# Patient Record
Sex: Female | Born: 1955 | Race: White | Hispanic: No | Marital: Married | Smoking: Never smoker
Health system: Southern US, Community
[De-identification: ages and names within clinical notes are randomized; demographics above are authoritative.]

## PROBLEM LIST (undated history)

## (undated) DIAGNOSIS — F32A Depression, unspecified: Secondary | ICD-10-CM

## (undated) DIAGNOSIS — J449 Chronic obstructive pulmonary disease, unspecified: Secondary | ICD-10-CM

## (undated) DIAGNOSIS — K760 Fatty (change of) liver, not elsewhere classified: Secondary | ICD-10-CM

## (undated) DIAGNOSIS — I10 Essential (primary) hypertension: Secondary | ICD-10-CM

## (undated) DIAGNOSIS — E7800 Pure hypercholesterolemia, unspecified: Secondary | ICD-10-CM

## (undated) DIAGNOSIS — K219 Gastro-esophageal reflux disease without esophagitis: Secondary | ICD-10-CM

## (undated) DIAGNOSIS — M1990 Unspecified osteoarthritis, unspecified site: Secondary | ICD-10-CM

## (undated) DIAGNOSIS — E119 Type 2 diabetes mellitus without complications: Secondary | ICD-10-CM

## (undated) DIAGNOSIS — D649 Anemia, unspecified: Secondary | ICD-10-CM

## (undated) DIAGNOSIS — N1832 Chronic kidney disease, stage 3b: Secondary | ICD-10-CM

## (undated) DIAGNOSIS — R0600 Dyspnea, unspecified: Secondary | ICD-10-CM

## (undated) DIAGNOSIS — R519 Headache, unspecified: Secondary | ICD-10-CM

## (undated) DIAGNOSIS — T7840XA Allergy, unspecified, initial encounter: Secondary | ICD-10-CM

## (undated) DIAGNOSIS — F419 Anxiety disorder, unspecified: Secondary | ICD-10-CM

## (undated) DIAGNOSIS — G4730 Sleep apnea, unspecified: Secondary | ICD-10-CM

## (undated) DIAGNOSIS — R740 Nonspecific elevation of levels of transaminase and lactic acid dehydrogenase [LDH]: Secondary | ICD-10-CM

## (undated) DIAGNOSIS — G4733 Obstructive sleep apnea (adult) (pediatric): Secondary | ICD-10-CM

## (undated) DIAGNOSIS — H269 Unspecified cataract: Secondary | ICD-10-CM

## (undated) DIAGNOSIS — I5189 Other ill-defined heart diseases: Secondary | ICD-10-CM

## (undated) DIAGNOSIS — R748 Abnormal levels of other serum enzymes: Secondary | ICD-10-CM

## (undated) DIAGNOSIS — F329 Major depressive disorder, single episode, unspecified: Secondary | ICD-10-CM

## (undated) HISTORY — PX: TONSILLECTOMY: SUR1361

## (undated) HISTORY — DX: Fatty (change of) liver, not elsewhere classified: K76.0

## (undated) HISTORY — DX: Pure hypercholesterolemia, unspecified: E78.00

## (undated) HISTORY — PX: REPLACEMENT TOTAL KNEE: SUR1224

## (undated) HISTORY — DX: Unspecified osteoarthritis, unspecified site: M19.90

## (undated) HISTORY — DX: Gastro-esophageal reflux disease without esophagitis: K21.9

## (undated) HISTORY — DX: Anxiety disorder, unspecified: F41.9

## (undated) HISTORY — DX: Unspecified cataract: H26.9

## (undated) HISTORY — DX: Chronic obstructive pulmonary disease, unspecified: J44.9

## (undated) HISTORY — DX: Essential (primary) hypertension: I10

## (undated) HISTORY — PX: CARDIAC CATHETERIZATION: SHX172

## (undated) HISTORY — DX: Abnormal levels of other serum enzymes: R74.8

## (undated) HISTORY — PX: TOTAL ABDOMINAL HYSTERECTOMY W/ BILATERAL SALPINGOOPHORECTOMY: SHX83

## (undated) HISTORY — PX: KNEE SURGERY: SHX244

## (undated) HISTORY — PX: TUBAL LIGATION: SHX77

## (undated) HISTORY — DX: Allergy, unspecified, initial encounter: T78.40XA

## (undated) HISTORY — DX: Depression, unspecified: F32.A

## (undated) HISTORY — DX: Sleep apnea, unspecified: G47.30

## (undated) HISTORY — DX: Nonspecific elevation of levels of transaminase and lactic acid dehydrogenase (ldh): R74.0

## (undated) HISTORY — PX: FRACTURE SURGERY: SHX138

## (undated) HISTORY — DX: Major depressive disorder, single episode, unspecified: F32.9

## (undated) HISTORY — PX: ABDOMINAL HYSTERECTOMY: SHX81

---

## 1988-09-25 HISTORY — PX: FRACTURE SURGERY: SHX138

## 2005-07-11 ENCOUNTER — Ambulatory Visit: Payer: Self-pay | Admitting: Internal Medicine

## 2008-08-14 LAB — HM COLONOSCOPY

## 2010-08-24 ENCOUNTER — Ambulatory Visit: Payer: Self-pay | Admitting: Neurology

## 2010-10-04 ENCOUNTER — Ambulatory Visit: Payer: Self-pay | Admitting: Neurology

## 2010-12-05 ENCOUNTER — Ambulatory Visit: Payer: Self-pay | Admitting: Family Medicine

## 2010-12-06 ENCOUNTER — Ambulatory Visit: Payer: Self-pay | Admitting: Nephrology

## 2010-12-25 ENCOUNTER — Ambulatory Visit: Payer: Self-pay | Admitting: Family Medicine

## 2012-03-05 DIAGNOSIS — G2581 Restless legs syndrome: Secondary | ICD-10-CM | POA: Insufficient documentation

## 2012-08-27 DIAGNOSIS — E1169 Type 2 diabetes mellitus with other specified complication: Secondary | ICD-10-CM | POA: Insufficient documentation

## 2012-08-27 DIAGNOSIS — IMO0002 Reserved for concepts with insufficient information to code with codable children: Secondary | ICD-10-CM

## 2012-08-27 DIAGNOSIS — E785 Hyperlipidemia, unspecified: Secondary | ICD-10-CM | POA: Insufficient documentation

## 2012-08-27 HISTORY — DX: Reserved for concepts with insufficient information to code with codable children: IMO0002

## 2012-09-02 ENCOUNTER — Ambulatory Visit: Payer: Self-pay | Admitting: Nurse Practitioner

## 2012-09-04 DIAGNOSIS — K7581 Nonalcoholic steatohepatitis (NASH): Secondary | ICD-10-CM | POA: Insufficient documentation

## 2012-10-07 ENCOUNTER — Other Ambulatory Visit: Payer: Self-pay

## 2012-10-07 LAB — CBC WITH DIFFERENTIAL/PLATELET
Basophil #: 0.1 10*3/uL (ref 0.0–0.1)
Eosinophil #: 0.3 10*3/uL (ref 0.0–0.7)
HCT: 42.3 % (ref 35.0–47.0)
HGB: 13.7 g/dL (ref 12.0–16.0)
Lymphocyte #: 1.6 10*3/uL (ref 1.0–3.6)
Lymphocyte %: 12.5 %
MCH: 29 pg (ref 26.0–34.0)
MCHC: 32.3 g/dL (ref 32.0–36.0)
MCV: 90 fL (ref 80–100)
Monocyte #: 0.6 x10 3/mm (ref 0.2–0.9)
Monocyte %: 4.8 %
Neutrophil #: 10.3 10*3/uL — ABNORMAL HIGH (ref 1.4–6.5)
Neutrophil %: 79.4 %
Platelet: 255 10*3/uL (ref 150–440)
RBC: 4.71 10*6/uL (ref 3.80–5.20)
WBC: 13 10*3/uL — ABNORMAL HIGH (ref 3.6–11.0)

## 2012-10-09 ENCOUNTER — Ambulatory Visit: Payer: Self-pay | Admitting: Nurse Practitioner

## 2012-12-09 ENCOUNTER — Ambulatory Visit: Payer: Self-pay | Admitting: Specialist

## 2012-12-17 ENCOUNTER — Ambulatory Visit: Payer: Self-pay | Admitting: Internal Medicine

## 2013-01-14 ENCOUNTER — Ambulatory Visit: Payer: Self-pay | Admitting: Specialist

## 2013-03-25 DIAGNOSIS — I10 Essential (primary) hypertension: Secondary | ICD-10-CM | POA: Insufficient documentation

## 2013-03-25 DIAGNOSIS — J449 Chronic obstructive pulmonary disease, unspecified: Secondary | ICD-10-CM | POA: Insufficient documentation

## 2013-03-25 DIAGNOSIS — E118 Type 2 diabetes mellitus with unspecified complications: Secondary | ICD-10-CM | POA: Insufficient documentation

## 2013-05-11 ENCOUNTER — Emergency Department: Payer: Self-pay | Admitting: Emergency Medicine

## 2013-05-11 LAB — BASIC METABOLIC PANEL
Anion Gap: 5 — ABNORMAL LOW (ref 7–16)
Calcium, Total: 9.4 mg/dL (ref 8.5–10.1)
Chloride: 105 mmol/L (ref 98–107)
Co2: 29 mmol/L (ref 21–32)
EGFR (African American): >60
EGFR (Non-African Amer.): >60
Osmolality: 281 (ref 275–301)
Potassium: 4.6 mmol/L (ref 3.5–5.1)
Sodium: 139 mmol/L (ref 136–145)

## 2013-05-11 LAB — CBC
HCT: 39.9 % (ref 35.0–47.0)
MCV: 89 fL (ref 80–100)
RBC: 4.49 10*6/uL (ref 3.80–5.20)
RDW: 14.1 % (ref 11.5–14.5)

## 2013-06-16 DIAGNOSIS — F324 Major depressive disorder, single episode, in partial remission: Secondary | ICD-10-CM | POA: Insufficient documentation

## 2014-06-25 ENCOUNTER — Ambulatory Visit: Payer: Self-pay | Admitting: Cardiology

## 2014-07-03 ENCOUNTER — Ambulatory Visit: Payer: Self-pay | Admitting: Specialist

## 2014-07-03 ENCOUNTER — Ambulatory Visit: Payer: Self-pay | Admitting: Cardiology

## 2014-07-15 ENCOUNTER — Ambulatory Visit: Payer: Self-pay | Admitting: Cardiology

## 2014-07-31 DIAGNOSIS — Z9889 Other specified postprocedural states: Secondary | ICD-10-CM | POA: Insufficient documentation

## 2014-09-03 DIAGNOSIS — R748 Abnormal levels of other serum enzymes: Secondary | ICD-10-CM

## 2014-09-03 HISTORY — DX: Abnormal levels of other serum enzymes: R74.8

## 2014-09-30 ENCOUNTER — Ambulatory Visit: Payer: Self-pay | Admitting: Internal Medicine

## 2014-12-09 DIAGNOSIS — N183 Chronic kidney disease, stage 3 unspecified: Secondary | ICD-10-CM | POA: Insufficient documentation

## 2014-12-16 ENCOUNTER — Ambulatory Visit: Payer: Self-pay | Admitting: Neurology

## 2014-12-30 DIAGNOSIS — G629 Polyneuropathy, unspecified: Secondary | ICD-10-CM | POA: Insufficient documentation

## 2015-01-15 NOTE — Op Note (Signed)
PATIENT NAME:  Angela Mullen, Angela Mullen MR#:  [id number] DATE OF BIRTH:  [birth year]  DATE OF PROCEDURE:  01/14/2013  PREOPERATIVE DIAGNOSES: 1.  Medial meniscus tear, left knee.  2.  Degenerative arthritis, medial compartment, left knee.   POSTOPERATIVE DIAGNOSES: 1.  Medial meniscus tear, left knee.  2.  Degenerative arthritis, medial compartment, left knee.  3.  Cartilaginous loose body intercondylar notch.   PROCEDURE:  Arthroscopic partial medial meniscectomy, left knee.   SURGEON:  Christophe Louis, M.D.   ANESTHESIA:  General.   COMPLICATIONS:  None.   PROCEDURE:  After adequate induction of general anesthesia, the left lower leg is placed in the legholder in the usual manner for arthroscopy.  The left knee and leg are thoroughly prepped with alcohol and ChloraPrep and draped in standard sterile fashion.  Diagnostic arthroscopy is performed after infiltrating the knee with 10 mL of Marcaine with epinephrine.  There is seen to be a joint effusion present.  The patellofemoral articulation is within normal limits for her age.  There is minimal chondromalacia.  At the margin of the medial femoral condyle there is a hypertrophic bone spur consistent with degenerative arthritis.  In the medial compartment, there is a moderate chondromalacia of the medial femoral condyle associated with a radial tear in the posterior horn of the medial meniscus.  Using a combination of the full radial resector and the ArthroWand, the torn portion of the meniscus was resected posteriorly.  In the intercondylar notch, there was a moderate amount of synovial hypertrophy along with an 8 mm in diameter cartilaginous loose body.  Using the full resector and the ArthroWand this was all removed.  The anterior cruciate ligament is seen to be normal.  The lateral compartment is normal.  The joint is thoroughly irrigated multiple times.  Skin edges are closed with 5-0 nylon.  The joint is infiltrated with 10 mL of Marcaine, 4 mg of  morphine and 1 mL of Depo-Medrol.  A soft bulky dressing is applied.  The patient is returned to the recovery room in satisfactory condition having tolerated the procedure quite well.    ____________________________ Lucas Mallow, MD ces:ea D: 01/15/2013 06:08:58 ET T: 01/15/2013 06:16:57 ET JOB#: [id number]  cc: Lucas Mallow, MD, <Dictator> Lucas Mallow MD ELECTRONICALLY SIGNED 01/17/2013 8:34

## 2015-07-14 ENCOUNTER — Ambulatory Visit: Payer: Self-pay | Admitting: Psychiatry

## 2015-08-19 DIAGNOSIS — M159 Polyosteoarthritis, unspecified: Secondary | ICD-10-CM | POA: Insufficient documentation

## 2015-11-09 ENCOUNTER — Other Ambulatory Visit: Payer: Self-pay | Admitting: Internal Medicine

## 2015-11-09 DIAGNOSIS — R748 Abnormal levels of other serum enzymes: Secondary | ICD-10-CM

## 2015-11-09 DIAGNOSIS — G4700 Insomnia, unspecified: Secondary | ICD-10-CM | POA: Insufficient documentation

## 2015-11-15 ENCOUNTER — Ambulatory Visit
Admission: RE | Admit: 2015-11-15 | Discharge: 2015-11-15 | Disposition: A | Discharge status: Home / Self Care | Payer: 59 | Source: Ambulatory Visit | Attending: Internal Medicine | Admitting: Internal Medicine

## 2015-11-15 DIAGNOSIS — R748 Abnormal levels of other serum enzymes: Secondary | ICD-10-CM | POA: Diagnosis present

## 2015-11-15 DIAGNOSIS — K760 Fatty (change of) liver, not elsewhere classified: Secondary | ICD-10-CM | POA: Diagnosis not present

## 2015-12-17 ENCOUNTER — Ambulatory Visit: Payer: 59 | Admitting: Licensed Clinical Social Worker

## 2015-12-29 ENCOUNTER — Encounter: Payer: Self-pay | Admitting: Licensed Clinical Social Worker

## 2015-12-29 ENCOUNTER — Other Ambulatory Visit: Payer: Self-pay

## 2015-12-29 ENCOUNTER — Ambulatory Visit (INDEPENDENT_AMBULATORY_CARE_PROVIDER_SITE_OTHER): Payer: 59 | Admitting: Licensed Clinical Social Worker

## 2015-12-29 DIAGNOSIS — F411 Generalized anxiety disorder: Secondary | ICD-10-CM | POA: Diagnosis not present

## 2015-12-29 DIAGNOSIS — G4733 Obstructive sleep apnea (adult) (pediatric): Secondary | ICD-10-CM | POA: Insufficient documentation

## 2015-12-29 DIAGNOSIS — F332 Major depressive disorder, recurrent severe without psychotic features: Secondary | ICD-10-CM | POA: Diagnosis not present

## 2015-12-29 DIAGNOSIS — M1990 Unspecified osteoarthritis, unspecified site: Secondary | ICD-10-CM | POA: Insufficient documentation

## 2015-12-29 DIAGNOSIS — K219 Gastro-esophageal reflux disease without esophagitis: Secondary | ICD-10-CM | POA: Insufficient documentation

## 2015-12-29 NOTE — Progress Notes (Signed)
Comprehensive Clinical Assessment (CCA) Note  12/29/2015 [name] [id number]  Visit Diagnosis:      ICD-9-CM ICD-10-CM   1. Severe episode of recurrent major depressive disorder, without psychotic features ([place]) 296.33 F33.2   2. Generalized anxiety disorder 300.02 F41.1       CCA Part One  Part One has been completed on paper by the patient.  (See scanned document in Chart Review)  CCA Part Two A  Intake/Chief Complaint:  CCA Intake With Chief Complaint CCA Part Two Date: 12/29/15 CCA Part Two Time: 0838 Chief Complaint/Presenting Problem: Periodically, through her life she has dealt with depression. This past November she lost her sister a year before. She is the youngest and a large gap between her and older siblings. her older sister was like a mother to her. when she passed she went through the motions. She hadn't grieved but it started to affect her last July. About September and October she couldn't get out of bed and crying. She would try to leave and have a panic attack and couldn't get out the door. She was put on Zoloft, Dr. Lucio Edward added the Wellbutrin and it seemed to help. Amonst this she put on 40-50 punds. She is waking up at night at 2-3 AM. Vyvanse was given and iit helps with the binge eating . Dr. Nicolasa Ducking gave it to her.  Patients Currently Reported Symptoms/Problems: She wants to cry, sad and not sure why crying.  Collateral Involvement: husband and son Individual's Strengths: right now she doesn't feel like she has any. Therapist pointed out that she is resilient to handle all these losses. she said she is better than December. She is a little more active, but she doesn't want to go out. When she does she gets tearful and have to leave.  Individual's Preferences: Counseling, medication management Individual's Abilities: Used to like gardening, to read, like to go to the beach, she loves her grandchildren, she has eight grandchildren.  Type of Services Patient Feels  Are Needed: counseling for grief and depression.  Initial Clinical Notes/Concerns: The loss of mom and dad. She thinks that it hit her all the same time. She lost her brother in [place]. Her mom had a cerebral hemorrage. "All of these losses have been unexpected". Her psych history-She started to see Dr. Nicolasa Ducking in December, 2016. She started getting antidepressants from her PCP for about 20 years. She would go back on and off. Prozac, Wellbutrin, Amitripaline.   Mental Health Symptoms Depression:  Depression: Change in energy/activity, Difficulty Concentrating, Fatigue, Hopelessness, Increase/decrease in appetite, Irritability, Tearfulness, Weight gain/loss, Sleep (too much or little), Worthlessness (She didn't want to get out of bed, still waking up. she is sleeping. she is having dreams, wakes up and seem real and stay with her for a couple of days. denies SI, she questions why she is here, denies past SA, SIB. )  Mania:  Mania:  (phases of increased energy, it it like that once a month)  Anxiety:   Anxiety: Difficulty concentrating, Fatigue, Irritability, Restlessness, Sleep, Tension, Worrying (Worry daily, everything, inferes with functioning. Panic attacks-break out crying out of nowhere and can't stop)  Psychosis:  Psychosis:  (She thinks something flashes across her sight and she thinks she saw something. )  Trauma:  Trauma: N/A  Obsessions:  Obsessions: N/A  Compulsions:  Compulsions: N/A  Inattention:  Inattention: N/A  Hyperactivity/Impulsivity:  Hyperactivity/Impulsivity: N/A  Oppositional/Defiant Behaviors:  Oppositional/Defiant Behaviors: N/A  Borderline Personality:  Emotional Irregularity: N/A  Other  Mood/Personality Symptoms:  Other Mood/Personality Symtpoms: she cycles monthly feeling more depressed related to when she had her period even though she had a hysterectomy.    Mental Status Exam Appearance and self-care  Stature:  Stature: Average  Weight:  Weight: Obese  Clothing:   Clothing: Casual  Grooming:  Grooming: Normal  Cosmetic use:  Cosmetic Use: None  Posture/gait:  Posture/Gait: Normal  Motor activity:  Motor Activity: Not Remarkable  Sensorium  Attention:  Attention: Normal  Concentration:  Concentration: Normal  Orientation:  Orientation: Person, Object, Place, Situation, Time  Recall/memory:  Recall/Memory: Normal  Affect and Mood  Affect:  Affect: Depressed  Mood:  Mood: Depressed  Relating  Eye contact:  Eye Contact: Normal  Facial expression:  Facial Expression: Depressed, Sad  Attitude toward examiner:  Attitude Toward Examiner: Cooperative  Thought and Language  Speech flow: Speech Flow: Normal  Thought content:  Thought Content: Appropriate to mood and circumstances  Preoccupation:     Hallucinations:     Organization:     Transport planner of Knowledge:  Fund of Knowledge: Average  Intelligence:  Intelligence: Average  Abstraction:  Abstraction: Normal  Judgement:  Judgement: Fair  Art therapist:  Reality Testing: Realistic  Insight:  Insight: Fair  Decision Making:  Decision Making: Paralyzed  Social Functioning  Social Maturity:  Social Maturity: Responsible, Isolates  Social Judgement:  Social Judgement: Normal  Stress  Stressors:  Stressors: Grief/losses  Coping Ability:  Coping Ability: Deficient supports, Theatre stage manager, English as a second language teacher Deficits:     Supports:      Family and Psychosocial History: Family history Marital status: Married Number of Years Married: 66 What types of issues is patient dealing with in the relationship?: He is supportive right now but he wasn't before. The way he looks at things. He tells him things and it doesn't seem like it registers. He thinks that what he thinks other people should think. She looked at things from his perspective but now realizing she needs to have own perspective. She wants people to listen to her and respect her opinion.   Are you sexually active?: No What is your  sexual orientation?: heterosexual Has your sexual activity been affected by drugs, alcohol, medication, or emotional stress?: emotional stress Does patient have children?: Yes How many children?: 1 How is patient's relationship with their children?: Son is 74 and two stepsons who are 61 and 63. Jason-49 stressed, Robbie 46-good kid, with her son they have had their ups and downs mostly from daughter-in-law.    Childhood History:  Childhood History By whom was/is the patient raised?: Both parents Additional childhood history information: She had a good childhood. She lost her mom at 78.  Description of patient's relationship with caregiver when they were a child: She was the baby. She felt like they did more with parents than brothers and sisters because patient was so much younger than older brothers and sisters.  Patient's description of current relationship with people who raised him/her: her mom and dad passed. The only person left is her brother  How were you disciplined when you got in trouble as a child/adolescent?:  (She was called by her middle name. Her mom got her switch. Her dad never whooped her. She didn't get in a lot of trouble. ) Does patient have siblings?: Yes Number of Siblings: 3 Description of patient's current relationship with siblings: 1 surviving brother, he has been there but he has his own problems. this is one of her  worries. She lost one brother and sister. Her brothers were twins.  Did patient suffer any verbal/emotional/physical/sexual abuse as a child?: No Did patient suffer from severe childhood neglect?: No Has patient ever been sexually abused/assaulted/raped as an adolescent or adult?: No Was the patient ever a victim of a crime or a disaster?: No Witnessed domestic violence?: No Has patient been effected by domestic violence as an adult?: No  CCA Part Two B  Employment/Work Situation: Employment / Work Situation Employment situation: Unemployed (Past  ten years she hasn't worked. She kept the grandchildren, helping out with the kids. 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 ) Patient's job has been impacted by current illness: Yes (Up to last year active, but this year hasn't done the things and hasn't been there for them. ) What is the longest time patient has a held a job?: 7 Where was the patient employed at that time?:  Day care, data entry shipping clerk-[employer], [employer], [employer] Has patient ever been in the [employer]?: No Has patient ever served in combat?: No Did You Receive Any Psychiatric Treatment/Services While in Passenger transport manager?: No Are There Guns or Other Weapons in [place]?: Yes Types of Guns/Weapons: handgun Are These Psychologist, educational?: Yes  Education: Education School Currently Attending: no Last Grade Completed: 13.5 Name of [place]: [place] Did Teacher, adult education From [employer]?: Yes Did [place]?: Yes (a year and a half of college) What Type of College Degree Do you Have?: certificate-sauder Did [place]?: No Did You Have Any Special Interests In School?: accountant Did You Have An Individualized Education Program (IIEP): No Did You Have Any Difficulty At School?: No  Religion: Religion/Spirituality Are You A Religious Person?: Yes What is Your Religious Affiliation?: Christian How Might This Affect Treatment?: none  Leisure/Recreation: Leisure / Recreation Leisure and Hobbies: gardening, reading, likes the beach  Exercise/Diet: Exercise/Diet Do You Exercise?: No Have You Gained or Lost A Significant Amount of Weight in the Past Six Months?: Yes-Gained Number of Pounds Gained: 30 Do You Follow a Special Diet?: No Type of Diet: cut back on carbs, drink more water, stay away from soda, starting having a smoothie and then having a meal. Try to eat more fruit.  Do You Have Any Trouble Sleeping?: Yes Explanation  of Sleeping Difficulties: waking up in the night, nightmares, sometimes she has trouble getting to sleep, Sleep Apnea  CCA Part Two C  Alcohol/Drug Use: Alcohol / Drug Use Pain Medications: see med list-Methocarbamol, Prescriptions: see med list Over the Counter: see med list History of alcohol / drug use?: No history of alcohol / drug abuse                      CCA Part Three  ASAM's:  Six Dimensions of Multidimensional Assessment  Dimension 1:  Acute Intoxication and/or Withdrawal Potential:     Dimension 2:  Biomedical Conditions and Complications:     Dimension 3:  Emotional, Behavioral, or Cognitive Conditions and Complications:     Dimension 4:  Readiness to Change:     Dimension 5:  Relapse, Continued use, or Continued Problem Potential:     Dimension 6:  Recovery/Living Environment:      Substance use Disorder (SUD)    Social Function:  Social Functioning Social Maturity: Responsible, Isolates Social Judgement: Normal  Stress:  Stress Stressors: Grief/losses Coping Ability: Deficient  supports, Exhausted, Overwhelmed Patient Takes Medications The Way The Doctor Instructed?: Yes Priority Risk: Low Acuity  Risk Assessment- Self-Harm Potential: Risk Assessment For Self-Harm Potential Thoughts of Self-Harm: No current thoughts Method: No plan Availability of Means: Have close by  Risk Assessment -Dangerous to Others Potential: Risk Assessment For Dangerous to Others Potential Method: No Plan Availability of Means: Has close by Intent: Vague intent or NA Notification Required: No need or identified person  DSM5 Diagnoses: Patient Active Problem List   Diagnosis Date Noted  . Anxiety 12/29/2015  . Arthritis 12/29/2015  . Acid reflux 12/29/2015  . BP (high blood pressure) 12/29/2015  . Obstructive apnea 12/29/2015  . Generalized anxiety disorder 12/29/2015  . Insomnia, persistent 11/09/2015  . Clinical depression 08/19/2015  . Generalized OA  08/19/2015  . Leg paresthesia 12/30/2014  . Polyneuropathy ([place]) 12/30/2014  . Paraparesis ([place]) 12/30/2014  . Benign essential HTN 12/09/2014  . Chronic kidney disease (CKD), stage III (moderate) 12/09/2014  . Abnormal liver enzymes 09/03/2014  . Morbid (severe) obesity due to excess calories ([place]) 09/03/2014  . History of cardiac catheterization 07/31/2014  . Chest pain 06/10/2014  . Breathlessness on exertion 06/10/2014  . Anxiety and depression 06/16/2013  . Dysthymia 06/16/2013  . Chronic obstructive pulmonary disease ([place]) 03/25/2013  . Fatty infiltration of liver 03/25/2013  . Fatty liver disease, nonalcoholic 08/65/7846  . Essential (primary) hypertension 03/25/2013  . Adiposity 03/25/2013  . Encounter for general adult medical examination without abnormal findings 03/25/2013  . Chronic nonalcoholic liver disease 96/29/5284  . Controlled type 2 diabetes mellitus without complication ([place]) 13/24/4010  . Difficult or painful urination 10/07/2012  . Renal tract candidiasis 10/07/2012  . NASH (nonalcoholic steatohepatitis) 09/04/2012  . Flu vaccine need 09/04/2012  . Need for vaccination 09/04/2012  . Elevation of level of transaminase or lactic acid dehydrogenase (LDH) 08/27/2012  . HLD (hyperlipidemia) 08/27/2012  . Chest discomfort 08/13/2012  . Diabetes mellitus ([place]) 08/13/2012  . Persons encountering health services in other specified circumstances 08/13/2012  . Left leg pain 08/13/2012  . Infection of urinary tract 08/13/2012  . Restless leg 03/05/2012  . Type 2 diabetes mellitus ([place]) 04/19/2011    Patient Centered Plan: Patient is on the following Treatment Plan(s):  Anxiety and Depression, Grief issues.   Recommendations for Services/Supports/Treatments: Recommendations for Services/Supports/Treatments Recommendations For Services/Supports/Treatments: Individual Therapy, Medication Management  Treatment Plan Summary: Patient is a 60 year old married female  who presents because of grief and depression. She relates that she has had multiple losses and has not properly grieved. These losses have included her sister, who was like a mom to her, her parents, a 75 year old granddaughter and a brother. She was tearful in the interview and sad that she was cry out of blue, she is not motivated to go anywhere, has problems with sleep, overeating, gained a lot of weight, fatigue, worthlessness, hopeless and she sees similarities between her and her sister and is seeking help to prevent her symptoms from getting worse. She does not endorse SI or past SI or SA. She denies SIB. She endorses worry daily about everything, restlessness, tension, fatigue, poor concentration, irritability, and sleep problems. She would benefit from regular therapy session to help give her support, work on healthy coping strategies and work on grief issues. She also would benefit from medication management.      Referrals to Alternative Service(s): Referred to Alternative Service(s):   Place:   Date:   Time:    Referred to Alternative  Service(s):   Place:   Date:   Time:    Referred to Alternative Service(s):   Place:   Date:   Time:    Referred to Alternative Service(s):   Place:   Date:   Time:     Bowman,Mary A

## 2016-01-04 ENCOUNTER — Ambulatory Visit: Payer: 59 | Admitting: Licensed Clinical Social Worker

## 2016-01-05 ENCOUNTER — Ambulatory Visit: Payer: 59 | Admitting: Licensed Clinical Social Worker

## 2016-01-11 ENCOUNTER — Ambulatory Visit: Payer: 59 | Admitting: Licensed Clinical Social Worker

## 2016-01-13 ENCOUNTER — Other Ambulatory Visit: Payer: Self-pay | Admitting: Nurse Practitioner

## 2016-01-13 DIAGNOSIS — R7989 Other specified abnormal findings of blood chemistry: Secondary | ICD-10-CM

## 2016-01-13 DIAGNOSIS — K760 Fatty (change of) liver, not elsewhere classified: Secondary | ICD-10-CM

## 2016-01-13 DIAGNOSIS — R160 Hepatomegaly, not elsewhere classified: Secondary | ICD-10-CM

## 2016-01-13 DIAGNOSIS — R945 Abnormal results of liver function studies: Secondary | ICD-10-CM

## 2016-01-17 ENCOUNTER — Ambulatory Visit: Payer: 59

## 2016-01-17 ENCOUNTER — Ambulatory Visit
Admission: RE | Admit: 2016-01-17 | Discharge: 2016-01-17 | Disposition: A | Discharge status: Home / Self Care | Payer: 59 | Source: Ambulatory Visit | Attending: Nurse Practitioner | Admitting: Nurse Practitioner

## 2016-01-17 DIAGNOSIS — I700 Atherosclerosis of aorta: Secondary | ICD-10-CM | POA: Insufficient documentation

## 2016-01-17 DIAGNOSIS — R1011 Right upper quadrant pain: Secondary | ICD-10-CM | POA: Diagnosis present

## 2016-01-17 DIAGNOSIS — R160 Hepatomegaly, not elsewhere classified: Secondary | ICD-10-CM | POA: Diagnosis not present

## 2016-01-17 DIAGNOSIS — K760 Fatty (change of) liver, not elsewhere classified: Secondary | ICD-10-CM | POA: Insufficient documentation

## 2016-01-17 DIAGNOSIS — R945 Abnormal results of liver function studies: Secondary | ICD-10-CM

## 2016-01-17 DIAGNOSIS — R7989 Other specified abnormal findings of blood chemistry: Secondary | ICD-10-CM

## 2016-01-17 MED ORDER — IOHEXOL 300 MG/ML  SOLN
100.0000 mL | Freq: Once | INTRAMUSCULAR | Status: AC | PRN
  Administered 2016-01-17: 100 mL via INTRAVENOUS

## 2016-01-18 ENCOUNTER — Ambulatory Visit: Payer: 59 | Admitting: Psychiatry

## 2016-01-19 ENCOUNTER — Ambulatory Visit
Admission: RE | Admit: 2016-01-19 | Discharge: 2016-01-19 | Disposition: A | Discharge status: Home / Self Care | Payer: 59 | Source: Ambulatory Visit | Attending: Nurse Practitioner | Admitting: Nurse Practitioner

## 2016-01-19 DIAGNOSIS — R7989 Other specified abnormal findings of blood chemistry: Secondary | ICD-10-CM | POA: Diagnosis present

## 2016-01-19 DIAGNOSIS — K760 Fatty (change of) liver, not elsewhere classified: Secondary | ICD-10-CM

## 2016-01-19 DIAGNOSIS — R945 Abnormal results of liver function studies: Secondary | ICD-10-CM

## 2016-01-19 DIAGNOSIS — R160 Hepatomegaly, not elsewhere classified: Secondary | ICD-10-CM

## 2016-01-20 ENCOUNTER — Ambulatory Visit: Payer: 59

## 2016-03-02 ENCOUNTER — Other Ambulatory Visit: Payer: Self-pay | Admitting: Nurse Practitioner

## 2016-03-02 DIAGNOSIS — K7460 Unspecified cirrhosis of liver: Secondary | ICD-10-CM

## 2016-03-02 DIAGNOSIS — K529 Noninfective gastroenteritis and colitis, unspecified: Secondary | ICD-10-CM

## 2016-03-02 DIAGNOSIS — R1319 Other dysphagia: Secondary | ICD-10-CM

## 2016-03-23 ENCOUNTER — Ambulatory Visit: Payer: 59

## 2016-03-31 ENCOUNTER — Ambulatory Visit: Payer: 59 | Admitting: Internal Medicine

## 2016-04-07 ENCOUNTER — Ambulatory Visit: Payer: 59 | Admitting: Dietician

## 2016-04-11 ENCOUNTER — Encounter: Admission: RE | Payer: Self-pay | Source: Ambulatory Visit

## 2016-04-11 ENCOUNTER — Ambulatory Visit: Admission: RE | Admit: 2016-04-11 | Payer: 59 | Source: Ambulatory Visit | Admitting: Gastroenterology

## 2016-04-11 SURGERY — ESOPHAGOGASTRODUODENOSCOPY (EGD) WITH PROPOFOL
Anesthesia: General

## 2016-04-18 ENCOUNTER — Ambulatory Visit: Payer: 59 | Admitting: Dietician

## 2016-07-11 ENCOUNTER — Ambulatory Visit: Admit: 2016-07-11 | Payer: 59 | Admitting: Gastroenterology

## 2016-07-11 SURGERY — ESOPHAGOGASTRODUODENOSCOPY (EGD) WITH PROPOFOL
Anesthesia: General

## 2016-12-07 ENCOUNTER — Other Ambulatory Visit: Payer: Self-pay | Admitting: Nurse Practitioner

## 2016-12-07 DIAGNOSIS — Z1231 Encounter for screening mammogram for malignant neoplasm of breast: Secondary | ICD-10-CM

## 2017-01-03 ENCOUNTER — Inpatient Hospital Stay: Admission: RE | Admit: 2017-01-03 | Payer: 59 | Source: Ambulatory Visit

## 2017-01-04 ENCOUNTER — Ambulatory Visit: Payer: 59

## 2017-01-08 ENCOUNTER — Ambulatory Visit: Payer: 59

## 2017-02-15 IMAGING — CT CT ABD-PELV W/ CM
2 of 5 series · 16 of 46 positions shown, 18 images · IV contrast (omnipaque)
Comparison: Ultrasound 11/15/2015

CLINICAL DATA: Right upper quadrant pain for 1 year.

EXAM:
CT ABDOMEN AND PELVIS WITH CONTRAST
TECHNIQUE: Multidetector CT imaging of the abdomen and pelvis was performed
using the standard protocol following bolus administration of
intravenous contrast.
CONTRAST:  100mL OMNIPAQUE IOHEXOL 300 MG/ML  SOLN

[Series 2: axial soft tissue · axial · 0.88mm/px · z∈[-831,-396]mm · 13 of 99 slices shown, 15 images]
[im 6/99  soft-tissue]
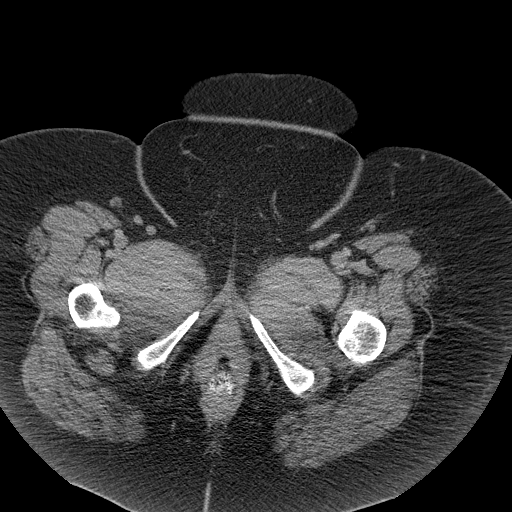
[im 6/99  bone]
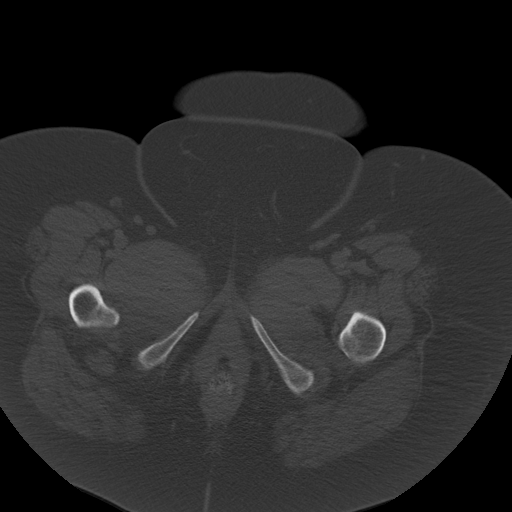
[im 16/99  soft-tissue]
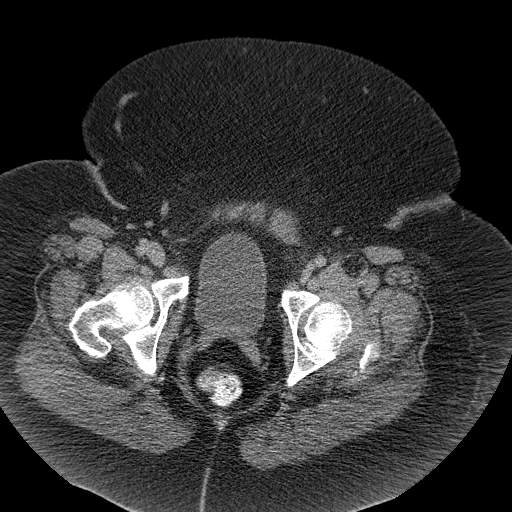
[im 21/99  soft-tissue]
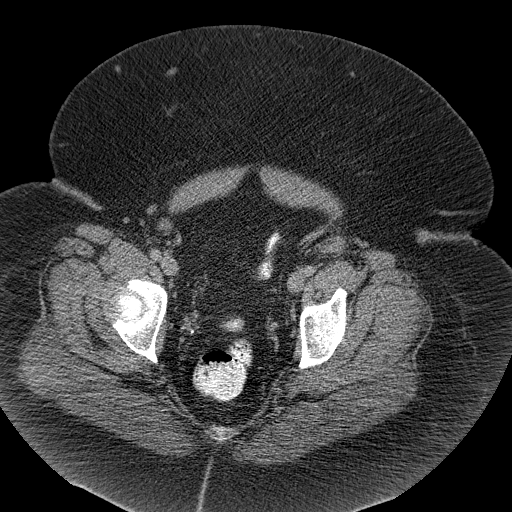
[im 26/99  soft-tissue]
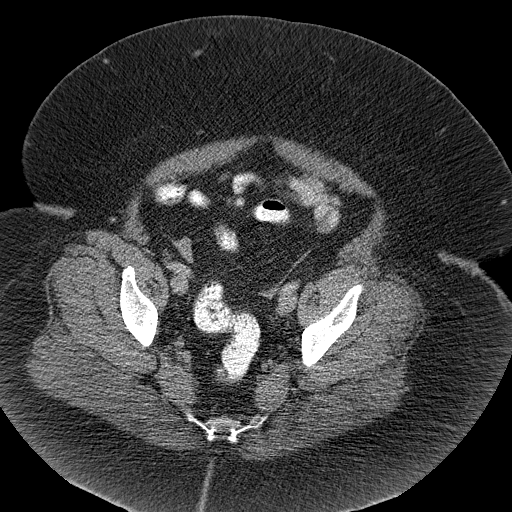
[im 37/99  soft-tissue]
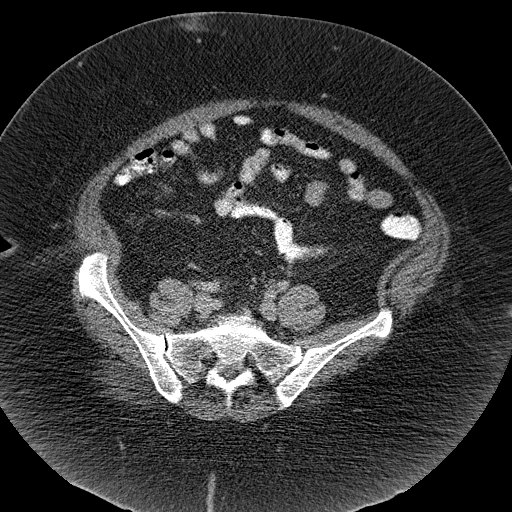
[im 42/99  soft-tissue]
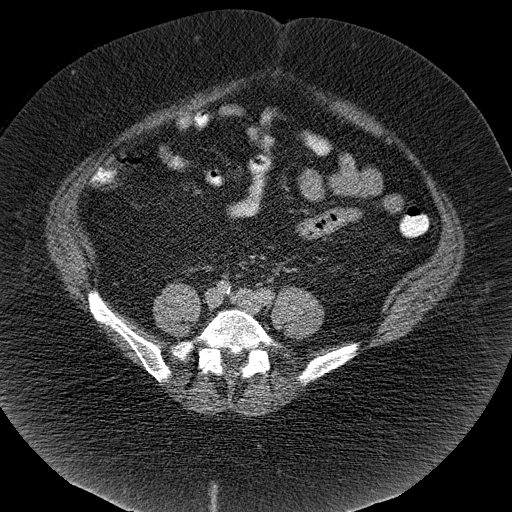
[im 52/99  soft-tissue]
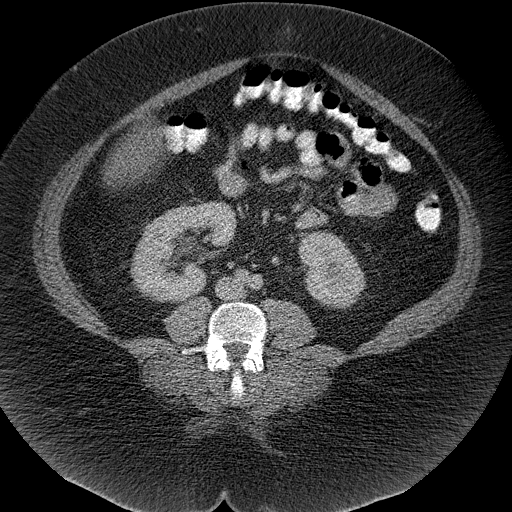
[im 57/99  soft-tissue]
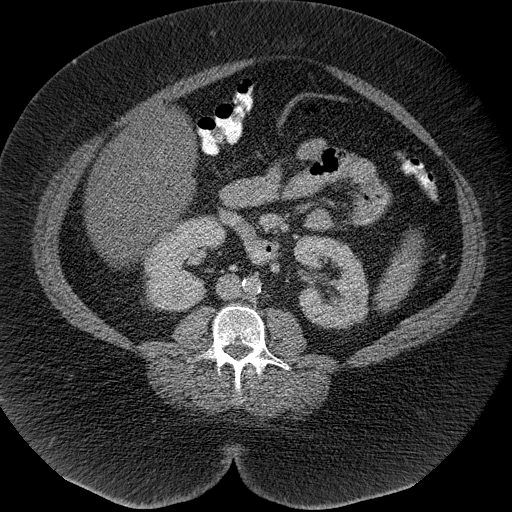
[im 62/99  soft-tissue]
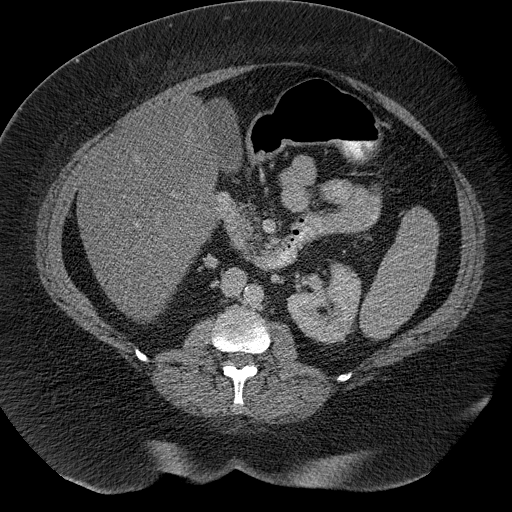
[im 62/99  bone]
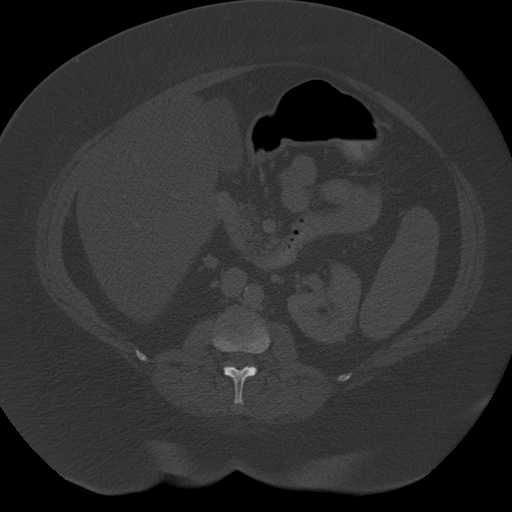
[im 73/99  soft-tissue]
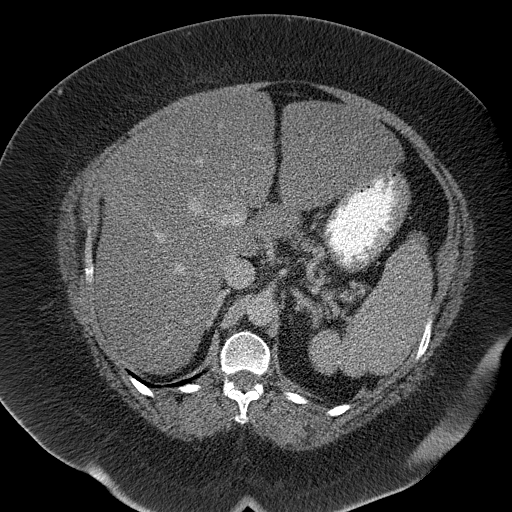
[im 78/99  soft-tissue]
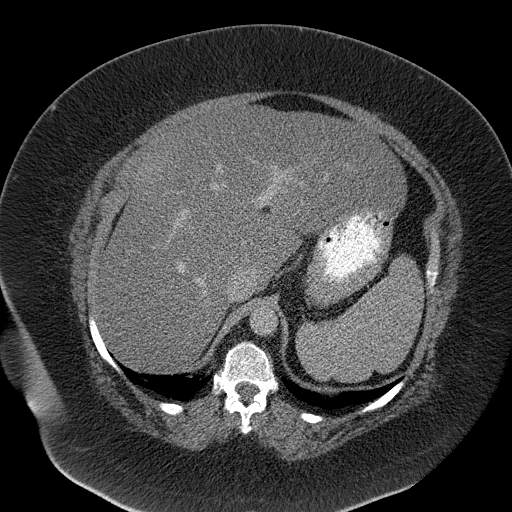
[im 83/99  soft-tissue]
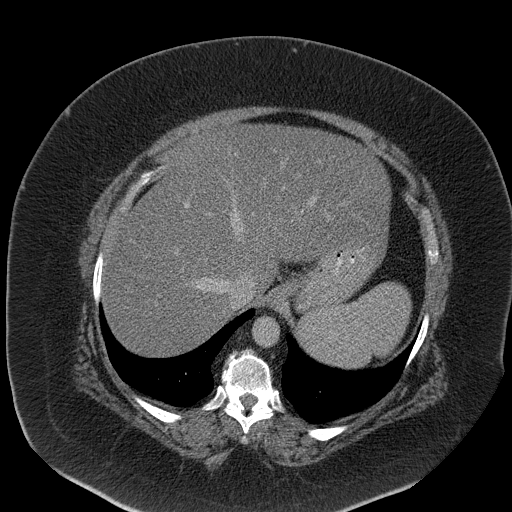
[im 93/99  soft-tissue]
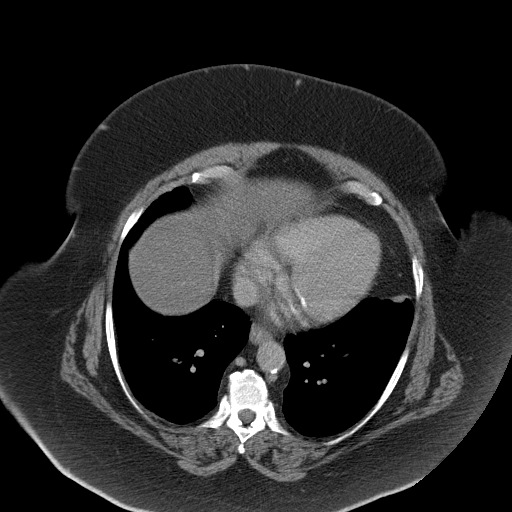

[Series 602: coronal · coronal · 0.98mm/px · 3 of 139 slices shown]
[im 47/139  soft-tissue]
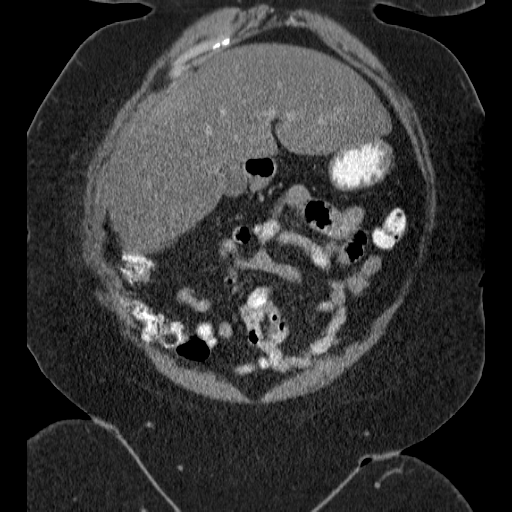
[im 62/139  soft-tissue]
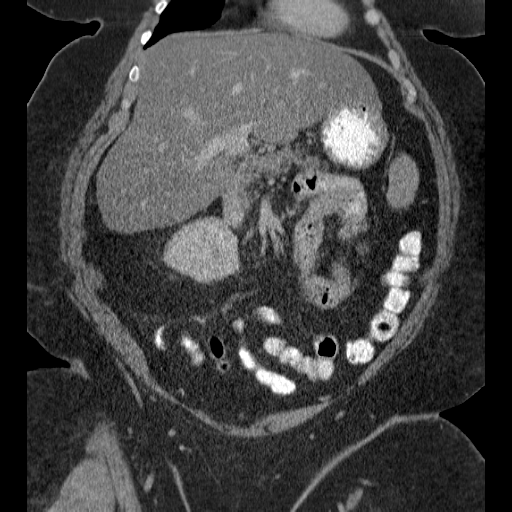
[im 77/139  soft-tissue]
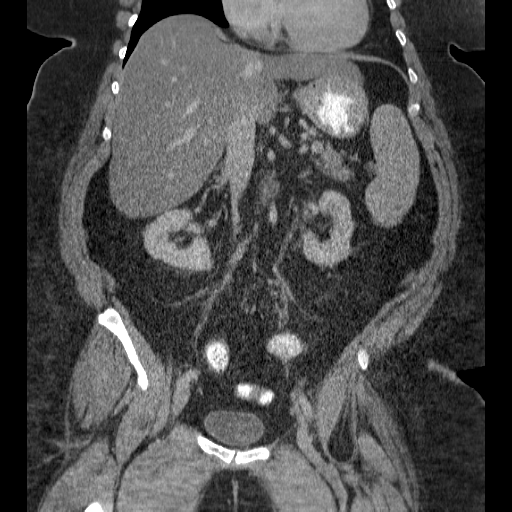

[16 of 46 positions shown; findings below may reference images not displayed]

FINDINGS: Lower chest: The lung bases are clear. No pleural or pericardial
effusion noted.

Hepatobiliary: Hepatic steatosis is noted. There is hypertrophy of
the lateral segment and caudate lobe of liver. No focal liver
abnormality. The gallbladder is negative.

Pancreas: 2 small calcifications are noted within the head of
pancreas. No inflammation or mass identified.

Spleen: Within normal limits in size and appearance.

Adrenals/Urinary Tract: Normal adrenal glands. No kidney stones
identified. No hydronephrosis. The urinary bladder appears normal.

Stomach/Bowel: No evidence of obstruction, inflammatory process, or
abnormal fluid collections.

Vascular/Lymphatic: Calcified atherosclerotic disease involves the
abdominal aorta. No aneurysm. No enlarged retroperitoneal or
mesenteric adenopathy. No enlarged pelvic or inguinal lymph nodes.

Reproductive: Previous hysterectomy.  No adnexal mass.

Other: There is no ascites or focal fluid collections within the
abdomen or pelvis. There is a periumbilical hernia identified which
contains fat only.

Musculoskeletal: Degenerative disc disease is identified at the
L5-S1 level.
IMPRESSION: 1. No acute findings within the abdomen or pelvis.
2. Hepatic steatosis. Hypertrophy of the lateral segment of left
lobe and caudate lobe of liver noted. Correlate for any clinical
signs or symptoms of early cirrhosis.
3. There are 2 small calcifications identified within the head of
pancreas. This is a nonspecific finding but may be seen with chronic
pancreatitis.
4. Aortic atherosclerosis.

## 2017-02-17 IMAGING — US US LIVER ELASTOGRAPHY
1 series · 13 of 13 positions shown · non-contrast
Comparison: 01/17/2016 CT abdomen/ pelvis. 11/15/2015 abdominal
sonogram.

CLINICAL DATA: Enlarged liver. Hepatic steatosis. Abnormal liver
function tests.

EXAM:
ULTRASOUND HEPATIC ELASTOGRAPHY
TECHNIQUE: Ultrasound elastography evaluation of the liver was performed. A
region of interest was placed in the right lobe of the liver.
Following application of a compressive sonographic pulse, shear
waves were detected in the adjacent hepatic tissue and the shear
wave velocity was calculated. Multiple assessments were performed at
the selected site. Median shear wave velocity is correlated to a
Metavir fibrosis score.

[Series 1: us liver elastography · 0.27mm/px · 13 of 13 slices shown]
[im 1/13]
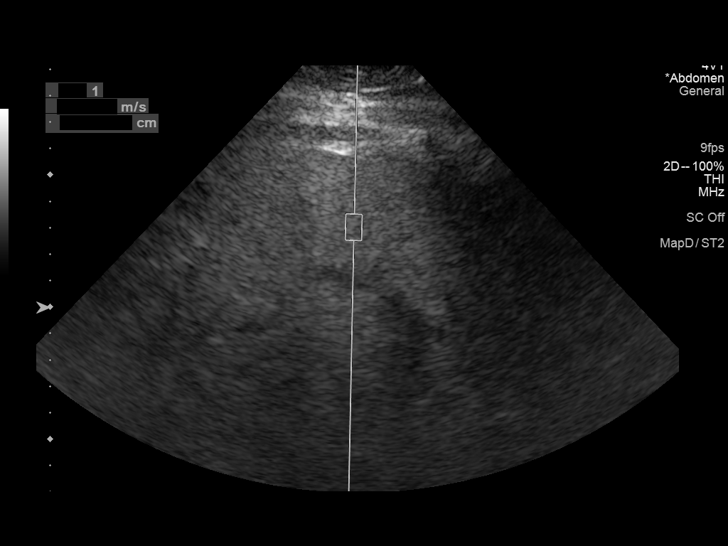
[im 2/13]
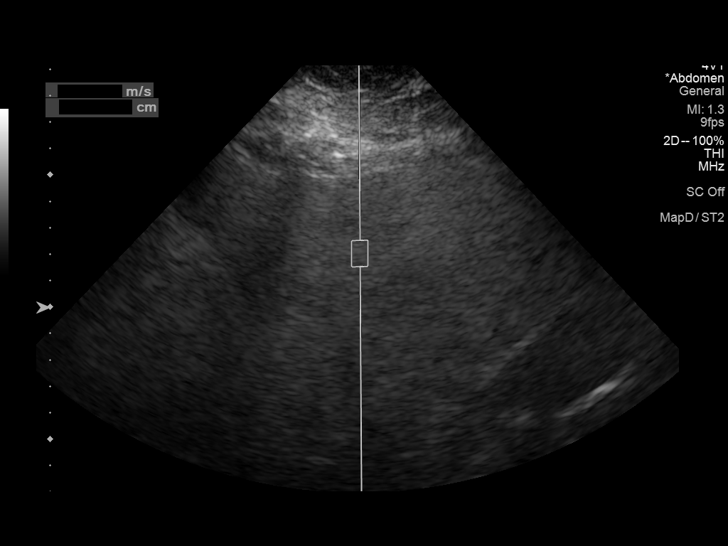
[im 3/13]
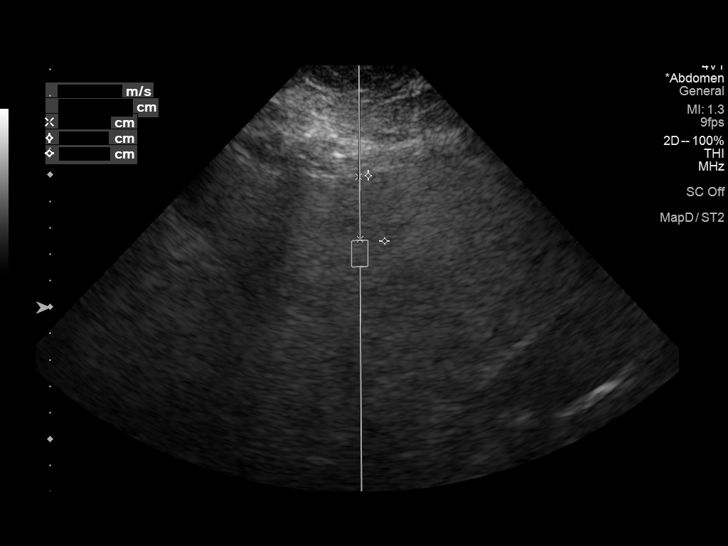
[im 4/13]
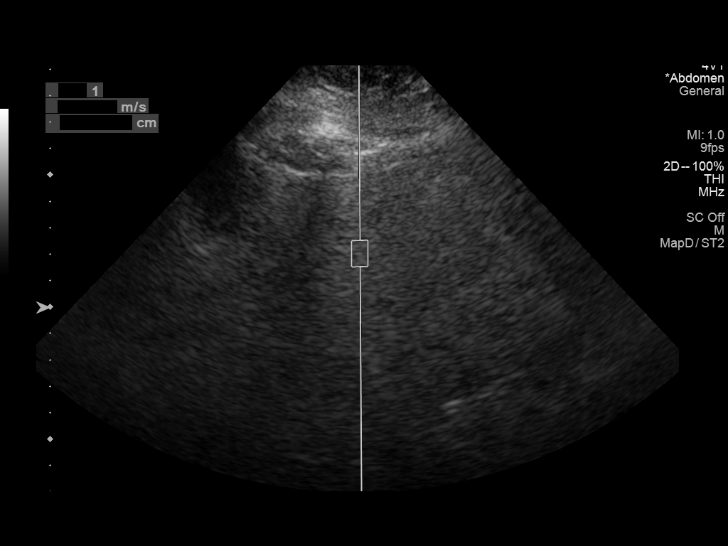
[im 5/13]
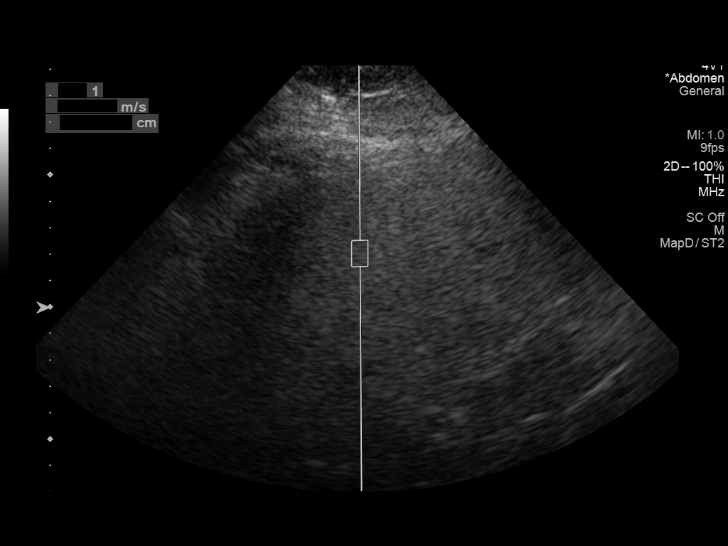
[im 6/13]
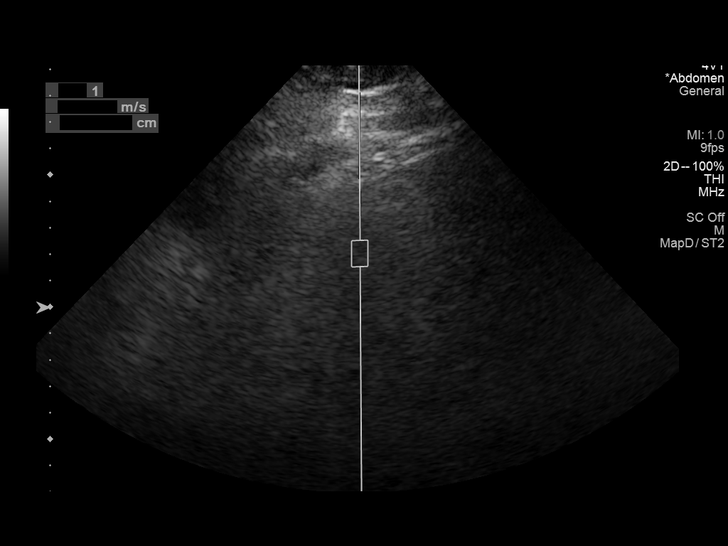
[im 7/13]
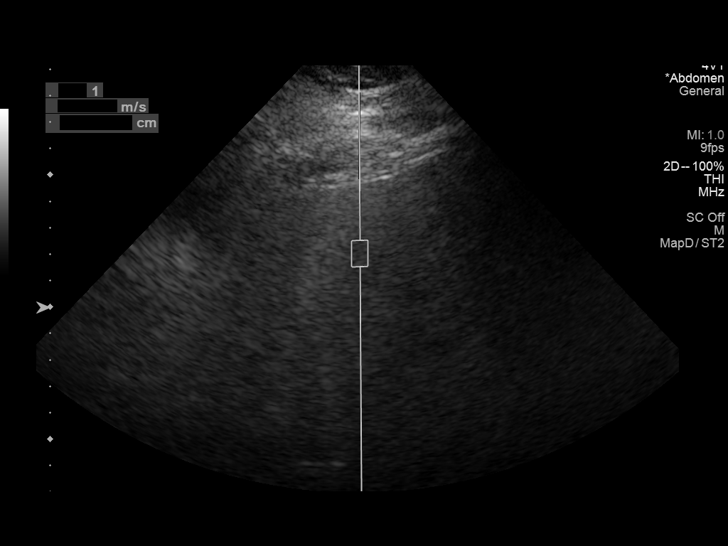
[im 8/13]
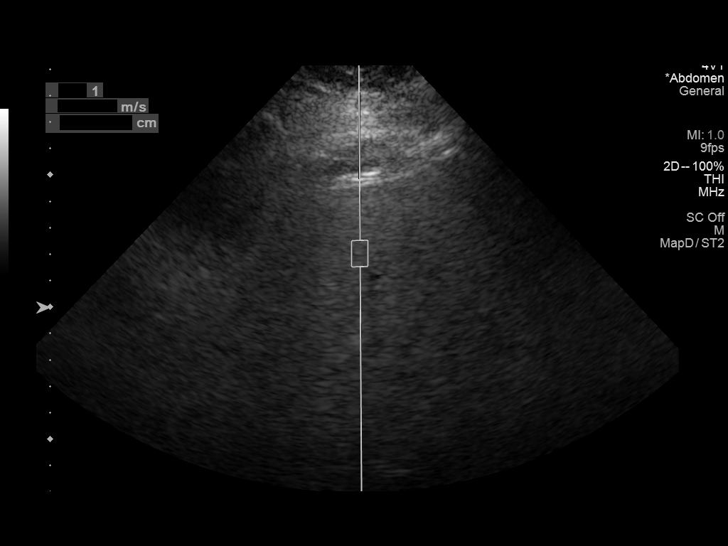
[im 9/13]
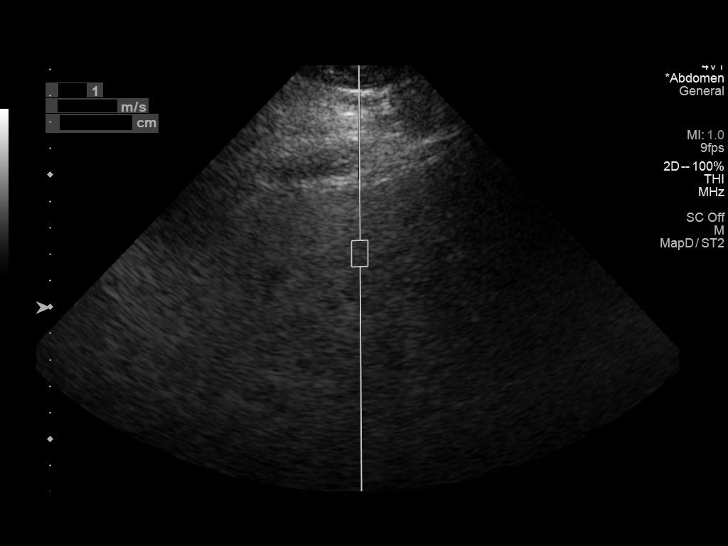
[im 10/13]
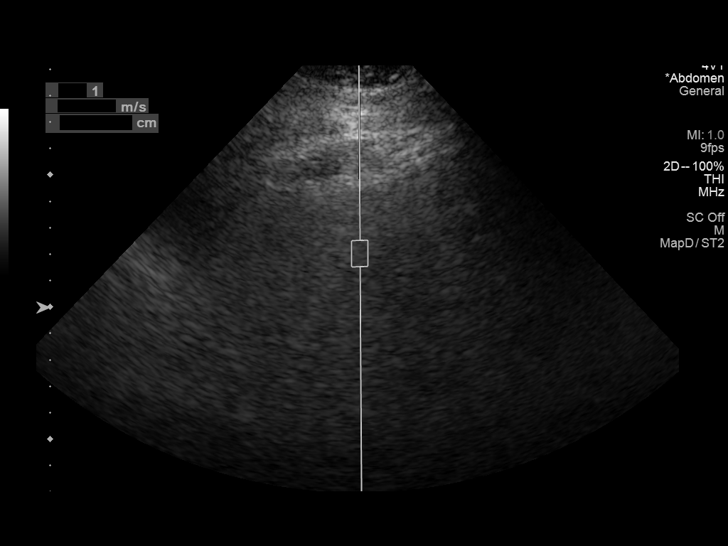
[im 11/13]
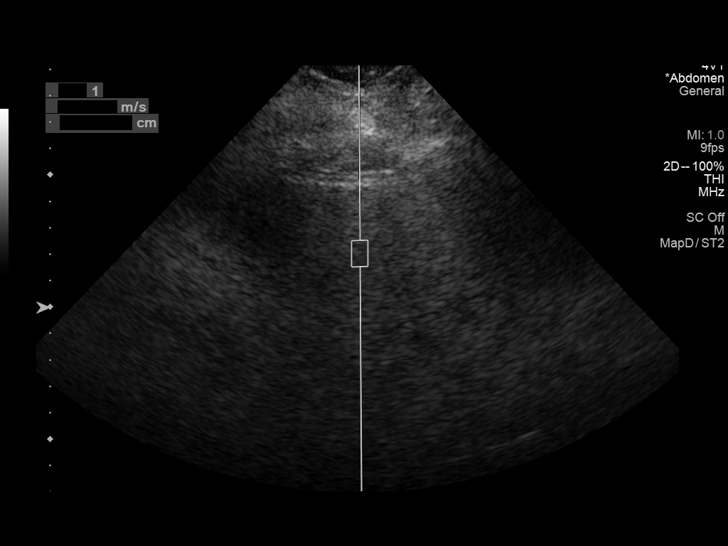
[im 12/13]
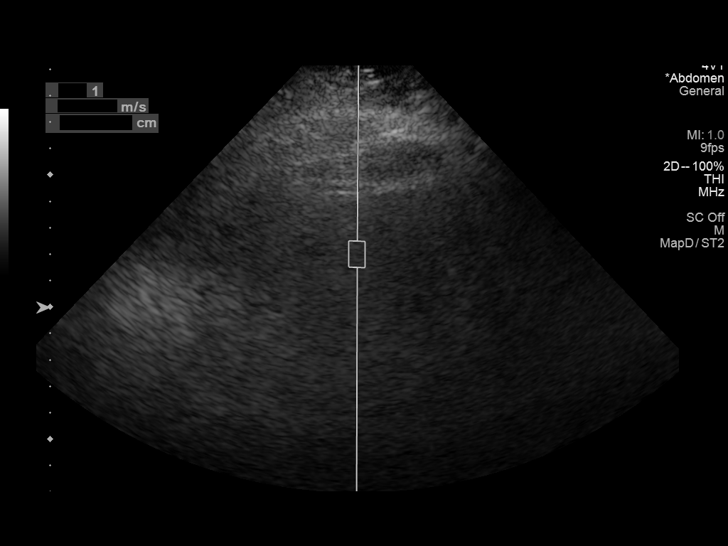
[im 13/13]
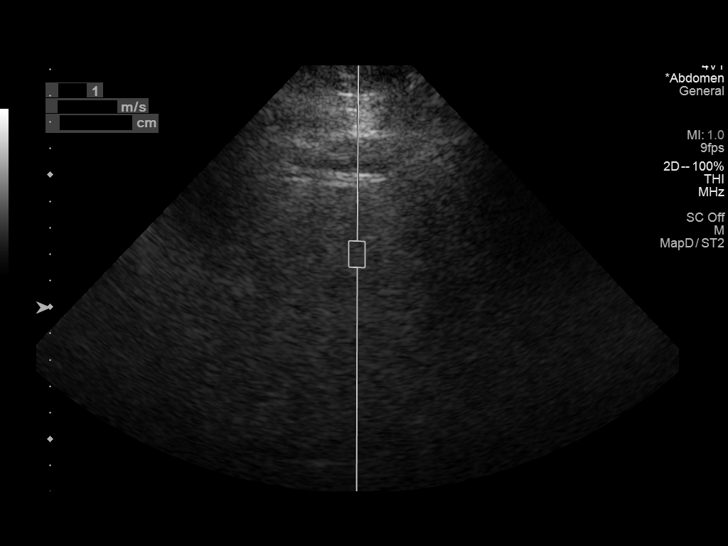

[13 of 13 positions shown; findings below may reference images not displayed]

FINDINGS: Device: Siemens Helix VTQ

Patient position: Supine

Transducer 4V1

Number of measurements: 10

Hepatic segment:  8

Median velocity:   2.69  m/sec

IQR:

IQR/Median velocity ratio:

Corresponding Metavir fibrosis score:  Some F3 + F4

Risk of fibrosis: High

Limitations of exam: Patient body habitus and difficulty with breath
holding.

Pertinent findings noted on other imaging exams:  None

Please note that abnormal shear wave velocities may also be
identified in clinical settings other than with hepatic fibrosis,
such as: acute hepatitis, elevated right heart and central venous
pressures including use of beta blockers, Trisha disease
([place]), infiltrative processes such as
mastocytosis/amyloidosis/infiltrative tumor, extrahepatic
cholestasis, in the post-prandial state, and liver transplantation.
Correlation with patient history, laboratory data, and clinical
condition recommended.
IMPRESSION: Median hepatic shear wave velocity is calculated at 2.69 m/sec.

Corresponding Metavir fibrosis score is  Some F3 + F4.

Risk of fibrosis is High.

Follow-up: Follow up advised

## 2017-06-15 HISTORY — PX: BARIATRIC SURGERY: SHX1103

## 2018-01-15 LAB — HEMOGLOBIN A1C: HEMOGLOBIN A1C: 5.8

## 2018-06-03 ENCOUNTER — Ambulatory Visit: Payer: Self-pay | Admitting: Internal Medicine

## 2018-06-03 NOTE — Progress Notes (Signed)
Cardiology Office Note  Date:  06/04/2018   ID:  Angela Mullen, Angela Mullen 06-17-1956, MRN [id number]  PCP:  Glean Hess, MD   Chief Complaint  Patient presents with  . other    Elevated BP. Meds reviewed verbally with pt.    HPI:  Miss Angela Mullen is a 62 year old woman with past medical history of essential hypertension,  hyperlipidemia type 2 diabetes.  Cardiac catheterization 07/15/2014 revealed normal coronary anatomy and normal left ventricular function Fatty liver Type 2 diabetes COPD Morbid obesity Chronic shortness of breath Chronic lower extremity edema Gastric bypass surgery Who presents to establish care in the cardiology office for her hypertension, and hyperlipidemia  Recent gastric bypass surgery with over 100 pound weight loss Prior history of leg swelling shortness of breath Symptoms have improved with weight loss Was doing well Though started drinking sweet tea recently  She does have rare episodes of dizziness when she stands upconcerning for orthostasis No blood pressure cuff at home  Previous records reviewedEchocardiogram July 20 50,018 showing normal LV function mild LVH normal diastolic function mild aortic valve stenosis peak gradient 28 mmHg mean gradient 13 mmHg  EKG personally reviewed by myself on todays visit Shows normal sinus rhythm rate 62 bpm no significant ST or T-wave changes   PMH:   has a past medical history of Acid reflux, COPD (chronic obstructive pulmonary disease) ([place]), Fatty liver, High blood pressure, High cholesterol, and Sleep apnea.  PSH:    Past Surgical History:  Procedure Laterality Date  . ABDOMINAL HYSTERECTOMY    . BARIATRIC SURGERY    . KNEE SURGERY    . TONSILLECTOMY      Current Outpatient Medications  Medication Sig Dispense Refill  . acetaminophen (TYLENOL) 500 MG tablet Take 500 mg by mouth every 6 (six) hours as needed.    Marland Kitchen ADVAIR HFA 115-21 MCG/ACT inhaler INHALE 2 PUFFS BY MOUTH EVERY 12 HOURS  3   . albuterol (PROVENTIL) (2.5 MG/3ML) 0.083% nebulizer solution Take 2.5 mg by nebulization every 6 (six) hours as needed for wheezing or shortness of breath.    Marland Kitchen amLODipine (NORVASC) 10 MG tablet TAKE 1 TABLET (10 MG TOTAL) BY MOUTH ONCE DAILY. 90 tablet 3  . atenolol (TENORMIN) 50 MG tablet Take 1-2 tablets (50-100 mg total) by mouth as directed. Takes 50 mg tablet am and 100 mg pm daily. 270 tablet 3  . buPROPion (WELLBUTRIN SR) 150 MG 12 hr tablet     . DULoxetine (CYMBALTA) 60 MG capsule Take 60 mg by mouth daily.    . hydrochlorothiazide (HYDRODIURIL) 25 MG tablet TAKE 1 TABLET (25 MG TOTAL) BY MOUTH EVERY MORNING. 90 tablet 3  . IRON-VITAMIN C PO Take by mouth daily.    Marland Kitchen lisinopril (PRINIVIL,ZESTRIL) 20 MG tablet Take 1 tablet (20 mg total) by mouth daily. 90 tablet 3  . lovastatin (MEVACOR) 40 MG tablet Take 40 mg by mouth at bedtime.     Marland Kitchen MELATONIN PO Take by mouth as needed.    . methocarbamol (ROBAXIN) 750 MG tablet Take 750 mg by mouth every 8 (eight) hours as needed.     . Multiple Vitamins tablet Take by mouth.    . pregabalin (LYRICA) 150 MG capsule Take 150 mg by mouth daily.    . sertraline (ZOLOFT) 100 MG tablet Take 100 mg by mouth daily.  0  . SPIRIVA HANDIHALER 18 MCG inhalation capsule INHALE ONE PUFF BY MOUTH ONCE DAILY  3  . traZODone (DESYREL) 100  MG tablet Take 100 mg by mouth at bedtime.     No current facility-administered medications for this visit.      Allergies:   Codeine; Gabapentin; Ketorolac; and Nsaids   Social History:  The patient  reports that she has never smoked. She has never used smokeless tobacco. She reports that she drinks alcohol. She reports that she does not use drugs.   Family History:   family history includes Cerebral aneurysm in her mother; Depression in her sister; Diabetes in her brother and father; Heart attack in her maternal grandfather and sister; High blood pressure in her brother; Kidney disease in her sister; Obesity in her  sister; Stroke in her father.    Review of Systems: Review of Systems  Constitutional: Negative.   Respiratory: Negative.   Cardiovascular: Negative.   Gastrointestinal: Negative.   Musculoskeletal: Negative.   Neurological: Negative.   Psychiatric/Behavioral: Negative.   All other systems reviewed and are negative.   PHYSICAL EXAM: VS:  BP 126/78 (BP Location: Right Arm, Patient Position: Sitting, Cuff Size: Large)   Pulse 62   Ht 5' 6"  (1.676 m)   Wt 281 lb (127.5 kg)   BMI 45.35 kg/m  , BMI Body mass index is 45.35 kg/m. GEN: Well nourished, well developed, in no acute distress  HEENT: normal  Neck: no JVD, carotid bruits, or masses Cardiac: RRR; no murmurs, rubs, or gallops,no edema  Respiratory:  clear to auscultation bilaterally, normal work of breathing GI: soft, nontender, nondistended, + BS MS: no deformity or atrophy  Skin: warm and dry, no rash Neuro:  Strength and sensation are intact Psych: euthymic mood, full affect   Recent Labs: No results found for requested labs within last 8760 hours.    Lipid Panel No results found for: CHOL, HDL, LDLCALC, TRIG    Wt Readings from Last 3 Encounters:  06/04/18 281 lb (127.5 kg)       ASSESSMENT AND PLAN:  Type 2 diabetes mellitus with complication, unspecified whether long term insulin use ([place]) - Plan: EKG 12-Lead Recommend she avoid sweet tea Continue a walking program continued low carbohydrate diet Followed by primary care  Morbid (severe) obesity due to excess calories (HCC) Dramatic weight loss after gastric bypass surgery over 100 pounds  NASH (nonalcoholic steatohepatitis) Recent weight loss should help her that he liver  Mixed hyperlipidemia - Plan: Hepatic function panel, Lipid panel, Basic metabolic panel Lab work drawn today at her request  Essential (primary) hypertension - Plan: EKG 12-Lead, Hepatic function panel, Lipid panel, Basic metabolic panel Reports having dizziness concerning  for orthostasis Does not a blood pressure cuff at home. Recommended she consider buying blood pressure cuff for further medication titration We will decrease lisinopril down to 20 mg daily from 40  Chronic kidney disease (CKD), stage III (moderate) (HCC) Blood work pending, labs drawn today We'll need to check potassium as she is on HCTZ, was previously 3.5 last year Will likely require potassium supplement  H/O gastric bypass Long discussion concerning recent events  complemented her on recent dramatic weight loss  Disposition:   F/U  12 months   Total encounter time more than 45 minutes  Greater than 50% was spent in counseling and coordination of care with the patient   Orders Placed This Encounter  Procedures  . Hepatic function panel  . Lipid panel  . Basic metabolic panel  . EKG 12-Lead     Signed, Esmond Plants, M.D., Ph.D. 06/04/2018  [place],  [place] [phone]

## 2018-06-04 ENCOUNTER — Encounter: Payer: Self-pay | Admitting: Internal Medicine

## 2018-06-04 ENCOUNTER — Ambulatory Visit (INDEPENDENT_AMBULATORY_CARE_PROVIDER_SITE_OTHER): Payer: 59 | Admitting: Internal Medicine

## 2018-06-04 ENCOUNTER — Encounter: Payer: Self-pay | Admitting: Cardiovascular Disease

## 2018-06-04 ENCOUNTER — Ambulatory Visit (INDEPENDENT_AMBULATORY_CARE_PROVIDER_SITE_OTHER): Payer: 59 | Admitting: Cardiovascular Disease

## 2018-06-04 VITALS — BP 134/90 | HR 60 | Resp 16 | Ht 66.0 in | Wt 279.0 lb

## 2018-06-04 VITALS — BP 126/78 | HR 62 | Ht 66.0 in | Wt 281.0 lb

## 2018-06-04 DIAGNOSIS — E118 Type 2 diabetes mellitus with unspecified complications: Secondary | ICD-10-CM

## 2018-06-04 DIAGNOSIS — Z23 Encounter for immunization: Secondary | ICD-10-CM | POA: Diagnosis not present

## 2018-06-04 DIAGNOSIS — E782 Mixed hyperlipidemia: Secondary | ICD-10-CM

## 2018-06-04 DIAGNOSIS — G4733 Obstructive sleep apnea (adult) (pediatric): Secondary | ICD-10-CM | POA: Diagnosis not present

## 2018-06-04 DIAGNOSIS — K7581 Nonalcoholic steatohepatitis (NASH): Secondary | ICD-10-CM

## 2018-06-04 DIAGNOSIS — N183 Chronic kidney disease, stage 3 unspecified: Secondary | ICD-10-CM

## 2018-06-04 DIAGNOSIS — I10 Essential (primary) hypertension: Secondary | ICD-10-CM

## 2018-06-04 DIAGNOSIS — Z9884 Bariatric surgery status: Secondary | ICD-10-CM

## 2018-06-04 MED ORDER — LISINOPRIL 20 MG PO TABS: 20.0000 mg | ORAL_TABLET | Freq: Every day | ORAL | 3 refills | Status: DC

## 2018-06-04 MED ORDER — AMLODIPINE BESYLATE 10 MG PO TABS: ORAL_TABLET | ORAL | 3 refills | Status: DC

## 2018-06-04 MED ORDER — ATENOLOL 50 MG PO TABS: 50.0000 mg | ORAL_TABLET | ORAL | 3 refills | Status: DC

## 2018-06-04 MED ORDER — HYDROCHLOROTHIAZIDE 25 MG PO TABS: ORAL_TABLET | ORAL | 3 refills | Status: DC

## 2018-06-04 NOTE — Patient Instructions (Signed)
Plan for repeat Sleep Study

## 2018-06-04 NOTE — Patient Instructions (Addendum)
Medication Instructions:  Your physician has recommended you make the following change in your medication:  1. STOP Aspirin 2. DECREASE Lisinopril to 20 mg once daily  Refills sent in on amlodipine, atenolol, and hydrochlorothiazide  Labwork:  Labs today: Liver/lipids/BMP  Testing/Procedures:  No further testing at this time   Follow-Up: It was a pleasure seeing you in the office today. Please call us if you have new issues that need to be addressed before your next appt.  [phone]  Your physician wants you to follow-up in:  As needed  If you need a refill on your cardiac medications before your next appointment, please call your pharmacy.  For educational health videos Log in to : [email or web address] Or : [email or web address], password : [email or web address]

## 2018-06-04 NOTE — Progress Notes (Signed)
Name: Angela Mullen MRN: [id number] DOB: 12-21-1955     CONSULTATION DATE: 9.10.19 REFERRING MD : clinic  CHIEF COMPLAINT: fatigue and tired  STUDIES:     8.17.14 CXR independently reviewed by Me today No acute issues NL CXR No pneumonia    HISTORY OF PRESENT ILLNESS: 62 yo WF seen today for evaluation for sleep apnea  She has been dx with Sleep Apnea 15 years ago She states that she on biPAP for last 5 years In last 3 months, she has had issues with too much pressure and she has been taking her mask off She wears full face mask She wakes up more frequently every night and she is tired and fatigue throughout the day Last sleep check was 3 years ago  She has no SOB/DOE No fevers, chills, NVD  She is s/p bariatric surgery and has lost 123 pounds in last year   Patient also has a dx fo COPD for many years She was taking advair/spiriva and alb as needed Now she takes only Spiriva as needed  No signs of COPD exacerbation at this time No signs of infection at this time    PAST MEDICAL HISTORY :   has a past medical history of Acid reflux, COPD (chronic obstructive pulmonary disease) ([place]), Fatty liver, High blood pressure, High cholesterol, and Sleep apnea.  has a past surgical history that includes Abdominal hysterectomy; Knee surgery; Tonsillectomy; and Bariatric Surgery. Prior to Admission medications   Medication Sig Start Date End Date Taking? Authorizing Provider  acetaminophen (TYLENOL) 500 MG tablet Take 500 mg by mouth every 6 (six) hours as needed.   Yes [provider]  ADVAIR HFA 115-21 MCG/ACT inhaler INHALE 2 PUFFS BY MOUTH EVERY 12 HOURS 11/03/15  Yes [provider]  albuterol (PROVENTIL) (2.5 MG/3ML) 0.083% nebulizer solution Take 2.5 mg by nebulization every 6 (six) hours as needed for wheezing or shortness of breath.   Yes [provider]  amLODipine (NORVASC) 10 MG tablet TAKE 1 TABLET (10 MG TOTAL) BY MOUTH ONCE DAILY.  06/04/18  Yes Minna Merritts, MD  atenolol (TENORMIN) 50 MG tablet Take 1-2 tablets (50-100 mg total) by mouth as directed. Takes 50 mg tablet am and 100 mg pm daily. 06/04/18  Yes Minna Merritts, MD  buPROPion [facility] SR) 150 MG 12 hr tablet  10/11/15  Yes [provider]  DULoxetine (CYMBALTA) 60 MG capsule Take 60 mg by mouth daily.   Yes [provider]  hydrochlorothiazide (HYDRODIURIL) 25 MG tablet TAKE 1 TABLET (25 MG TOTAL) BY MOUTH EVERY MORNING. 06/04/18  Yes Gollan, Kathlene November, MD  IRON-VITAMIN C PO Take by mouth daily.   Yes [provider]  lisinopril (PRINIVIL,ZESTRIL) 20 MG tablet Take 1 tablet (20 mg total) by mouth daily. 06/04/18  Yes Gollan, Kathlene November, MD  lovastatin (MEVACOR) 40 MG tablet Take 40 mg by mouth at bedtime.  06/11/15  Yes [provider]  methocarbamol (ROBAXIN) 750 MG tablet Take 750 mg by mouth every 8 (eight) hours as needed.  06/30/15  Yes [provider]  Multiple Vitamins tablet Take by mouth.   Yes [provider]  pregabalin (LYRICA) 150 MG capsule Take 150 mg by mouth daily.   Yes [provider]  SPIRIVA HANDIHALER 18 MCG inhalation capsule INHALE ONE PUFF BY MOUTH ONCE DAILY 10/03/15  Yes [provider]  traZODone (DESYREL) 100 MG tablet Take 100 mg by mouth at bedtime.   Yes [provider]  Allergies  Allergen Reactions  . Codeine Nausea Only and Hives    Headache Light Headed  . Gabapentin Nausea Only and Other (See Comments)    'weird dreams' increased appetite hallucinations  . Ketorolac Other (See Comments)    Altered Mental Status  . Nsaids Diarrhea    FAMILY HISTORY:  family history includes Cerebral aneurysm in her mother; Depression in her sister; Diabetes in her brother and father; Heart attack in her maternal grandfather and sister; High blood pressure in her brother; Kidney disease in her sister; Obesity in her sister; Stroke in her father. SOCIAL  HISTORY:  reports that she has never smoked. She has never used smokeless tobacco. She reports that she drinks alcohol. She reports that she does not use drugs.  REVIEW OF SYSTEMS:   Constitutional: Negative for fever, chills,+ weight loss, +malaise/fatigue HENT: Negative for hearing loss, ear pain, nosebleeds, congestion, sore throat, neck pain, tinnitus and ear discharge.   Eyes: Negative for blurred vision, double vision, photophobia, pain, discharge and redness.  Respiratory: Negative for cough, hemoptysis, sputum production, shortness of breath, wheezing and stridor.   Cardiovascular: Negative for chest pain, palpitations, orthopnea, claudication, leg swelling and PND.  Gastrointestinal: Negative for heartburn, nausea, vomiting, abdominal pain, diarrhea, constipation, blood in stool and melena.  Genitourinary: Negative for dysuria, urgency, frequency, hematuria and flank pain.  Musculoskeletal: Negative for myalgias, back pain, joint pain and falls.  Skin: Negative for itching and rash.  Neurological: Negative for dizziness, tingling, tremors, sensory change, speech change, focal weakness, seizures, loss of consciousness, weakness and headaches.  Endo/Heme/Allergies: Negative for environmental allergies and polydipsia. Does not bruise/bleed easily.  ALL OTHER ROS ARE NEGATIVE   BP 134/90 (BP Location: Right Arm, Cuff Size: Large)   Pulse 60   Resp 16   Ht 5' 6"  (1.676 m)   Wt 279 lb (126.6 kg)   SpO2 96%   BMI 45.03 kg/m    Physical Examination:   GENERAL:NAD, no fevers, chills, no weakness no fatigue HEAD: Normocephalic, atraumatic.  EYES: Pupils equal, round, reactive to light. Extraocular muscles intact. No scleral icterus.  MOUTH: Moist mucosal membrane.   EAR, NOSE, THROAT: Clear without exudates. No external lesions.  NECK: Supple. No thyromegaly. No nodules. No JVD.  PULMONARY:CTA B/L no wheezes, no crackles, no rhonchi CARDIOVASCULAR: S1 and S2. Regular rate and  rhythm. No murmurs, rubs, or gallops. No edema.  GASTROINTESTINAL: Soft, nontender, nondistended. No masses. Positive bowel sounds.  MUSCULOSKELETAL: No swelling, clubbing, or edema. Range of motion full in all extremities.  NEUROLOGIC: Cranial nerves II through XII are intact. No gross focal neurological deficits.  SKIN: No ulceration, lesions, rashes, or cyanosis. Skin warm and dry. Turgor intact.  PSYCHIATRIC: Mood, affect within normal limits. The patient is awake, alert and oriented x 3. Insight, judgment intact.      ASSESSMENT / PLAN: 62 yo morbidly obese pleasant white female seen today for evaluation for sleep study She already has a diagnosis of Sleep apnea however, since her major weight loss, I believe that she still has sleep apnea but her pressures may have changed over the last 6 months. She will need a new evaluation and assessment to determine diagnosis and new treatment plan.   Patient satisfied with Plan of action and management. All questions answered Follow up after 3 months  Juaquin Ludington Patricia Pesa, M.D.  [facility]  Medical Director [place] Director [facility] Cardio-Pulmonary Department

## 2018-06-05 ENCOUNTER — Ambulatory Visit: Payer: Self-pay | Admitting: Internal Medicine

## 2018-06-05 LAB — HEPATIC FUNCTION PANEL
ALBUMIN: 4.6 g/dL (ref 3.6–4.8)
ALK PHOS: 123 IU/L — AB (ref 39–117)
ALT: 23 IU/L (ref 0–32)
AST: 21 IU/L (ref 0–40)
BILIRUBIN TOTAL: 0.3 mg/dL (ref 0.0–1.2)
BILIRUBIN, DIRECT: 0.12 mg/dL (ref 0.00–0.40)
Total Protein: 6.9 g/dL (ref 6.0–8.5)

## 2018-06-05 LAB — BASIC METABOLIC PANEL
BUN/Creatinine Ratio: 9 — ABNORMAL LOW (ref 12–28)
BUN: 13 mg/dL (ref 8–27)
CALCIUM: 10.3 mg/dL (ref 8.7–10.3)
CO2: 22 mmol/L (ref 20–29)
Chloride: 103 mmol/L (ref 96–106)
Creatinine, Ser: 1.37 mg/dL — ABNORMAL HIGH (ref 0.57–1.00)
GFR calc Af Amer: 48 mL/min/{1.73_m2} — ABNORMAL LOW (ref >59)
GFR calc non Af Amer: 42 mL/min/{1.73_m2} — ABNORMAL LOW (ref >59)
GLUCOSE: 115 mg/dL — AB (ref 65–99)
Potassium: 4.8 mmol/L (ref 3.5–5.2)
SODIUM: 140 mmol/L (ref 134–144)

## 2018-06-05 LAB — LIPID PANEL
CHOL/HDL RATIO: 3.9 ratio (ref 0.0–4.4)
Cholesterol, Total: 166 mg/dL (ref 100–199)
HDL: 43 mg/dL (ref >39)
LDL Calculated: 76 mg/dL (ref 0–99)
Triglycerides: 233 mg/dL — ABNORMAL HIGH (ref 0–149)
VLDL Cholesterol Cal: 47 mg/dL — ABNORMAL HIGH (ref 5–40)

## 2018-06-10 ENCOUNTER — Telehealth: Payer: Self-pay | Admitting: Internal Medicine

## 2018-06-10 ENCOUNTER — Telehealth: Payer: Self-pay | Admitting: Cardiovascular Disease

## 2018-06-10 NOTE — Telephone Encounter (Signed)
Patient calling to check on lab results

## 2018-06-10 NOTE — Telephone Encounter (Signed)
What were setting on previous sleep study? We can keep it the same

## 2018-06-10 NOTE — Telephone Encounter (Signed)
LMTCB

## 2018-06-10 NOTE — Telephone Encounter (Signed)
Patient made aware of results and verbalized understanding.   Notes recorded by Minna Merritts, MD on 06/09/2018 at 7:41 PM EDT Lipids In good range Chol 166 LDL 76

## 2018-06-10 NOTE — Telephone Encounter (Signed)
Patient returning call.

## 2018-06-10 NOTE — Telephone Encounter (Signed)
Patient calling to check on status of sleep study process Also would like to know if the office was able to get in touch with [employer] in regards to pressure information needed Please call to discuss

## 2018-06-10 NOTE — Telephone Encounter (Signed)
Contacted [employer] and had patient enrolled in [place]. Her last sleep study was 2012. Compliance placed in MD folder. Are you placing orders for new cpap?

## 2018-06-10 NOTE — Telephone Encounter (Signed)
Pt still wanting to proceed with sleep study as planned. Nothing further needed.

## 2018-06-14 ENCOUNTER — Encounter: Payer: Self-pay | Admitting: Internal Medicine

## 2018-06-14 DIAGNOSIS — F324 Major depressive disorder, single episode, in partial remission: Secondary | ICD-10-CM

## 2018-06-14 DIAGNOSIS — E118 Type 2 diabetes mellitus with unspecified complications: Secondary | ICD-10-CM

## 2018-06-14 DIAGNOSIS — I10 Essential (primary) hypertension: Secondary | ICD-10-CM

## 2018-06-14 DIAGNOSIS — F5081 Binge eating disorder: Secondary | ICD-10-CM | POA: Insufficient documentation

## 2018-06-14 DIAGNOSIS — F50819 Binge eating disorder, unspecified: Secondary | ICD-10-CM | POA: Insufficient documentation

## 2018-06-17 ENCOUNTER — Ambulatory Visit: Payer: Self-pay | Admitting: Internal Medicine

## 2018-07-16 ENCOUNTER — Ambulatory Visit: Payer: Self-pay | Admitting: Family Medicine

## 2018-07-29 ENCOUNTER — Ambulatory Visit: Payer: 59 | Admitting: Family Medicine

## 2018-07-29 ENCOUNTER — Encounter: Payer: Self-pay | Admitting: Family Medicine

## 2018-07-29 VITALS — BP 136/84 | HR 65 | Temp 98.3°F | Ht 64.7 in | Wt 272.1 lb

## 2018-07-29 DIAGNOSIS — E785 Hyperlipidemia, unspecified: Secondary | ICD-10-CM

## 2018-07-29 DIAGNOSIS — F324 Major depressive disorder, single episode, in partial remission: Secondary | ICD-10-CM

## 2018-07-29 DIAGNOSIS — E118 Type 2 diabetes mellitus with unspecified complications: Secondary | ICD-10-CM | POA: Diagnosis not present

## 2018-07-29 DIAGNOSIS — Z1239 Encounter for other screening for malignant neoplasm of breast: Secondary | ICD-10-CM

## 2018-07-29 DIAGNOSIS — I10 Essential (primary) hypertension: Secondary | ICD-10-CM | POA: Diagnosis not present

## 2018-07-29 DIAGNOSIS — J449 Chronic obstructive pulmonary disease, unspecified: Secondary | ICD-10-CM | POA: Diagnosis not present

## 2018-07-29 DIAGNOSIS — G4733 Obstructive sleep apnea (adult) (pediatric): Secondary | ICD-10-CM

## 2018-07-29 DIAGNOSIS — K7581 Nonalcoholic steatohepatitis (NASH): Secondary | ICD-10-CM

## 2018-07-29 DIAGNOSIS — Z7689 Persons encountering health services in other specified circumstances: Secondary | ICD-10-CM

## 2018-07-29 DIAGNOSIS — G629 Polyneuropathy, unspecified: Secondary | ICD-10-CM

## 2018-07-29 DIAGNOSIS — Z113 Encounter for screening for infections with a predominantly sexual mode of transmission: Secondary | ICD-10-CM

## 2018-07-29 DIAGNOSIS — F411 Generalized anxiety disorder: Secondary | ICD-10-CM

## 2018-07-29 DIAGNOSIS — E1169 Type 2 diabetes mellitus with other specified complication: Secondary | ICD-10-CM

## 2018-07-29 MED ORDER — METHOCARBAMOL 750 MG PO TABS: 750.0000 mg | ORAL_TABLET | Freq: Three times a day (TID) | ORAL | 3 refills | Status: DC | PRN

## 2018-07-29 MED ORDER — PREGABALIN 150 MG PO CAPS: 300.0000 mg | ORAL_CAPSULE | Freq: Every day | ORAL | 6 refills | Status: DC

## 2018-07-29 NOTE — Progress Notes (Signed)
BP 136/84 (BP Location: Right Arm, Patient Position: Sitting, Cuff Size: Large)   Pulse 65   Temp 98.3 F (36.8 C)   Ht 5' 4.7" (1.643 m)   Wt 272 lb 2 oz (123.4 kg)   SpO2 93%   BMI 45.70 kg/m    Subjective:    Patient ID: Angela Mullen, female    DOB: 1956/05/24, 62 y.o.   MRN: [id number]  HPI: Angela Mullen is a 61 y.o. female  Chief Complaint  Patient presents with  . Establish Care  . Diabetes    Patient would like her A1C checked   Here today to establish care.   Seeing Dr. Nicolasa Ducking for her psych medications and seeing a therapist regularly. Stable anxiety, insomnia, and depression currently. Denies SI/HI.   HTN and hyperlipidemia managed by Cardiology. BPs stable on atenolol, lisinopril, HCTZ. Chol managed on lovastatin.   Has lost 137 lb since bariatric surgery 05/2017. Has since been able to reduce HTN medications and come off diabetes medications. Was cleared by Endocrinology to be managed by Primary Care for her diabetes. Last A1C 5.8 2 months ago.   Hx of OSA, Scheduled for a sleep study later this week to reassess CPAP pressures now that she's lost significant weight. Managed by Pulmonology for this as well as stable COPD on advair, spiriva, and albuterol prn.   Currently on lyrica and robaxin for RLS and neuropathy, which has been working well for her.   Relevant past medical, surgical, family and social history reviewed and updated as indicated. Interim medical history since our last visit reviewed. Allergies and medications reviewed and updated.  Review of Systems  Per HPI unless specifically indicated above     Objective:    BP 136/84 (BP Location: Right Arm, Patient Position: Sitting, Cuff Size: Large)   Pulse 65   Temp 98.3 F (36.8 C)   Ht 5' 4.7" (1.643 m)   Wt 272 lb 2 oz (123.4 kg)   SpO2 93%   BMI 45.70 kg/m   Wt Readings from Last 3 Encounters:  07/29/18 272 lb 2 oz (123.4 kg)  06/04/18 279 lb (126.6 kg)  06/04/18 281 lb (127.5  kg)    Physical Exam  Constitutional: She is oriented to person, place, and time. She appears well-developed and well-nourished. No distress.  HENT:  Head: Atraumatic.  Eyes: Conjunctivae and EOM are normal.  Neck: Normal range of motion. Neck supple.  Cardiovascular: Normal rate and regular rhythm.  Pulmonary/Chest: Effort normal and breath sounds normal.  Musculoskeletal: Normal range of motion.  Neurological: She is alert and oriented to person, place, and time.  Skin: Skin is warm and dry.  Psychiatric: She has a normal mood and affect. Her behavior is normal.  Nursing note and vitals reviewed.   Results for orders placed or performed in visit on 06/14/18  Hemoglobin A1c  Result Value Ref Range   Hemoglobin A1C 5.8   HM COLONOSCOPY  Result Value Ref Range   HM Colonoscopy Patient Reported See Report (in chart), Patient Reported      Assessment & Plan:   Problem List Items Addressed This Visit      Cardiovascular and Mediastinum   Essential (primary) hypertension    Stable and WNL, managed by Cardiology. Continue current regimen        Respiratory   Chronic obstructive pulmonary disease ([place])    Managed by Pulmonology. Continue current inhaler regimen      OSA (obstructive sleep apnea)  Awaiting repeat sleep study        Digestive   NASH (nonalcoholic steatohepatitis)    Work on lifestyle modifications, continue to monitor LFTs        Endocrine   Hyperlipidemia associated with type 2 diabetes mellitus ([place])    Managed by Cardiology. Stable on lovastatin, continue regimen and lifestyle modifications      DM (diabetes mellitus), type 2 with complications ([place]) - Primary    Resolving with weight loss. Will recheck A1C at upcoming CPE        Nervous and Auditory   Polyneuropathy    Stable on lyrica and robaxin, continue current regimen      Relevant Medications   VYVANSE 30 MG capsule   pregabalin (LYRICA) 150 MG capsule   methocarbamol (ROBAXIN)  750 MG tablet     Other   Depression, major, single episode, in partial remission ([place])    Managed by Psychiatry, continue per their recommendations      Generalized anxiety disorder    Managed by Psychiatry, continue per their recommendations       Other Visit Diagnoses    Encounter to establish care       Screening for breast cancer       Relevant Orders   MM DIGITAL SCREENING BILATERAL   Screening for STD (sexually transmitted disease)       Relevant Orders   Hepatitis C antibody       Follow up plan: Return for CPE.

## 2018-07-31 NOTE — Assessment & Plan Note (Signed)
Managed by Psychiatry, continue per their recommendations

## 2018-07-31 NOTE — Assessment & Plan Note (Signed)
Resolving with weight loss. Will recheck A1C at upcoming CPE

## 2018-07-31 NOTE — Assessment & Plan Note (Signed)
Work on lifestyle modifications, continue to monitor LFTs

## 2018-07-31 NOTE — Assessment & Plan Note (Signed)
Awaiting repeat sleep study

## 2018-07-31 NOTE — Assessment & Plan Note (Signed)
Managed by Cardiology. Stable on lovastatin, continue regimen and lifestyle modifications

## 2018-07-31 NOTE — Assessment & Plan Note (Signed)
Stable on lyrica and robaxin, continue current regimen

## 2018-07-31 NOTE — Assessment & Plan Note (Signed)
Managed by Pulmonology. Continue current inhaler regimen

## 2018-07-31 NOTE — Assessment & Plan Note (Signed)
Stable and WNL, managed by Cardiology. Continue current regimen

## 2018-08-07 ENCOUNTER — Ambulatory Visit: Payer: Self-pay | Admitting: Internal Medicine

## 2018-08-07 ENCOUNTER — Encounter: Payer: Self-pay | Admitting: Family Medicine

## 2018-08-07 ENCOUNTER — Ambulatory Visit (INDEPENDENT_AMBULATORY_CARE_PROVIDER_SITE_OTHER): Payer: 59 | Admitting: Family Medicine

## 2018-08-07 ENCOUNTER — Other Ambulatory Visit (HOSPITAL_COMMUNITY)
Admission: RE | Admit: 2018-08-07 | Discharge: 2018-08-07 | Disposition: A | Discharge status: Home / Self Care | Payer: 59 | Source: Ambulatory Visit | Attending: Family Medicine | Admitting: Family Medicine

## 2018-08-07 VITALS — BP 154/82 | HR 69 | Temp 97.8°F | Ht 65.35 in | Wt 273.0 lb

## 2018-08-07 DIAGNOSIS — E1169 Type 2 diabetes mellitus with other specified complication: Secondary | ICD-10-CM

## 2018-08-07 DIAGNOSIS — E118 Type 2 diabetes mellitus with unspecified complications: Secondary | ICD-10-CM

## 2018-08-07 DIAGNOSIS — Z0000 Encounter for general adult medical examination without abnormal findings: Secondary | ICD-10-CM

## 2018-08-07 DIAGNOSIS — R1011 Right upper quadrant pain: Secondary | ICD-10-CM | POA: Diagnosis not present

## 2018-08-07 DIAGNOSIS — Z114 Encounter for screening for human immunodeficiency virus [HIV]: Secondary | ICD-10-CM | POA: Diagnosis not present

## 2018-08-07 DIAGNOSIS — Z1159 Encounter for screening for other viral diseases: Secondary | ICD-10-CM

## 2018-08-07 DIAGNOSIS — G8929 Other chronic pain: Secondary | ICD-10-CM

## 2018-08-07 DIAGNOSIS — I10 Essential (primary) hypertension: Secondary | ICD-10-CM

## 2018-08-07 DIAGNOSIS — Z124 Encounter for screening for malignant neoplasm of cervix: Secondary | ICD-10-CM | POA: Insufficient documentation

## 2018-08-07 DIAGNOSIS — N898 Other specified noninflammatory disorders of vagina: Secondary | ICD-10-CM

## 2018-08-07 DIAGNOSIS — R109 Unspecified abdominal pain: Secondary | ICD-10-CM

## 2018-08-07 DIAGNOSIS — Z1211 Encounter for screening for malignant neoplasm of colon: Secondary | ICD-10-CM

## 2018-08-07 DIAGNOSIS — E785 Hyperlipidemia, unspecified: Secondary | ICD-10-CM

## 2018-08-07 LAB — UA/M W/RFLX CULTURE, ROUTINE
Bilirubin, UA: NEGATIVE
GLUCOSE, UA: NEGATIVE
Ketones, UA: NEGATIVE
Nitrite, UA: NEGATIVE
PROTEIN UA: NEGATIVE
RBC, UA: NEGATIVE
Specific Gravity, UA: 1.01 (ref 1.005–1.030)
Urobilinogen, Ur: 0.2 mg/dL (ref 0.2–1.0)
pH, UA: 5 (ref 5.0–7.5)

## 2018-08-07 LAB — MICROSCOPIC EXAMINATION: RBC, UA: NONE SEEN /HPF (ref 0–2)

## 2018-08-07 LAB — WET PREP FOR TRICH, YEAST, CLUE
Clue Cell Exam: POSITIVE — AB
TRICHOMONAS EXAM: NEGATIVE
YEAST EXAM: NEGATIVE

## 2018-08-07 NOTE — Patient Instructions (Signed)
Please call [place] @ [phone] to schedule your mammogram.

## 2018-08-07 NOTE — Progress Notes (Signed)
BP (!) 154/82   Pulse 69   Temp 97.8 F (36.6 C) (Oral)   Ht 5' 5.35" (1.66 m)   Wt 273 lb (123.8 kg)   SpO2 97%   BMI 44.94 kg/m    Subjective:    Patient ID: Angela Mullen, female    DOB: [birth year], 62 y.o.   MRN: [id number]  HPI: Angela Mullen is a 62 y.o. female presenting on 08/07/2018 for comprehensive medical examination. Current medical complaints include:Several months of worsening RUQ pain and right back/flank pain. Seems to start after meals, causing pain, sweats, dizziness and then resolves after a few hours. Not trying anything OTC for sxs. No known hx of gallbladder issues, does have GERD and fatty liver and is s/p gastric bypass. No fevers, N/V.   Otherwise, everything is going well. Taking medications faithfully without side effects. Is s/p gastric bypass which helped her lose a significant amount of weight and come off of her diabetes medications and some of her BP medications.   She currently lives with: Menopausal Symptoms: no  Depression Screen done today and results listed below:  Depression screen [facility] 2/9 08/07/2018 07/29/2018  Decreased Interest 1 1  Down, Depressed, Hopeless 1 1  PHQ - 2 Score 2 2  Altered sleeping 1 1  Tired, decreased energy 1 0  Change in appetite 1 0  Feeling bad or failure about yourself  1 0  Trouble concentrating 1 1  Moving slowly or fidgety/restless 1 0  Suicidal thoughts 0 0  PHQ-9 Score 8 4  Difficult doing work/chores - Not difficult at all    The patient does not have a history of falls. I did not complete a risk assessment for falls. A plan of care for falls was not documented.   Past Medical History:  Past Medical History:  Diagnosis Date  . Abnormal liver enzymes 09/03/2014  . Acid reflux   . Allergy   . Anxiety   . Arthritis   . Cataract   . COPD (chronic obstructive pulmonary disease) ([place])   . Depression   . Elevation of level of transaminase or lactic acid dehydrogenase (LDH) 08/27/2012  . Fatty  liver   . High blood pressure   . High cholesterol   . Sleep apnea     Surgical History:  Past Surgical History:  Procedure Laterality Date  . ABDOMINAL HYSTERECTOMY    . BARIATRIC SURGERY  06/15/2017  . CARDIAC CATHETERIZATION    . FRACTURE SURGERY    . KNEE SURGERY    . TONSILLECTOMY    . TUBAL LIGATION      Medications:  Current Outpatient Medications on File Prior to Visit  Medication Sig  . acetaminophen (TYLENOL) 500 MG tablet Take 500 mg by mouth every 6 (six) hours as needed.  Marland Kitchen ADVAIR HFA 115-21 MCG/ACT inhaler INHALE 2 PUFFS BY MOUTH EVERY 12 HOURS  . albuterol (PROVENTIL) (2.5 MG/3ML) 0.083% nebulizer solution Take 2.5 mg by nebulization every 6 (six) hours as needed for wheezing or shortness of breath.  Marland Kitchen amLODipine (NORVASC) 10 MG tablet TAKE 1 TABLET (10 MG TOTAL) BY MOUTH ONCE DAILY.  Marland Kitchen atenolol (TENORMIN) 50 MG tablet Take 1-2 tablets (50-100 mg total) by mouth as directed. Takes 50 mg tablet am and 100 mg pm daily.  Marland Kitchen buPROPion (WELLBUTRIN SR) 150 MG 12 hr tablet   . clotrimazole-betamethasone (LOTRISONE) cream Apply 1 application topically 2 (two) times daily.  . DULoxetine (CYMBALTA) 60 MG capsule Take 60 mg by  mouth daily.  . hydrochlorothiazide (HYDRODIURIL) 25 MG tablet TAKE 1 TABLET (25 MG TOTAL) BY MOUTH EVERY MORNING.  . IRON-VITAMIN C PO Take by mouth daily.  Marland Kitchen lisinopril (PRINIVIL,ZESTRIL) 20 MG tablet Take 1 tablet (20 mg total) by mouth daily.  Marland Kitchen lovastatin (MEVACOR) 40 MG tablet Take 40 mg by mouth at bedtime.   . methocarbamol (ROBAXIN) 750 MG tablet Take 1 tablet (750 mg total) by mouth every 8 (eight) hours as needed.  . Misc Natural Products (CALCIUM PLUS ADVANCED PO) Take by mouth.  . Multiple Vitamins tablet Take by mouth.  . SPIRIVA HANDIHALER 18 MCG inhalation capsule INHALE ONE PUFF BY MOUTH ONCE DAILY  . traZODone (DESYREL) 100 MG tablet Take 100 mg by mouth at bedtime.  Marland Kitchen VYVANSE 30 MG capsule    No current facility-administered  medications on file prior to visit.     Allergies:  Allergies  Allergen Reactions  . Codeine Hives and Nausea Only  . Ketorolac Other (See Comments)    Altered Mental Status  . Prednisone Other (See Comments)    "wheezing"  . Gabapentin Nausea Only and Other (See Comments)    'weird dreams' increased appetite hallucinations  . Nsaids Diarrhea    Social History:  Social History   Socioeconomic History  . Marital status: Married    Spouse name: Not on file  . Number of children: Not on file  . Years of education: Not on file  . Highest education level: Not on file  Occupational History  . Not on file  Social Needs  . Financial resource strain: Not on file  . Food insecurity:    Worry: Not on file    Inability: Not on file  . Transportation needs:    Medical: Not on file    Non-medical: Not on file  Tobacco Use  . Smoking status: Never Smoker  . Smokeless tobacco: Never Used  Substance and Sexual Activity  . Alcohol use: Yes    Comment: occassional  . Drug use: No  . Sexual activity: Not Currently  Lifestyle  . Physical activity:    Days per week: Not on file    Minutes per session: Not on file  . Stress: Not on file  Relationships  . Social connections:    Talks on phone: Not on file    Gets together: Not on file    Attends religious service: Not on file    Active member of club or organization: Not on file    Attends meetings of clubs or organizations: Not on file    Relationship status: Not on file  . Intimate partner violence:    Fear of current or ex partner: Not on file    Emotionally abused: Not on file    Physically abused: Not on file    Forced sexual activity: Not on file  Other Topics Concern  . Not on file  Social History Narrative  . Not on file   Social History   Tobacco Use  Smoking Status Never Smoker  Smokeless Tobacco Never Used   Social History   Substance and Sexual Activity  Alcohol Use Yes   Comment: occassional     Family History:  Family History  Problem Relation Age of Onset  . Cerebral aneurysm Mother   . Stroke Father   . Diabetes Father   . Depression Sister   . Obesity Sister   . Kidney disease Sister   . Heart attack Sister   . Stroke Sister   .  Hypertension Sister   . Diabetes Brother   . High blood pressure Brother   . Heart attack Maternal Grandfather   . Hyperlipidemia Son   . Stroke Maternal Grandmother   . Diabetes Paternal Grandfather   . Hyperlipidemia Paternal Grandfather   . Heart disease Paternal Grandfather    Worsening intermittent RUQ pain after meals with N/V accompanying   Right flank pain  Past medical history, surgical history, medications, allergies, family history and social history reviewed with patient today and changes made to appropriate areas of the chart.    Review of Systems - General ROS: negative Psychological ROS: negative Ophthalmic ROS: negative ENT ROS: negative Allergy and Immunology ROS: negative Hematological and Lymphatic ROS: negative Endocrine ROS: negative Breast ROS: negative for breast lumps Respiratory ROS: no cough, shortness of breath, or wheezing Cardiovascular ROS: no chest pain or dyspnea on exertion Gastrointestinal ROS: positive for - abdominal pain Genito-Urinary ROS: positive for - vulvar/vaginal symptoms Musculoskeletal ROS: negative Neurological ROS: no TIA or stroke symptoms Dermatological ROS: negative All other ROS negative except what is listed above and in the HPI.      Objective:    BP (!) 154/82   Pulse 69   Temp 97.8 F (36.6 C) (Oral)   Ht 5' 5.35" (1.66 m)   Wt 273 lb (123.8 kg)   SpO2 97%   BMI 44.94 kg/m   Wt Readings from Last 3 Encounters:  08/07/18 273 lb (123.8 kg)  07/29/18 272 lb 2 oz (123.4 kg)  06/04/18 279 lb (126.6 kg)    Physical Exam  Constitutional: She is oriented to person, place, and time. She appears well-developed and well-nourished. No distress.  HENT:  Head:  Atraumatic.  Right Ear: External ear normal.  Left Ear: External ear normal.  Nose: Nose normal.  Mouth/Throat: Oropharynx is clear and moist. No oropharyngeal exudate.  Eyes: Pupils are equal, round, and reactive to light. Conjunctivae are normal. No scleral icterus.  Neck: Normal range of motion. Neck supple. No thyromegaly present.  Cardiovascular: Normal rate, regular rhythm, normal heart sounds and intact distal pulses.  Pulmonary/Chest: Effort normal and breath sounds normal. No respiratory distress.  Abdominal: Soft. Bowel sounds are normal. She exhibits no mass. There is tenderness (Diffuse ttp, worse RUQ). There is no rebound and no guarding. No hernia.  - Murphy's sign   Genitourinary: Vaginal discharge found.  Musculoskeletal: Normal range of motion. She exhibits no edema or tenderness.  Lymphadenopathy:    She has no cervical adenopathy.  Neurological: She is alert and oriented to person, place, and time. No cranial nerve deficit.  Skin: Skin is warm and dry. No rash noted.  Psychiatric: She has a normal mood and affect. Her behavior is normal.  Nursing note and vitals reviewed.   Results for orders placed or performed in visit on 08/07/18  WET PREP FOR [place], YEAST, CLUE  Result Value Ref Range   Trichomonas Exam Negative Negative   Yeast Exam Negative Negative   Clue Cell Exam Positive (A) Negative  Microscopic Examination  Result Value Ref Range   WBC, UA 0-5 0 - 5 /hpf   RBC, UA None seen 0 - 2 /hpf   Epithelial Cells (non renal) 0-10 0 - 10 /hpf   Bacteria, UA Few None seen/Few  Hepatitis C Antibody  Result Value Ref Range   Hep C Virus Ab <0.1 0.0 - 0.9 s/co ratio  HIV antibody (with reflex)  Result Value Ref Range   HIV Screen 4th Generation  wRfx Non Reactive Non Reactive  CBC with Differential/Platelet  Result Value Ref Range   WBC 7.9 3.4 - 10.8 x10E3/uL   RBC 4.07 3.77 - 5.28 x10E6/uL   Hemoglobin 12.4 11.1 - 15.9 g/dL   Hematocrit 36.7 34.0 - 46.6 %    MCV 90 79 - 97 fL   MCH 30.5 26.6 - 33.0 pg   MCHC 33.8 31.5 - 35.7 g/dL   RDW 12.7 12.3 - 15.4 %   Platelets 179 150 - 450 x10E3/uL   Neutrophils 73 Not Estab. %   Lymphs 17 Not Estab. %   Monocytes 5 Not Estab. %   Eos 3 Not Estab. %   Basos 1 Not Estab. %   Neutrophils Absolute 5.9 1.4 - 7.0 x10E3/uL   Lymphocytes Absolute 1.4 0.7 - 3.1 x10E3/uL   Monocytes Absolute 0.4 0.1 - 0.9 x10E3/uL   EOS (ABSOLUTE) 0.2 0.0 - 0.4 x10E3/uL   Basophils Absolute 0.0 0.0 - 0.2 x10E3/uL   Immature Granulocytes 1 Not Estab. %   Immature Grans (Abs) 0.0 0.0 - 0.1 x10E3/uL  Comprehensive metabolic panel  Result Value Ref Range   Glucose 96 65 - 99 mg/dL   BUN 14 8 - 27 mg/dL   Creatinine, Ser 1.04 (H) 0.57 - 1.00 mg/dL   GFR calc non Af Amer 58 (L) >59 mL/min/1.73   GFR calc Af Amer 67 >59 mL/min/1.73   BUN/Creatinine Ratio 13 12 - 28   Sodium 142 134 - 144 mmol/L   Potassium 4.1 3.5 - 5.2 mmol/L   Chloride 107 (H) 96 - 106 mmol/L   CO2 21 20 - 29 mmol/L   Calcium 9.4 8.7 - 10.3 mg/dL   Total Protein 6.4 6.0 - 8.5 g/dL   Albumin 4.1 3.6 - 4.8 g/dL   Globulin, Total 2.3 1.5 - 4.5 g/dL   Albumin/Globulin Ratio 1.8 1.2 - 2.2   Bilirubin Total 0.2 0.0 - 1.2 mg/dL   Alkaline Phosphatase 103 39 - 117 IU/L   AST 23 0 - 40 IU/L   ALT 25 0 - 32 IU/L  Lipid Panel w/o Chol/HDL Ratio  Result Value Ref Range   Cholesterol, Total 171 100 - 199 mg/dL   Triglycerides 180 (H) 0 - 149 mg/dL   HDL 48 >39 mg/dL   VLDL Cholesterol Cal 36 5 - 40 mg/dL   LDL Calculated 87 0 - 99 mg/dL  TSH  Result Value Ref Range   TSH 0.854 0.450 - 4.500 uIU/mL  UA/M w/rflx Culture, Routine  Result Value Ref Range   Specific Gravity, UA 1.010 1.005 - 1.030   pH, UA 5.0 5.0 - 7.5   Color, UA Yellow Yellow   Appearance Ur Hazy (A) Clear   Leukocytes, UA 1+ (A) Negative   Protein, UA Negative Negative/Trace   Glucose, UA Negative Negative   Ketones, UA Negative Negative   RBC, UA Negative Negative   Bilirubin,  UA Negative Negative   Urobilinogen, Ur 0.2 0.2 - 1.0 mg/dL   Nitrite, UA Negative Negative   Microscopic Examination See below:   HgB A1c  Result Value Ref Range   Hgb A1c MFr Bld 5.8 (H) 4.8 - 5.6 %   Est. average glucose Bld gHb Est-mCnc 120 mg/dL  Lipase  Result Value Ref Range   Lipase 18 14 - 72 U/L      Assessment & Plan:   Problem List Items Addressed This Visit      Cardiovascular and Mediastinum   Essential (primary) hypertension -  Primary    A bit high today, but staying WNL on home readings. Will continue to monitor closely and work on lifestyle modifications further. Continue current regimen      Relevant Orders   Comprehensive metabolic panel (Completed)   UA/M w/rflx Culture, Routine (Completed)     Endocrine   Hyperlipidemia associated with type 2 diabetes mellitus ([place])    Continue current regimen. Recheck lipids and adjust as needed      Relevant Orders   Lipid Panel w/o Chol/HDL Ratio (Completed)   DM (diabetes mellitus), type 2 with complications (HCC)    Stable, resolved since gastric bypass. Recheck A1C for monitoring, continue lifestyle modifications      Relevant Orders   HgB A1c (Completed)    Other Visit Diagnoses    Annual physical exam       Relevant Orders   CBC with Differential/Platelet (Completed)   TSH (Completed)   RUQ pain       CT abdomen pelvis given chronic flank pain and worsening RUQ pain. Suspect biliary colic vs neprolithiasis. Avoid fatty foods, ER precautions given   Chronic right flank pain       Right upper quadrant pain       Relevant Orders   CT Abdomen Pelvis W Contrast   Lipase (Completed)   Vaginal discharge       Relevant Orders   WET PREP FOR [place], YEAST, CLUE (Completed)   Need for hepatitis C screening test       Relevant Orders   HIV antibody (with reflex) (Completed)   Encounter for screening for HIV       Relevant Orders   Hepatitis C Antibody (Completed)   Screening for cervical cancer        Relevant Orders   Cytology - PAP   Screening for colon cancer       Relevant Orders   Ambulatory referral to Gastroenterology       Follow up plan: No follow-ups on file.   LABORATORY TESTING:  - Pap smear: pap done  IMMUNIZATIONS:   - Tdap: Tetanus vaccination status reviewed: last tetanus booster within 10 years. - Influenza: Up to date - Pneumovax: Up to date - Prevnar: Not applicable - HPV: Not applicable - Zostavax vaccine: Refused  SCREENING: -Mammogram: Ordered today  - Colonoscopy: Ordered today   PATIENT COUNSELING:   Advised to take 1 mg of folate supplement per day if capable of pregnancy.   Sexuality: Discussed sexually transmitted diseases, partner selection, use of condoms, avoidance of unintended pregnancy  and contraceptive alternatives.   Advised to avoid cigarette smoking.  I discussed with the patient that most people either abstain from alcohol or drink within safe limits (<=14/week and <=4 drinks/occasion for males, <=7/weeks and <= 3 drinks/occasion for females) and that the risk for alcohol disorders and other health effects rises proportionally with the number of drinks per week and how often a drinker exceeds daily limits.  Discussed cessation/primary prevention of drug use and availability of treatment for abuse.   Diet: Encouraged to adjust caloric intake to maintain  or achieve ideal body weight, to reduce intake of dietary saturated fat and total fat, to limit sodium intake by avoiding high sodium foods and not adding table salt, and to maintain adequate dietary potassium and calcium preferably from fresh fruits, vegetables, and low-fat dairy products.    stressed the importance of regular exercise  Injury prevention: Discussed safety belts, safety helmets, smoke detector, smoking near bedding or upholstery.  Dental health: Discussed importance of regular tooth brushing, flossing, and dental visits.    NEXT PREVENTATIVE PHYSICAL DUE IN 1  YEAR. No follow-ups on file.

## 2018-08-08 LAB — HEPATITIS C ANTIBODY: Hep C Virus Ab: <0.1 s/co ratio (ref 0.0–0.9)

## 2018-08-08 LAB — COMPREHENSIVE METABOLIC PANEL
ALBUMIN: 4.1 g/dL (ref 3.6–4.8)
ALK PHOS: 103 IU/L (ref 39–117)
ALT: 25 IU/L (ref 0–32)
AST: 23 IU/L (ref 0–40)
Albumin/Globulin Ratio: 1.8 (ref 1.2–2.2)
BUN / CREAT RATIO: 13 (ref 12–28)
BUN: 14 mg/dL (ref 8–27)
Bilirubin Total: 0.2 mg/dL (ref 0.0–1.2)
CALCIUM: 9.4 mg/dL (ref 8.7–10.3)
CO2: 21 mmol/L (ref 20–29)
Chloride: 107 mmol/L — ABNORMAL HIGH (ref 96–106)
Creatinine, Ser: 1.04 mg/dL — ABNORMAL HIGH (ref 0.57–1.00)
GFR calc non Af Amer: 58 mL/min/{1.73_m2} — ABNORMAL LOW (ref >59)
GFR, EST AFRICAN AMERICAN: 67 mL/min/{1.73_m2} (ref >59)
GLUCOSE: 96 mg/dL (ref 65–99)
Globulin, Total: 2.3 g/dL (ref 1.5–4.5)
Potassium: 4.1 mmol/L (ref 3.5–5.2)
Sodium: 142 mmol/L (ref 134–144)
TOTAL PROTEIN: 6.4 g/dL (ref 6.0–8.5)

## 2018-08-08 LAB — LIPID PANEL W/O CHOL/HDL RATIO
CHOLESTEROL TOTAL: 171 mg/dL (ref 100–199)
HDL: 48 mg/dL (ref >39)
LDL CALC: 87 mg/dL (ref 0–99)
Triglycerides: 180 mg/dL — ABNORMAL HIGH (ref 0–149)
VLDL CHOLESTEROL CAL: 36 mg/dL (ref 5–40)

## 2018-08-08 LAB — CBC WITH DIFFERENTIAL/PLATELET
BASOS: 1 %
Basophils Absolute: 0 10*3/uL (ref 0.0–0.2)
EOS (ABSOLUTE): 0.2 10*3/uL (ref 0.0–0.4)
EOS: 3 %
HEMATOCRIT: 36.7 % (ref 34.0–46.6)
HEMOGLOBIN: 12.4 g/dL (ref 11.1–15.9)
IMMATURE GRANS (ABS): 0 10*3/uL (ref 0.0–0.1)
Immature Granulocytes: 1 %
Lymphocytes Absolute: 1.4 10*3/uL (ref 0.7–3.1)
Lymphs: 17 %
MCH: 30.5 pg (ref 26.6–33.0)
MCHC: 33.8 g/dL (ref 31.5–35.7)
MCV: 90 fL (ref 79–97)
MONOCYTES: 5 %
Monocytes Absolute: 0.4 10*3/uL (ref 0.1–0.9)
Neutrophils Absolute: 5.9 10*3/uL (ref 1.4–7.0)
Neutrophils: 73 %
Platelets: 179 10*3/uL (ref 150–450)
RBC: 4.07 x10E6/uL (ref 3.77–5.28)
RDW: 12.7 % (ref 12.3–15.4)
WBC: 7.9 10*3/uL (ref 3.4–10.8)

## 2018-08-08 LAB — HEMOGLOBIN A1C
ESTIMATED AVERAGE GLUCOSE: 120 mg/dL
Hgb A1c MFr Bld: 5.8 % — ABNORMAL HIGH (ref 4.8–5.6)

## 2018-08-08 LAB — LIPASE: LIPASE: 18 U/L (ref 14–72)

## 2018-08-08 LAB — HIV ANTIBODY (ROUTINE TESTING W REFLEX): HIV Screen 4th Generation wRfx: NONREACTIVE

## 2018-08-08 LAB — TSH: TSH: 0.854 u[IU]/mL (ref 0.450–4.500)

## 2018-08-12 ENCOUNTER — Telehealth: Payer: Self-pay

## 2018-08-12 ENCOUNTER — Telehealth: Payer: Self-pay | Admitting: Family Medicine

## 2018-08-12 ENCOUNTER — Other Ambulatory Visit: Payer: Self-pay

## 2018-08-12 DIAGNOSIS — Z1211 Encounter for screening for malignant neoplasm of colon: Secondary | ICD-10-CM

## 2018-08-12 MED ORDER — PREGABALIN 150 MG PO CAPS: 300.0000 mg | ORAL_CAPSULE | Freq: Every day | ORAL | 1 refills | Status: DC

## 2018-08-12 NOTE — Telephone Encounter (Signed)
Rx changed  Copied from [place] [phone]. Topic: General - Other >> Aug 12, 2018  8:40 AM Carolyn Stare wrote:   Pt call to ask if the below med can be called in for 90 days  190 pills cause with insurance company requires 90 day supply    pregabalin (LYRICA) 150 MG capsule

## 2018-08-12 NOTE — Telephone Encounter (Signed)
Called and left patient a VM (signed DPR) letting her know that Apolonio Schneiders had changed her RX and sent it in to the pharmacy for her.

## 2018-08-12 NOTE — Telephone Encounter (Signed)
Patients colonoscopy has been scheduled 09/09/18 with Dr. Vicente Males at [facility].    Thanks [employer]

## 2018-08-12 NOTE — Telephone Encounter (Signed)
Patient returning call to [facility] to schedule procedure. Referral in system. Please call pt at home # to schedule.

## 2018-08-13 DIAGNOSIS — S86919A Strain of unspecified muscle(s) and tendon(s) at lower leg level, unspecified leg, initial encounter: Secondary | ICD-10-CM | POA: Insufficient documentation

## 2018-08-13 NOTE — Assessment & Plan Note (Addendum)
A bit high today, but staying WNL on home readings. Will continue to monitor closely and work on lifestyle modifications further. Continue current regimen

## 2018-08-13 NOTE — Assessment & Plan Note (Signed)
Continue current regimen. Recheck lipids and adjust as needed

## 2018-08-13 NOTE — Assessment & Plan Note (Addendum)
Stable, resolved since gastric bypass. Recheck A1C for monitoring, continue lifestyle modifications

## 2018-08-14 LAB — CYTOLOGY - PAP
BACTERIAL VAGINITIS: NEGATIVE
Candida vaginitis: NEGATIVE
Chlamydia: NEGATIVE
DIAGNOSIS: NEGATIVE
HPV: NOT DETECTED
Neisseria Gonorrhea: NEGATIVE
Trichomonas: NEGATIVE

## 2018-08-15 LAB — CERVICOVAGINAL ANCILLARY ONLY: Herpes: NEGATIVE

## 2018-08-16 ENCOUNTER — Other Ambulatory Visit: Payer: Self-pay

## 2018-08-16 ENCOUNTER — Telehealth: Payer: Self-pay | Admitting: Cardiovascular Disease

## 2018-08-16 ENCOUNTER — Ambulatory Visit: Payer: 59

## 2018-08-16 MED ORDER — LOVASTATIN 40 MG PO TABS: 40.0000 mg | ORAL_TABLET | Freq: Every day | ORAL | 3 refills | Status: DC

## 2018-08-16 NOTE — Telephone Encounter (Signed)
Spoke with patient. Reviewed all patients cardiac medications and ensured they were all for 90 days with 3 refills.  Patient stated she was currently out of Lovastatin.  I told her I would send in a 90 day supply with 3 refills to [facility] in [place].

## 2018-08-16 NOTE — Telephone Encounter (Signed)
Patient calling stating she was here last in Sept and saw Dr Rockey Situ and she was sure that they discussed for her medications we were to send all her them in with a 90 day refill  She is needing to know if we sent them in for 90 or not. For her insurance will charge her more for 30 and if it were 90 day  Please call back

## 2018-08-20 LAB — HM DIABETES EYE EXAM

## 2018-08-27 ENCOUNTER — Telehealth: Payer: Self-pay | Admitting: Gastroenterology

## 2018-08-27 NOTE — Telephone Encounter (Signed)
Pt would like to cancel colon test on Dec. 16. Would like to reschel for Jan 6,7th or 8th

## 2018-08-27 NOTE — Telephone Encounter (Signed)
Pt called back to say she wants to keep the proc on Dec. 16th.

## 2018-08-28 ENCOUNTER — Ambulatory Visit
Admission: RE | Admit: 2018-08-28 | Discharge: 2018-08-28 | Disposition: A | Discharge status: Home / Self Care | Payer: 59 | Source: Ambulatory Visit | Attending: Family Medicine | Admitting: Family Medicine

## 2018-08-28 DIAGNOSIS — R1011 Right upper quadrant pain: Secondary | ICD-10-CM | POA: Diagnosis not present

## 2018-08-28 MED ORDER — IOPAMIDOL (ISOVUE-300) INJECTION 61%
100.0000 mL | Freq: Once | INTRAVENOUS | Status: AC | PRN
  Administered 2018-08-28: 100 mL via INTRAVENOUS

## 2018-09-02 ENCOUNTER — Telehealth: Payer: Self-pay | Admitting: Gastroenterology

## 2018-09-02 NOTE — Telephone Encounter (Signed)
Pt would like to cancel colon test on Thurs. Ila Mcgill the message to cancel for pt

## 2018-09-02 NOTE — Telephone Encounter (Signed)
Received telephone message patient requested to cancel her colonoscopy scheduled with Dr. Vicente Males on 09/09/18 due to family issues.  She will call back and reschedule next year.  No Fee  Thanks Sharyn Lull

## 2018-09-06 ENCOUNTER — Telehealth: Payer: Self-pay

## 2018-09-06 ENCOUNTER — Other Ambulatory Visit: Payer: Self-pay

## 2018-09-06 DIAGNOSIS — Z1211 Encounter for screening for malignant neoplasm of colon: Secondary | ICD-10-CM

## 2018-09-06 NOTE — Telephone Encounter (Signed)
Pt would like to reschedule her procedure for first of the year call her [phone]

## 2018-09-06 NOTE — Telephone Encounter (Signed)
Patient has been contacted to reschedule her colonoscopy.  Her new date is 09/27/2018 with Dr. Vicente Males.  Referral updated.  Order Entered.  Patient states no new instructions are needed.  Thanks [employer]

## 2018-09-09 ENCOUNTER — Ambulatory Visit: Admit: 2018-09-09 | Payer: 59 | Admitting: Gastroenterology

## 2018-09-09 SURGERY — COLONOSCOPY WITH PROPOFOL
Anesthesia: General

## 2018-09-19 ENCOUNTER — Telehealth: Payer: Self-pay | Admitting: Gastroenterology

## 2018-09-19 NOTE — Telephone Encounter (Signed)
Returned patients call to cancel her colonoscopy.  Colonoscopy has been canceled.  She will call back to rescheduled once she is able to look at her schedule.  LVM for Trish in Endoscopy.  Thanks [employer]

## 2018-09-19 NOTE — Telephone Encounter (Signed)
Pt left vm she is scheduled for colonoscopy 09/27/2018 she was not aware of this apt and needs to reschedule because she will be out of town.

## 2018-09-26 ENCOUNTER — Telehealth: Payer: Self-pay | Admitting: Gastroenterology

## 2018-09-26 NOTE — Telephone Encounter (Signed)
Pt is calling to reschedule her procedure to either dates 01/17/, 01/08/ 01/09/ or 01/22 01/22 or 01/23  Call her tomorrow her grandson has a ball game today

## 2018-09-27 ENCOUNTER — Ambulatory Visit: Admit: 2018-09-27 | Payer: 59 | Admitting: Gastroenterology

## 2018-09-27 SURGERY — COLONOSCOPY WITH PROPOFOL
Anesthesia: General

## 2018-09-30 ENCOUNTER — Other Ambulatory Visit: Payer: Self-pay

## 2018-09-30 ENCOUNTER — Telehealth: Payer: Self-pay | Admitting: Gastroenterology

## 2018-09-30 DIAGNOSIS — Z1211 Encounter for screening for malignant neoplasm of colon: Secondary | ICD-10-CM

## 2018-09-30 NOTE — Telephone Encounter (Signed)
Returned patients call to schedule her colonoscopy.  Her Colonoscopy has been scheduled for 10/10/18.  New referral has been created.  Patient states no instructions are need because she still has previous instructions.  Her dietary dates were reviewed and she noted the changes on her instructions.  Thanks [employer]

## 2018-09-30 NOTE — Telephone Encounter (Signed)
Patient called to rescheduled from December for a colonoscopy scheduled with Dr Vicente Males.

## 2018-10-10 ENCOUNTER — Ambulatory Visit: Payer: 59 | Admitting: Certified Registered"

## 2018-10-10 ENCOUNTER — Ambulatory Visit
Admission: RE | Admit: 2018-10-10 | Discharge: 2018-10-10 | Disposition: A | Discharge status: Home / Self Care | Payer: 59 | Attending: Gastroenterology | Admitting: Gastroenterology

## 2018-10-10 ENCOUNTER — Encounter: Payer: Self-pay | Admitting: Gastroenterology

## 2018-10-10 ENCOUNTER — Encounter
Admission: RE | Disposition: A | Discharge status: Home / Self Care | Payer: Self-pay | Source: Home / Self Care | Attending: Gastroenterology

## 2018-10-10 DIAGNOSIS — K219 Gastro-esophageal reflux disease without esophagitis: Secondary | ICD-10-CM | POA: Insufficient documentation

## 2018-10-10 DIAGNOSIS — Z1211 Encounter for screening for malignant neoplasm of colon: Secondary | ICD-10-CM | POA: Insufficient documentation

## 2018-10-10 DIAGNOSIS — I10 Essential (primary) hypertension: Secondary | ICD-10-CM | POA: Insufficient documentation

## 2018-10-10 DIAGNOSIS — G4730 Sleep apnea, unspecified: Secondary | ICD-10-CM | POA: Diagnosis not present

## 2018-10-10 DIAGNOSIS — Z79899 Other long term (current) drug therapy: Secondary | ICD-10-CM | POA: Diagnosis not present

## 2018-10-10 DIAGNOSIS — Z9884 Bariatric surgery status: Secondary | ICD-10-CM | POA: Insufficient documentation

## 2018-10-10 DIAGNOSIS — F329 Major depressive disorder, single episode, unspecified: Secondary | ICD-10-CM | POA: Insufficient documentation

## 2018-10-10 DIAGNOSIS — J449 Chronic obstructive pulmonary disease, unspecified: Secondary | ICD-10-CM | POA: Insufficient documentation

## 2018-10-10 DIAGNOSIS — Z6841 Body Mass Index (BMI) 40.0 and over, adult: Secondary | ICD-10-CM | POA: Insufficient documentation

## 2018-10-10 DIAGNOSIS — E7800 Pure hypercholesterolemia, unspecified: Secondary | ICD-10-CM | POA: Insufficient documentation

## 2018-10-10 DIAGNOSIS — K640 First degree hemorrhoids: Secondary | ICD-10-CM | POA: Insufficient documentation

## 2018-10-10 DIAGNOSIS — F419 Anxiety disorder, unspecified: Secondary | ICD-10-CM | POA: Insufficient documentation

## 2018-10-10 HISTORY — PX: COLONOSCOPY WITH PROPOFOL: SHX5780

## 2018-10-10 LAB — GLUCOSE, CAPILLARY: Glucose-Capillary: 102 mg/dL — ABNORMAL HIGH (ref 70–99)

## 2018-10-10 SURGERY — COLONOSCOPY WITH PROPOFOL
Anesthesia: General

## 2018-10-10 MED ORDER — PROPOFOL 500 MG/50ML IV EMUL
INTRAVENOUS | Status: DC | PRN
  Administered 2018-10-10: 100 ug/kg/min via INTRAVENOUS

## 2018-10-10 MED ORDER — PROPOFOL 10 MG/ML IV BOLUS
INTRAVENOUS | Status: DC | PRN
  Administered 2018-10-10 (×2): 20 mg via INTRAVENOUS
  Administered 2018-10-10: 60 mg via INTRAVENOUS

## 2018-10-10 MED ORDER — PROPOFOL 500 MG/50ML IV EMUL
INTRAVENOUS | Status: AC
  Filled 2018-10-10: qty 50

## 2018-10-10 MED ORDER — EPHEDRINE SULFATE 50 MG/ML IJ SOLN
INTRAMUSCULAR | Status: DC | PRN
  Administered 2018-10-10 (×2): 10 mg via INTRAVENOUS

## 2018-10-10 MED ORDER — GLYCOPYRROLATE 0.2 MG/ML IJ SOLN
INTRAMUSCULAR | Status: DC | PRN
  Administered 2018-10-10: 0.2 mg via INTRAVENOUS

## 2018-10-10 MED ORDER — LIDOCAINE HCL (PF) 2 % IJ SOLN
INTRAMUSCULAR | Status: AC
  Filled 2018-10-10: qty 10

## 2018-10-10 MED ORDER — SODIUM CHLORIDE 0.9 % IV SOLN
INTRAVENOUS | Status: DC
  Administered 2018-10-10: 1000 mL via INTRAVENOUS

## 2018-10-10 MED ORDER — EPHEDRINE SULFATE 50 MG/ML IJ SOLN
INTRAMUSCULAR | Status: AC
  Filled 2018-10-10: qty 1

## 2018-10-10 NOTE — Anesthesia Preprocedure Evaluation (Signed)
Anesthesia Evaluation  Patient identified by MRN, date of birth, ID band Patient awake    Reviewed: Allergy & Precautions, H&P , NPO status , Patient's Chart, lab work & pertinent test results, reviewed documented beta blocker date and time   Airway Mallampati: II   Neck ROM: full    Dental  (+) Poor Dentition, Teeth Intact   Pulmonary sleep apnea and Continuous Positive Airway Pressure Ventilation , COPD,    Pulmonary exam normal        Cardiovascular Exercise Tolerance: Poor hypertension, On Medications negative cardio ROS Normal cardiovascular exam Rhythm:regular Rate:Normal     Neuro/Psych PSYCHIATRIC DISORDERS Anxiety Depression  Neuromuscular disease    GI/Hepatic GERD  Medicated,(+) Hepatitis -  Endo/Other  diabetes, Well Controlled, Type 2Morbid obesity  Renal/GU Renal disease  negative genitourinary   Musculoskeletal   Abdominal   Peds  Hematology negative hematology ROS (+)   Anesthesia Other Findings Past Medical History: 09/03/2014: Abnormal liver enzymes No date: Acid reflux No date: Allergy No date: Anxiety No date: Arthritis No date: Cataract No date: COPD (chronic obstructive pulmonary disease) (HCC) No date: Depression 08/27/2012: Elevation of level of transaminase or lactic acid  dehydrogenase (LDH) No date: Fatty liver No date: High blood pressure No date: High cholesterol No date: Sleep apnea Past Surgical History: No date: ABDOMINAL HYSTERECTOMY 06/15/2017: BARIATRIC SURGERY No date: CARDIAC CATHETERIZATION No date: FRACTURE SURGERY No date: KNEE SURGERY No date: TONSILLECTOMY No date: TUBAL LIGATION BMI    Body Mass Index:  44.80 kg/m     Reproductive/Obstetrics negative OB ROS                             Anesthesia Physical Anesthesia Plan  ASA: III  Anesthesia Plan: General   Post-op Pain Management:    Induction:   PONV Risk Score and  Plan:   Airway Management Planned:   Additional Equipment:   Intra-op Plan:   Post-operative Plan:   Informed Consent: I have reviewed the patients History and Physical, chart, labs and discussed the procedure including the risks, benefits and alternatives for the proposed anesthesia with the patient or authorized representative who has indicated his/her understanding and acceptance.     Dental Advisory Given  Plan Discussed with: CRNA  Anesthesia Plan Comments:         Anesthesia Quick Evaluation

## 2018-10-10 NOTE — H&P (Signed)
Jonathon Bellows, MD [address], [address], [place], [place], [address] [address], [place], [place], [place], [address] Phone: [phone]  Fax: [phone]  Primary Care Physician:  Volney American, PA-C   Pre-Procedure History & Physical: HPI:  Angela Mullen is a 63 y.o. female is here for an colonoscopy.   Past Medical History:  Diagnosis Date  . Abnormal liver enzymes 09/03/2014  . Acid reflux   . Allergy   . Anxiety   . Arthritis   . Cataract   . COPD (chronic obstructive pulmonary disease) ([place])   . Depression   . Elevation of level of transaminase or lactic acid dehydrogenase (LDH) 08/27/2012  . Fatty liver   . High blood pressure   . High cholesterol   . Sleep apnea     Past Surgical History:  Procedure Laterality Date  . ABDOMINAL HYSTERECTOMY    . BARIATRIC SURGERY  06/15/2017  . CARDIAC CATHETERIZATION    . FRACTURE SURGERY    . KNEE SURGERY    . TONSILLECTOMY    . TUBAL LIGATION      Prior to Admission medications   Medication Sig Start Date End Date Taking? Authorizing Provider  acetaminophen (TYLENOL) 500 MG tablet Take 500 mg by mouth every 6 (six) hours as needed.   Yes [provider]  ADVAIR HFA 115-21 MCG/ACT inhaler INHALE 2 PUFFS BY MOUTH EVERY 12 HOURS 11/03/15  Yes [provider]  albuterol (PROVENTIL) (2.5 MG/3ML) 0.083% nebulizer solution Take 2.5 mg by nebulization every 6 (six) hours as needed for wheezing or shortness of breath.   Yes [provider]  amLODipine (NORVASC) 10 MG tablet TAKE 1 TABLET (10 MG TOTAL) BY MOUTH ONCE DAILY. 06/04/18  Yes Minna Merritts, MD  atenolol (TENORMIN) 50 MG tablet Take 1-2 tablets (50-100 mg total) by mouth as directed. Takes 50 mg tablet am and 100 mg pm daily. 06/04/18  Yes Minna Merritts, MD  buPROPion [facility] SR) 150 MG 12 hr tablet  10/11/15  Yes [provider]  clotrimazole-betamethasone (LOTRISONE) cream Apply 1 application topically 2  (two) times daily.   Yes [provider]  DULoxetine (CYMBALTA) 60 MG capsule Take 60 mg by mouth daily.   Yes [provider]  hydrochlorothiazide (HYDRODIURIL) 25 MG tablet TAKE 1 TABLET (25 MG TOTAL) BY MOUTH EVERY MORNING. 06/04/18  Yes Gollan, Kathlene November, MD  IRON-VITAMIN C PO Take by mouth daily.   Yes [provider]  lisinopril (PRINIVIL,ZESTRIL) 20 MG tablet Take 1 tablet (20 mg total) by mouth daily. 06/04/18  Yes Gollan, Kathlene November, MD  lovastatin (MEVACOR) 40 MG tablet Take 1 tablet (40 mg total) by mouth at bedtime. 08/16/18  Yes Gollan, Kathlene November, MD  methocarbamol (ROBAXIN) 750 MG tablet Take 1 tablet (750 mg total) by mouth every 8 (eight) hours as needed. 07/29/18  Yes Volney American, PA-C  Misc Natural Products (CALCIUM PLUS ADVANCED PO) Take by mouth.   Yes [provider]  Multiple Vitamins tablet Take by mouth.   Yes [provider]  pregabalin (LYRICA) 150 MG capsule Take 2 capsules (300 mg total) by mouth daily. 08/12/18  Yes Volney American, PA-C  SPIRIVA HANDIHALER 18 MCG inhalation capsule INHALE ONE PUFF BY MOUTH ONCE DAILY 10/03/15  Yes [provider]  traZODone (DESYREL) 100 MG tablet Take 100 mg by mouth at bedtime.   Yes [provider]  VYVANSE 30 MG capsule  07/22/18  Yes [provider]    Allergies as of 09/30/2018 - Review Complete 08/28/2018  Allergen Reaction Noted  . Codeine Hives and Nausea Only 12/29/2015  . Ketorolac Other (See Comments) 12/29/2015  . Prednisone Other (See Comments) 06/15/2017  . Gabapentin Nausea Only and Other (See Comments) 12/29/2015  . Nsaids Diarrhea 12/29/2015    Family History  Problem Relation Age of Onset  . Cerebral aneurysm Mother   . Stroke Father   . Diabetes Father   . Depression Sister   . Obesity Sister   . Kidney disease Sister   . Heart attack Sister   . Stroke Sister   . Hypertension Sister   . Diabetes Brother   . High  blood pressure Brother   . Heart attack Maternal Grandfather   . Hyperlipidemia Son   . Stroke Maternal Grandmother   . Diabetes Paternal Grandfather   . Hyperlipidemia Paternal Grandfather   . Heart disease Paternal Grandfather     Social History   Socioeconomic History  . Marital status: Married    Spouse name: Not on file  . Number of children: Not on file  . Years of education: Not on file  . Highest education level: Not on file  Occupational History  . Not on file  Social Needs  . Financial resource strain: Not on file  . Food insecurity:    Worry: Not on file    Inability: Not on file  . Transportation needs:    Medical: Not on file    Non-medical: Not on file  Tobacco Use  . Smoking status: Never Smoker  . Smokeless tobacco: Never Used  Substance and Sexual Activity  . Alcohol use: Yes    Comment: occassional  . Drug use: No  . Sexual activity: Not Currently  Lifestyle  . Physical activity:    Days per week: Not on file    Minutes per session: Not on file  . Stress: Not on file  Relationships  . Social connections:    Talks on phone: Not on file    Gets together: Not on file    Attends religious service: Not on file    Active member of club or organization: Not on file    Attends meetings of clubs or organizations: Not on file    Relationship status: Not on file  . Intimate partner violence:    Fear of current or ex partner: Not on file    Emotionally abused: Not on file    Physically abused: Not on file    Forced sexual activity: Not on file  Other Topics Concern  . Not on file  Social History Narrative  . Not on file    Review of Systems: See HPI, otherwise negative ROS  Physical Exam: BP (!) 145/76   Pulse (!) 51   Temp 97.8 F (36.6 C) (Tympanic)   Resp 18   Ht 5' 4"  (1.626 m)   Wt 118.4 kg   SpO2 95%   BMI 44.80 kg/m  General:   Alert,  pleasant and cooperative in NAD Head:  Normocephalic and atraumatic. Neck:  Supple; no masses  or thyromegaly. Lungs:  Clear throughout to auscultation, normal respiratory effort.    Heart:  +S1, +S2, Regular rate and rhythm, No edema. Abdomen:  Soft, nontender and nondistended. Normal bowel sounds, without guarding, and without rebound.   Neurologic:  Alert and  oriented x4;  grossly normal neurologically.  Impression/Plan: Angela Mullen is here for an colonoscopy to  be performed for Screening colonoscopy average risk   Risks, benefits, limitations, and alternatives regarding  colonoscopy have been reviewed with the patient.  Questions have been answered.  All parties agreeable.   Jonathon Bellows, MD  10/10/2018, 9:22 AM

## 2018-10-10 NOTE — Transfer of Care (Signed)
Immediate Anesthesia Transfer of Care Note  Patient: Angela Mullen  Procedure(s) Performed: COLONOSCOPY WITH PROPOFOL (N/A )  Patient Location: Endoscopy Unit  Anesthesia Type:General  Level of Consciousness: awake  Airway & Oxygen Therapy: Patient Spontanous Breathing and Patient connected to nasal cannula oxygen  Post-op Assessment: Report given to RN and Post -op Vital signs reviewed and stable  Post vital signs: stable  Last Vitals:  Vitals Value Taken Time  BP 107/49 10/10/2018 10:02 AM  Temp 36.2 C 10/10/2018 10:02 AM  Pulse 59 10/10/2018 10:04 AM  Resp 17 10/10/2018 10:02 AM  SpO2 97 % 10/10/2018 10:04 AM  Vitals shown include unvalidated device data.  Last Pain:  Vitals:   10/10/18 1002  TempSrc: Tympanic  PainSc: 0-No pain         Complications: No apparent anesthesia complications

## 2018-10-10 NOTE — Op Note (Signed)
[facility] Gastroenterology Patient Name: Angela Mullen Procedure Date: 10/10/2018 9:33 AM MRN: [id number] Account #: [id number] Date of Birth: [birth year] Admit Type: Outpatient Age: 63 Room: [facility] ENDO ROOM 3 Gender: Female Note Status: Finalized Procedure:            Colonoscopy Indications:          Screening for colorectal malignant neoplasm Providers:            Jonathon Bellows MD, MD Referring MD:         Volney American (Referring MD) Medicines:            Monitored Anesthesia Care Complications:        No immediate complications. Procedure:            Pre-Anesthesia Assessment:                       - Prior to the procedure, a History and Physical was                        performed, and patient medications, allergies and                        sensitivities were reviewed. The patient's tolerance of                        previous anesthesia was reviewed.                       - The risks and benefits of the procedure and the                        sedation options and risks were discussed with the                        patient. All questions were answered and informed                        consent was obtained.                       - ASA Grade Assessment: II - A patient with mild                        systemic disease.                       After obtaining informed consent, the colonoscope was                        passed under direct vision. Throughout the procedure,                        the patient's blood pressure, pulse, and oxygen                        saturations were monitored continuously. The                        Colonoscope was introduced through the anus and  advanced to the the cecum, identified by the                        appendiceal orifice, IC valve and transillumination.                        The colonoscopy was performed with ease. The patient                        tolerated the procedure well. The  quality of the bowel                        preparation was good. Findings:      The perianal and digital rectal examinations were normal.      Non-bleeding internal hemorrhoids were found during retroflexion. The       hemorrhoids were medium-sized and Grade I (internal hemorrhoids that do       not prolapse).      The exam was otherwise without abnormality on direct and retroflexion       views. Impression:           - Non-bleeding internal hemorrhoids.                       - The examination was otherwise normal on direct and                        retroflexion views.                       - No specimens collected. Recommendation:       - Discharge patient to home (with escort).                       - Resume previous diet.                       - Continue present medications.                       - Repeat colonoscopy in 10 years for screening purposes. Procedure Code(s):    --- Professional ---                       [phone], Colonoscopy, flexible; diagnostic, including                        collection of specimen(s) by brushing or washing, when                        performed (separate procedure) Diagnosis Code(s):    --- Professional ---                       Z12.11, Encounter for screening for malignant neoplasm                        of colon                       K64.0, First degree hemorrhoids CPT copyright 2018 American Medical Association. All rights reserved. The codes documented in this report are preliminary and upon coder review may  be revised to  meet current compliance requirements. Jonathon Bellows, MD Jonathon Bellows MD, MD 10/10/2018 9:53:13 AM This report has been signed electronically. Number of Addenda: 0 Note Initiated On: 10/10/2018 9:33 AM Scope Withdrawal Time: 0 hours 7 minutes 54 seconds  Total Procedure Duration: 0 hours 11 minutes 20 seconds       [facility]

## 2018-10-10 NOTE — Anesthesia Post-op Follow-up Note (Signed)
Anesthesia QCDR form completed.        

## 2018-10-11 NOTE — Anesthesia Postprocedure Evaluation (Signed)
Anesthesia Post Note  Patient: Astraea Gaughran  Procedure(s) Performed: COLONOSCOPY WITH PROPOFOL (N/A )  Patient location during evaluation: PACU Anesthesia Type: General Level of consciousness: awake and alert Pain management: pain level controlled Vital Signs Assessment: post-procedure vital signs reviewed and stable Respiratory status: spontaneous breathing, nonlabored ventilation, respiratory function stable and patient connected to nasal cannula oxygen Cardiovascular status: blood pressure returned to baseline and stable Postop Assessment: no apparent nausea or vomiting Anesthetic complications: no     Last Vitals:  Vitals:   10/10/18 1002 10/10/18 1012  BP: (!) 107/49 (!) 117/57  Pulse: (!) 56   Resp: 17   Temp: (!) 36.2 C   SpO2: 96%     Last Pain:  Vitals:   10/10/18 1032  TempSrc:   PainSc: 0-No pain                 Molli Barrows

## 2018-10-23 ENCOUNTER — Ambulatory Visit: Payer: 59 | Admitting: Gastroenterology

## 2018-11-26 ENCOUNTER — Ambulatory Visit: Payer: 59 | Admitting: Gastroenterology

## 2018-12-10 ENCOUNTER — Ambulatory Visit: Payer: 59 | Admitting: Gastroenterology

## 2018-12-10 ENCOUNTER — Encounter: Payer: Self-pay | Admitting: Gastroenterology

## 2018-12-10 ENCOUNTER — Other Ambulatory Visit: Payer: Self-pay

## 2018-12-10 VITALS — BP 124/73 | HR 69 | Ht 65.0 in | Wt 269.2 lb

## 2018-12-10 DIAGNOSIS — R1013 Epigastric pain: Secondary | ICD-10-CM | POA: Diagnosis not present

## 2018-12-10 MED ORDER — OMEPRAZOLE 40 MG PO CPDR: 40.0000 mg | DELAYED_RELEASE_CAPSULE | Freq: Every day | ORAL | 3 refills | Status: DC

## 2018-12-10 NOTE — Progress Notes (Signed)
Jonathon Bellows MD, MRCP(U.K) [address]  [place]  [place], [place] [address]  Main: [phone]  Fax: [phone]   Gastroenterology Consultation  Referring Provider:     Volney American,* Primary Care Physician:  Volney American, [place] Primary Gastroenterologist:  Dr. Jonathon Bellows  Reason for Consultation:     Change of bowel movements        HPI:   Angela Mullen is a 63 y.o. y/o female referred for consultation & management  by Dr. Orene Desanctis, Lilia Argue, PA-C.     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 Denies any NSAID use. Losing weight since the surgery. She also has a lot og gas anfd bloating.,   Past Medical History:  Diagnosis Date  . Abnormal liver enzymes 09/03/2014  . Acid reflux   . Allergy   . Anxiety   . Arthritis   . Cataract   . COPD (chronic obstructive pulmonary disease) ([place])   . Depression   . Elevation of level of transaminase or lactic acid dehydrogenase (LDH) 08/27/2012  . Fatty liver   . High blood pressure   . High cholesterol   . Sleep apnea     Past Surgical History:  Procedure Laterality Date  . ABDOMINAL HYSTERECTOMY    . BARIATRIC SURGERY  06/15/2017  . CARDIAC CATHETERIZATION    . COLONOSCOPY WITH PROPOFOL N/A 10/10/2018   Procedure: COLONOSCOPY WITH PROPOFOL;  Surgeon: Jonathon Bellows, MD;  Location: [facility] ENDOSCOPY;  Service: Gastroenterology;  Laterality: N/A;  . FRACTURE SURGERY    . KNEE SURGERY    . TONSILLECTOMY    . TUBAL LIGATION      Prior to Admission medications   Medication Sig Start Date End Date Taking? Authorizing Provider  acetaminophen (TYLENOL) 500 MG tablet Take 500 mg by mouth every 6 (six) hours as needed.   Yes [provider]  ADVAIR HFA 115-21 MCG/ACT inhaler INHALE 2 PUFFS BY  MOUTH EVERY 12 HOURS 11/03/15  Yes [provider]  albuterol (PROVENTIL) (2.5 MG/3ML) 0.083% nebulizer solution Take 2.5 mg by nebulization every 6 (six) hours as needed for wheezing or shortness of breath.   Yes [provider]  amLODipine (NORVASC) 10 MG tablet TAKE 1 TABLET (10 MG TOTAL) BY MOUTH ONCE DAILY. 06/04/18  Yes Minna Merritts, MD  atenolol (TENORMIN) 50 MG tablet Take 1-2 tablets (50-100 mg total) by mouth as directed. Takes 50 mg tablet am and 100 mg pm daily. 06/04/18  Yes Minna Merritts, MD  buPROPion [facility] SR) 150 MG 12 hr tablet  10/11/15  Yes [provider]  clotrimazole-betamethasone (LOTRISONE) cream Apply 1 application topically 2 (two) times daily.   Yes [provider]  DULoxetine (CYMBALTA) 30 MG capsule TAKE 1 DAILY IN THE MORNING (COMBINED WITH 60MG CAPSULE) 10/30/18  Yes [provider]  DULoxetine (CYMBALTA) 60 MG capsule Take 60 mg by mouth daily.   Yes [provider]  hydrochlorothiazide (HYDRODIURIL) 25 MG tablet TAKE 1 TABLET (25 MG TOTAL) BY MOUTH EVERY MORNING. 06/04/18  Yes Gollan, Kathlene November, MD  IRON-VITAMIN C PO Take by mouth daily.   Yes [provider]  lisinopril (PRINIVIL,ZESTRIL) 20 MG tablet Take 1 tablet (20 mg total) by mouth daily. 06/04/18  Yes Minna Merritts, MD  lovastatin (MEVACOR) 40 MG tablet Take 1 tablet (40 mg total) by mouth at bedtime. 08/16/18  Yes Gollan, Kathlene November, MD  methocarbamol (ROBAXIN) 750 MG tablet Take 1 tablet (750 mg total) by mouth every 8 (eight) hours as needed. 07/29/18  Yes Volney American, PA-C  Misc Natural Products (CALCIUM PLUS ADVANCED PO) Take by mouth.   Yes [provider]  Multiple Vitamins tablet Take by mouth.   Yes [provider]  pregabalin (LYRICA) 150 MG capsule Take 2 capsules (300 mg total) by mouth daily. 08/12/18  Yes Volney American, PA-C  SPIRIVA HANDIHALER 18 MCG inhalation capsule INHALE ONE PUFF BY  MOUTH ONCE DAILY 10/03/15  Yes [provider]  traZODone (DESYREL) 100 MG tablet Take 100 mg by mouth at bedtime.   Yes [provider]  VYVANSE 30 MG capsule  07/22/18  Yes [provider]    Family History  Problem Relation Age of Onset  . Cerebral aneurysm Mother   . Stroke Father   . Diabetes Father   . Depression Sister   . Obesity Sister   . Kidney disease Sister   . Heart attack Sister   . Stroke Sister   . Hypertension Sister   . Diabetes Brother   . High blood pressure Brother   . Heart attack Maternal Grandfather   . Hyperlipidemia Son   . Stroke Maternal Grandmother   . Diabetes Paternal Grandfather   . Hyperlipidemia Paternal Grandfather   . Heart disease Paternal Grandfather      Social History   Tobacco Use  . Smoking status: Never Smoker  . Smokeless tobacco: Never Used  Substance Use Topics  . Alcohol use: Yes    Comment: occassional  . Drug use: No    Allergies as of 12/10/2018 - Review Complete 12/10/2018  Allergen Reaction Noted  . Codeine Hives and Nausea Only 12/29/2015  . Ketorolac Other (See Comments) 12/29/2015  . Prednisone Other (See Comments) 06/15/2017  . Gabapentin Nausea Only and Other (See Comments) 12/29/2015  . Nsaids Diarrhea 12/29/2015    Review of Systems:    All systems reviewed and negative except where noted in HPI.   Physical Exam:  BP 124/73   Pulse 69   Ht 5' 5"  (1.651 m)   Wt 269 lb 3.2 oz (122.1 kg)   BMI 44.80 kg/m  No LMP recorded. Patient has had a hysterectomy. Psych:  Alert and cooperative. Normal mood and affect. General:   Alert,  Well-developed, well-nourished, pleasant and cooperative in NAD Head:  Normocephalic and atraumatic. Eyes:  Sclera clear, no icterus.   Conjunctiva pink. Ears:  Normal auditory acuity. Neurologic:  Alert and oriented x3;  grossly normal neurologically. Skin:  Intact without significant lesions or rashes. No jaundice. Psych:  Alert and cooperative.  Normal mood and affect.  Imaging Studies: No results found.  Assessment and Plan:   Angela Mullen is a 63 y.o. y/o female has been referred for GERD,bloating and abdominal pain. Differentials are GERD+/- anastomotic ulcer +/- SIBO  Plan  1. H pylori breath test  2. PPI BID 3. LOW FODMAP diet  4. If fails then EGD  Explained limited exam today and maintained 6 feet distance in view of the COVID virus.   Follow up in 2 months  Dr Jonathon Bellows MD,MRCP(U.K)

## 2018-12-11 LAB — H. PYLORI BREATH TEST: H PYLORI BREATH TEST: NEGATIVE

## 2018-12-13 ENCOUNTER — Encounter: Payer: Self-pay | Admitting: Gastroenterology

## 2018-12-31 ENCOUNTER — Ambulatory Visit: Payer: 59 | Admitting: Gastroenterology

## 2019-03-04 ENCOUNTER — Telehealth: Payer: Self-pay | Admitting: Family Medicine

## 2019-03-04 NOTE — Telephone Encounter (Signed)
Called pt to go over script screening, pt rescheduled appt but is needing a refill on pregabalin. Appt is scheduled for  03/13/19. Please advise.

## 2019-03-05 ENCOUNTER — Ambulatory Visit: Payer: 59 | Admitting: Family Medicine

## 2019-03-05 MED ORDER — PREGABALIN 150 MG PO CAPS: 300.0000 mg | ORAL_CAPSULE | Freq: Every day | ORAL | 1 refills | Status: DC

## 2019-03-05 NOTE — Addendum Note (Signed)
Addended by: Merrie Roof E on: 03/05/2019 02:52 PM   Modules accepted: Orders

## 2019-03-05 NOTE — Telephone Encounter (Signed)
Rx resent.

## 2019-03-12 ENCOUNTER — Ambulatory Visit: Payer: 59 | Admitting: Gastroenterology

## 2019-03-13 ENCOUNTER — Ambulatory Visit: Payer: 59 | Admitting: Family Medicine

## 2019-03-13 ENCOUNTER — Encounter: Payer: Self-pay | Admitting: Family Medicine

## 2019-03-13 ENCOUNTER — Other Ambulatory Visit: Payer: Self-pay

## 2019-03-13 VITALS — BP 137/78 | HR 54 | Temp 98.1°F | Wt 273.0 lb

## 2019-03-13 DIAGNOSIS — F411 Generalized anxiety disorder: Secondary | ICD-10-CM | POA: Diagnosis not present

## 2019-03-13 DIAGNOSIS — I10 Essential (primary) hypertension: Secondary | ICD-10-CM

## 2019-03-13 DIAGNOSIS — F324 Major depressive disorder, single episode, in partial remission: Secondary | ICD-10-CM

## 2019-03-13 DIAGNOSIS — E118 Type 2 diabetes mellitus with unspecified complications: Secondary | ICD-10-CM

## 2019-03-13 DIAGNOSIS — E785 Hyperlipidemia, unspecified: Secondary | ICD-10-CM

## 2019-03-13 DIAGNOSIS — E1169 Type 2 diabetes mellitus with other specified complication: Secondary | ICD-10-CM

## 2019-03-13 MED ORDER — CLOTRIMAZOLE-BETAMETHASONE 1-0.05 % EX CREA: 1.0000 "application " | TOPICAL_CREAM | Freq: Two times a day (BID) | CUTANEOUS | 1 refills | Status: DC

## 2019-03-13 NOTE — Assessment & Plan Note (Signed)
Stable and WNL, continue current regimen 

## 2019-03-13 NOTE — Assessment & Plan Note (Signed)
Recheck lipids, continue current regimen

## 2019-03-13 NOTE — Assessment & Plan Note (Signed)
Recheck A1C, patient wanting to get back on track with lifestyle habits rather than add medication on if not well controlled at this time. Open to starting ozempic in future if needed

## 2019-03-13 NOTE — Assessment & Plan Note (Signed)
Stable, followed by Psychiatry. Continue per their recommendations

## 2019-03-13 NOTE — Progress Notes (Signed)
BP 137/78   Pulse (!) 54   Temp 98.1 F (36.7 C) (Oral)   Wt 273 lb (123.8 kg)   SpO2 94%   BMI 45.43 kg/m    Subjective:    Patient ID: Angela Mullen, female    DOB: [birth year], 63 y.o.   MRN: [id number]  HPI: Angela Mullen is a 63 y.o. female  Chief Complaint  Patient presents with  . Medication Refill    Lyrica, Methocarbamol, Clotrimazole cream  . Diabetes    Pt states DM was soo good she was told she did not need to continue to see specialist at last visit with them([facility]) in November   Here today for 6 month f/u chronic conditions. Cymbalta increased by Psychiatry, feeling a benefit. Taking 90 mg now. Still taking wellbutrin additionally.   BSs have been a bit labile, diet is not where it needs to be. Readings have been 130-180s the past month. Wanting to get back on track with her eating habits and exercise. Denies low blood sugar spells. Currently her DM is diet controlled, does not tolerate metformin. Previously was on victoza which she tolerated well.   Lyrica doing well for her neuropathic sxs. No concerns there.   Breathing doing well, has had a few episodes of wheezing where she's needed her nebulizer but it was typically due to using cleaning products at home. Relieved by albuterol without issue. Taking advair regularly.   HTN - home readings typically 120s-130s/80s, tolerating medications well and taking faithfully. Follwed by Cardiology annually  HLD - on lovastatin, tolerating well. Followed by Cardiology. Has not been watching diet lately or exercising. Denies CP, SOB, HAs, dizziness.   Relevant past medical, surgical, family and social history reviewed and updated as indicated. Interim medical history since our last visit reviewed. Allergies and medications reviewed and updated.  Review of Systems  Per HPI unless specifically indicated above     Objective:    BP 137/78   Pulse (!) 54   Temp 98.1 F (36.7 C) (Oral)   Wt 273 lb  (123.8 kg)   SpO2 94%   BMI 45.43 kg/m   Wt Readings from Last 3 Encounters:  03/13/19 273 lb (123.8 kg)  12/10/18 269 lb 3.2 oz (122.1 kg)  10/10/18 261 lb (118.4 kg)    Physical Exam Vitals signs and nursing note reviewed.  Constitutional:      Appearance: Normal appearance. She is not ill-appearing.  HENT:     Head: Atraumatic.  Eyes:     Extraocular Movements: Extraocular movements intact.     Conjunctiva/sclera: Conjunctivae normal.  Neck:     Musculoskeletal: Normal range of motion and neck supple.  Cardiovascular:     Rate and Rhythm: Normal rate and regular rhythm.     Heart sounds: Normal heart sounds.  Pulmonary:     Effort: Pulmonary effort is normal.     Breath sounds: Normal breath sounds.  Musculoskeletal: Normal range of motion.  Skin:    General: Skin is warm and dry.  Neurological:     Mental Status: She is alert and oriented to person, place, and time.  Psychiatric:        Mood and Affect: Mood normal.        Thought Content: Thought content normal.        Judgment: Judgment normal.     Results for orders placed or performed in visit on 12/10/18  H. pylori breath test  Result Value Ref Range  H pylori Breath Test Negative Negative      Assessment & Plan:   Problem List Items Addressed This Visit      Cardiovascular and Mediastinum   Essential (primary) hypertension    Stable and WNL, continue current regimen      Relevant Orders   Comprehensive metabolic panel     Endocrine   Hyperlipidemia associated with type 2 diabetes mellitus ([place])    Recheck lipids, continue current regimen      Relevant Orders   Lipid Panel w/o Chol/HDL Ratio   DM (diabetes mellitus), type 2 with complications ([facility]) - Primary    Recheck A1C, patient wanting to get back on track with lifestyle habits rather than add medication on if not well controlled at this time. Open to starting ozempic in future if needed      Relevant Orders   HgB A1c     Other    Depression, major, single episode, in partial remission ([place])    Stable and under good control, followed by Psychiatry continue per their recommendations      Generalized anxiety disorder    Stable, followed by Psychiatry. Continue per their recommendations          Follow up plan: Return in about 3 months (around 06/13/2019) for DM.

## 2019-03-13 NOTE — Assessment & Plan Note (Signed)
Stable and under good control, followed by Psychiatry continue per their recommendations

## 2019-03-14 LAB — LIPID PANEL W/O CHOL/HDL RATIO
Cholesterol, Total: 169 mg/dL (ref 100–199)
HDL: 53 mg/dL (ref >39)
LDL Calculated: 88 mg/dL (ref 0–99)
Triglycerides: 138 mg/dL (ref 0–149)
VLDL Cholesterol Cal: 28 mg/dL (ref 5–40)

## 2019-03-14 LAB — COMPREHENSIVE METABOLIC PANEL
ALT: 28 IU/L (ref 0–32)
AST: 25 IU/L (ref 0–40)
Albumin/Globulin Ratio: 2 (ref 1.2–2.2)
Albumin: 4.3 g/dL (ref 3.8–4.8)
Alkaline Phosphatase: 105 IU/L (ref 39–117)
BUN/Creatinine Ratio: 10 — ABNORMAL LOW (ref 12–28)
BUN: 13 mg/dL (ref 8–27)
Bilirubin Total: 0.5 mg/dL (ref 0.0–1.2)
CO2: 24 mmol/L (ref 20–29)
Calcium: 9.2 mg/dL (ref 8.7–10.3)
Chloride: 104 mmol/L (ref 96–106)
Creatinine, Ser: 1.26 mg/dL — ABNORMAL HIGH (ref 0.57–1.00)
GFR calc Af Amer: 53 mL/min/{1.73_m2} — ABNORMAL LOW (ref >59)
GFR calc non Af Amer: 46 mL/min/{1.73_m2} — ABNORMAL LOW (ref >59)
Globulin, Total: 2.1 g/dL (ref 1.5–4.5)
Glucose: 114 mg/dL — ABNORMAL HIGH (ref 65–99)
Potassium: 4.1 mmol/L (ref 3.5–5.2)
Sodium: 140 mmol/L (ref 134–144)
Total Protein: 6.4 g/dL (ref 6.0–8.5)

## 2019-03-14 LAB — HEMOGLOBIN A1C
Est. average glucose Bld gHb Est-mCnc: 120 mg/dL
Hgb A1c MFr Bld: 5.8 % — ABNORMAL HIGH (ref 4.8–5.6)

## 2019-04-08 ENCOUNTER — Ambulatory Visit: Payer: 59 | Admitting: Gastroenterology

## 2019-04-14 ENCOUNTER — Telehealth: Payer: Self-pay | Admitting: Internal Medicine

## 2019-04-14 NOTE — Telephone Encounter (Signed)
Called and spoke to patient. We didn't prescribe ProAir or have documentation of it, it was prescribed by Dr. Raul Del. Patient does have an apt scheduled with Dr. Mortimer Fries but is out of ProAir.   DK please advise if okay to refill prior to seeing patient.

## 2019-04-15 MED ORDER — ALBUTEROL SULFATE HFA 108 (90 BASE) MCG/ACT IN AERS: 1.0000 | INHALATION_SPRAY | Freq: Four times a day (QID) | RESPIRATORY_TRACT | 0 refills | Status: DC | PRN

## 2019-04-15 NOTE — Telephone Encounter (Signed)
Please prescribed proair

## 2019-04-15 NOTE — Telephone Encounter (Signed)
LM with husband for patient to call us regarding rx for proair. Need to confirm pharmacy.

## 2019-04-15 NOTE — Telephone Encounter (Signed)
Called and spoke to patient. She is aware we are sending rx in to [facility] in [place].

## 2019-04-24 ENCOUNTER — Ambulatory Visit: Payer: 59 | Admitting: Physician Assistant

## 2019-05-07 ENCOUNTER — Ambulatory Visit: Payer: 59 | Admitting: Internal Medicine

## 2019-05-12 ENCOUNTER — Ambulatory Visit: Payer: 59 | Admitting: Gastroenterology

## 2019-06-09 ENCOUNTER — Other Ambulatory Visit: Payer: Self-pay | Admitting: *Deleted

## 2019-06-09 ENCOUNTER — Other Ambulatory Visit: Payer: Self-pay | Admitting: Internal Medicine

## 2019-06-09 NOTE — Telephone Encounter (Signed)
Please call to schedule 12 mo F/U. Thank you!

## 2019-06-10 MED ORDER — HYDROCHLOROTHIAZIDE 25 MG PO TABS: ORAL_TABLET | ORAL | 0 refills | Status: DC

## 2019-06-10 MED ORDER — AMLODIPINE BESYLATE 10 MG PO TABS: ORAL_TABLET | ORAL | 0 refills | Status: DC

## 2019-06-10 MED ORDER — LISINOPRIL 20 MG PO TABS: 20.0000 mg | ORAL_TABLET | Freq: Every day | ORAL | 0 refills | Status: DC

## 2019-06-10 MED ORDER — ATENOLOL 50 MG PO TABS: 50.0000 mg | ORAL_TABLET | ORAL | 0 refills | Status: DC

## 2019-06-18 ENCOUNTER — Ambulatory Visit (INDEPENDENT_AMBULATORY_CARE_PROVIDER_SITE_OTHER): Payer: 59 | Admitting: Internal Medicine

## 2019-06-18 ENCOUNTER — Ambulatory Visit: Payer: 59 | Admitting: Family Medicine

## 2019-06-18 ENCOUNTER — Encounter: Payer: Self-pay | Admitting: Internal Medicine

## 2019-06-18 ENCOUNTER — Other Ambulatory Visit: Payer: Self-pay

## 2019-06-18 DIAGNOSIS — J449 Chronic obstructive pulmonary disease, unspecified: Secondary | ICD-10-CM

## 2019-06-18 DIAGNOSIS — G4733 Obstructive sleep apnea (adult) (pediatric): Secondary | ICD-10-CM

## 2019-06-18 NOTE — Patient Instructions (Signed)
Needs CPAP titration study Continue inhalers as prescribed

## 2019-06-18 NOTE — Progress Notes (Signed)
Name: Angela Mullen MRN: [id number] DOB: 1956/04/01     I connected with the patient by telephone enabled telemedicine visit and verified that I am speaking with the correct person using two identifiers.    I discussed the limitations, risks, security and privacy concerns of performing an evaluation and management service by telemedicine and the availability of in-person appointments. I also discussed with the patient that there may be a patient responsible charge related to this service. The patient expressed understanding and agreed to proceed.  PATIENT AGREES AND CONFIRMS -YES   Other persons participating in the visit and their role in the encounter: Patient, nursing  This visit type was conducted due to national recommendations for restrictions regarding the COVID-19 Pandemic (e.g. social distancing).  This format is felt to be most appropriate for this patient at this time.  All issues noted in this document were discussed and addressed.     STUDIES:     8.17.14 CXR independently reviewed by Me today No acute issues NL CXR No pneumonia  CONSULTATION DATE: 06/18/2019  REFERRING MD : clinic  CHIEF COMPLAINT: follow up OSA and COPD  HISTORY OF PRESENT ILLNESS: 63 year old female assess for sleep apnea with a diagnosis of sleep apnea on CPAP centimeters of water pressure And diagnosed with sleep apnea 15 years ago Has been on CPAP for the last 5 years In the last 3 months she states that her excessive daytime sleepiness and fatigue has returned Last sleep study check was 4 years ago  She has a diagnosis of COPD No of COPD exacerbation at this time No evidence of heart failure at this time No evidence or signs of infection at this time No respiratory distress No fevers, chills, nausea, vomiting, diarrhea No evidence of lower extremity edema No evidence hemoptysis Takes spiriva handihaler, advair HFA   Patient  has been having sleep problems for many years Patient  has been having excessive daytime sleepiness for a long time Patient has been having extreme fatigue and tiredness, lack of energy + struggling breathe at night and gasps for air despite being on CPAP   Discussed sleep data and reviewed with patient.  Encouraged proper weight management.  Discussed driving precautions and its relationship with hypersomnolence.  Discussed operating dangerous equipment and its relationship with hypersomnolence.  Discussed sleep hygiene, and benefits of a fixed sleep waked time.  The importance of getting eight or more hours of sleep discussed with patient.  Discussed limiting the use of the computer and television before bedtime.  Decrease naps during the day, so night time sleep will become enhanced.  Limit caffeine, and sleep deprivation.  HTN, stroke, and heart failure are potential risk factors.    PAST MEDICAL HISTORY :   has a past medical history of Abnormal liver enzymes (09/03/2014), Acid reflux, Allergy, Anxiety, Arthritis, Cataract, COPD (chronic obstructive pulmonary disease) ([place]), Depression, Elevation of level of transaminase or lactic acid dehydrogenase (LDH) (08/27/2012), Fatty liver, High blood pressure, High cholesterol, and Sleep apnea.  has a past surgical history that includes Abdominal hysterectomy; Knee surgery; Tonsillectomy; Bariatric Surgery (06/15/2017); Fracture surgery; Tubal ligation; Cardiac catheterization; and Colonoscopy with propofol (N/A, 10/10/2018). Prior to Admission medications   Medication Sig Start Date End Date Taking? Authorizing Provider  acetaminophen (TYLENOL) 500 MG tablet Take 500 mg by mouth every 6 (six) hours as needed.   Yes [provider]  ADVAIR HFA 115-21 MCG/ACT inhaler INHALE 2 PUFFS BY MOUTH EVERY 12 HOURS 11/03/15  Yes [provider]  albuterol (PROVENTIL) (2.5 MG/3ML) 0.083% nebulizer solution Take 2.5 mg by nebulization every 6 (six) hours as needed for wheezing or shortness of  breath.   Yes [provider]  amLODipine (NORVASC) 10 MG tablet TAKE 1 TABLET (10 MG TOTAL) BY MOUTH ONCE DAILY. 06/04/18  Yes Minna Merritts, MD  atenolol (TENORMIN) 50 MG tablet Take 1-2 tablets (50-100 mg total) by mouth as directed. Takes 50 mg tablet am and 100 mg pm daily. 06/04/18  Yes Minna Merritts, MD  buPROPion [facility] SR) 150 MG 12 hr tablet  10/11/15  Yes [provider]  DULoxetine (CYMBALTA) 60 MG capsule Take 60 mg by mouth daily.   Yes [provider]  hydrochlorothiazide (HYDRODIURIL) 25 MG tablet TAKE 1 TABLET (25 MG TOTAL) BY MOUTH EVERY MORNING. 06/04/18  Yes Gollan, Kathlene November, MD  IRON-VITAMIN C PO Take by mouth daily.   Yes [provider]  lisinopril (PRINIVIL,ZESTRIL) 20 MG tablet Take 1 tablet (20 mg total) by mouth daily. 06/04/18  Yes Gollan, Kathlene November, MD  lovastatin (MEVACOR) 40 MG tablet Take 40 mg by mouth at bedtime.  06/11/15  Yes [provider]  methocarbamol (ROBAXIN) 750 MG tablet Take 750 mg by mouth every 8 (eight) hours as needed.  06/30/15  Yes [provider]  Multiple Vitamins tablet Take by mouth.   Yes [provider]  pregabalin (LYRICA) 150 MG capsule Take 150 mg by mouth daily.   Yes [provider]  SPIRIVA HANDIHALER 18 MCG inhalation capsule INHALE ONE PUFF BY MOUTH ONCE DAILY 10/03/15  Yes [provider]  traZODone (DESYREL) 100 MG tablet Take 100 mg by mouth at bedtime.   Yes [provider]   Allergies  Allergen Reactions  . Codeine Hives and Nausea Only  . Ketorolac Other (See Comments)    Altered Mental Status  . Prednisone Other (See Comments)    "wheezing"  . Gabapentin Nausea Only and Other (See Comments)    'weird dreams' increased appetite hallucinations  . Nsaids Diarrhea    FAMILY HISTORY:  family history includes Cerebral aneurysm in her mother; Depression in her sister; Diabetes in her brother, father, and paternal grandfather;  Heart attack in her maternal grandfather and sister; Heart disease in her paternal grandfather; High blood pressure in her brother; Hyperlipidemia in her paternal grandfather and son; Hypertension in her sister; Kidney disease in her sister; Obesity in her sister; Stroke in her father, maternal grandmother, and sister. SOCIAL HISTORY:  reports that she has never smoked. She has never used smokeless tobacco. She reports current alcohol use. She reports that she does not use drugs.   Review of Systems:  Gen:  Denies  fever, sweats, chills weight loss  HEENT: Denies blurred vision, double vision, ear pain, eye pain, hearing loss, nose bleeds, sore throat Cardiac:  No dizziness, chest pain or heaviness, chest tightness,edema, No JVD Resp:   No cough, -sputum production, -shortness of breath,-wheezing, -hemoptysis,  Gi: Denies swallowing difficulty, stomach pain, nausea or vomiting, diarrhea, constipation, bowel incontinence Gu:  Denies bladder incontinence, burning urine Ext:   Denies Joint pain, stiffness or swelling Skin: Denies  skin rash, easy bruising or bleeding or hives Endoc:  Denies polyuria, polydipsia , polyphagia or weight change Psych:   Denies depression, insomnia or hallucinations  Other:  All other systems negative        ASSESSMENT / PLAN:  Obstructive sleep apnea Continue CPAP as prescribed however patient needs CPAP titration study  to assess changes in therapy She will need a CPAP titration study and possibly new treatment plan  COPD Seems to be under control at this time Continue Spiriva and Advair as prescribed Pro-air as needed    COVID-19 EDUCATION: The signs and symptoms of COVID-19 were discussed with the patient and how to seek care for testing.  The importance of social distancing was discussed today. Hand Washing Techniques and avoid touching face was advised.  MEDICATION ADJUSTMENTS/LABS AND TESTS ORDERED: Needs CPAP titration study Continue  inhalers as prescribed   CURRENT MEDICATIONS REVIEWED AT LENGTH WITH PATIENT TODAY   Patient satisfied with Plan of action and management. All questions answered  Follow up in 3 months  TOTAL TIME SPENT 24 mins  Maretta Bees Patricia Pesa, M.D.  [facility]  Medical Director [place] Director [facility] Cardio-Pulmonary Department

## 2019-06-19 ENCOUNTER — Ambulatory Visit: Payer: 59 | Admitting: Family Medicine

## 2019-06-24 NOTE — Progress Notes (Signed)
Virtual Visit via Video Note   This visit type was conducted due to national recommendations for restrictions regarding the COVID-19 Pandemic (e.g. social distancing) in an effort to limit this patient's exposure and mitigate transmission in our community.  Due to her co-morbid illnesses, this patient is at least at moderate risk for complications without adequate follow up.  This format is felt to be most appropriate for this patient at this time.  All issues noted in this document were discussed and addressed.  A limited physical exam was performed with this format.  Please refer to the patient's chart for her consent to telehealth for [facility].   I connected with  Angela Mullen on 06/25/19 by a video enabled telemedicine application and verified that I am speaking with the correct person using two identifiers. I discussed the limitations of evaluation and management by telemedicine. The patient expressed understanding and agreed to proceed.   Evaluation Performed:  Follow-up visit  Date:  06/25/2019   ID:  Angela Mullen, Angela Mullen 18-Jun-1956, MRN [id number]  Patient Location:  [address] [place] [address]   Provider location:   [facility], [place] office  PCP:  Volney American, PA-C  Cardiologist:  Patsy Baltimore   Chief Complaint:      History of Present Illness:    Angela Mullen is a 63 y.o. female who presents via audio/video conferencing for a telehealth visit today.   The patient does not symptoms concerning for COVID-19 infection (fever, chills, cough, or new SHORTNESS OF BREATH).   Patient has a past medical history of essential hypertension,  hyperlipidemia type 2 diabetes.  Cardiac catheterization 07/15/2014 revealed normal coronary anatomy and normal left ventricular function Fatty liver Type 2 diabetes COPD Morbid obesity Chronic shortness of breath Chronic lower extremity edema Gastric bypass surgery Who  presents for follow-up of her hypertension and hyperlipidemia  BP stable 120-130/70 [place]   Weight up 10-15 pounds Previous gastric bypass surgery with over 100 pound weight loss  Drinking sweet tea, carbs  No blood pressure cuff at home  Has a therapist/psychiatry  Labs reviewed HBA1C 5.8 Total chol 169, LDL 88 CR 1.26    Prior CV studies:   The following studies were reviewed today:   Echocardiogram July 20 50,018 showing normal LV function mild LVH normal diastolic function mild aortic valve stenosis peak gradient 28 mmHg mean gradient 13 mmHg   Past Medical History:  Diagnosis Date  . Abnormal liver enzymes 09/03/2014  . Acid reflux   . Allergy   . Anxiety   . Arthritis   . Cataract   . COPD (chronic obstructive pulmonary disease) ([place])   . Depression   . Elevation of level of transaminase or lactic acid dehydrogenase (LDH) 08/27/2012  . Fatty liver   . High blood pressure   . High cholesterol   . Sleep apnea    Past Surgical History:  Procedure Laterality Date  . ABDOMINAL HYSTERECTOMY    . BARIATRIC SURGERY  06/15/2017  . CARDIAC CATHETERIZATION    . COLONOSCOPY WITH PROPOFOL N/A 10/10/2018   Procedure: COLONOSCOPY WITH PROPOFOL;  Surgeon: Jonathon Bellows, MD;  Location: [facility] ENDOSCOPY;  Service: Gastroenterology;  Laterality: N/A;  . FRACTURE SURGERY    . KNEE SURGERY    . TONSILLECTOMY    . TUBAL LIGATION        Allergies:   Codeine, Ketorolac, Prednisone, Gabapentin, and Nsaids   Social History  Tobacco Use  . Smoking status: Never Smoker  . Smokeless tobacco: Never Used  Substance Use Topics  . Alcohol use: Yes    Comment: occassional  . Drug use: No     Current Outpatient Medications on File Prior to Visit  Medication Sig Dispense Refill  . acetaminophen (TYLENOL) 500 MG tablet Take 500 mg by mouth every 6 (six) hours as needed.    Marland Kitchen ADVAIR HFA 115-21 MCG/ACT inhaler INHALE 2 PUFFS BY MOUTH EVERY 12 HOURS  3  . albuterol (PROVENTIL)  (2.5 MG/3ML) 0.083% nebulizer solution Take 2.5 mg by nebulization every 6 (six) hours as needed for wheezing or shortness of breath.    Marland Kitchen albuterol (VENTOLIN HFA) 108 (90 Base) MCG/ACT inhaler INHALE 1-2 PUFFS INTO THE LUNGS EVERY 6 (SIX) HOURS AS NEEDED FOR WHEEZING OR SHORTNESS OF BREATH. 18 g 0  . amLODipine (NORVASC) 10 MG tablet TAKE 1 TABLET (10 MG TOTAL) BY MOUTH ONCE DAILY. 90 tablet 0  . atenolol (TENORMIN) 50 MG tablet Take 1-2 tablets (50-100 mg total) by mouth as directed. Takes 50 mg tablet am and 100 mg pm daily. 270 tablet 0  . buPROPion (WELLBUTRIN SR) 150 MG 12 hr tablet Take 150 mg by mouth 2 (two) times daily.     . clotrimazole-betamethasone (LOTRISONE) cream Apply 1 application topically 2 (two) times daily. 30 g 1  . DULoxetine (CYMBALTA) 30 MG capsule TAKE 1 DAILY IN THE MORNING (COMBINED WITH 60MG CAPSULE)    . DULoxetine (CYMBALTA) 60 MG capsule Take 60 mg by mouth daily.    . hydrochlorothiazide (HYDRODIURIL) 25 MG tablet TAKE 1 TABLET (25 MG TOTAL) BY MOUTH EVERY MORNING. 90 tablet 0  . IRON-VITAMIN C PO Take by mouth daily.    Marland Kitchen lisinopril (ZESTRIL) 20 MG tablet Take 1 tablet (20 mg total) by mouth daily. 90 tablet 0  . lovastatin (MEVACOR) 40 MG tablet Take 1 tablet (40 mg total) by mouth at bedtime. 90 tablet 3  . methocarbamol (ROBAXIN) 750 MG tablet Take 1 tablet (750 mg total) by mouth every 8 (eight) hours as needed. 90 tablet 3  . Misc Natural Products (CALCIUM PLUS ADVANCED PO) Take by mouth.    . Multiple Vitamins tablet Take by mouth.    Marland Kitchen omeprazole (PRILOSEC) 40 MG capsule Take 1 capsule (40 mg total) by mouth daily. 90 capsule 3  . pregabalin (LYRICA) 150 MG capsule Take 2 capsules (300 mg total) by mouth daily. 180 capsule 1  . SPIRIVA HANDIHALER 18 MCG inhalation capsule INHALE ONE PUFF BY MOUTH ONCE DAILY  3  . traZODone (DESYREL) 100 MG tablet Take 100 mg by mouth at bedtime.    Marland Kitchen VYVANSE 30 MG capsule      No current facility-administered  medications on file prior to visit.      Family Hx: The patient's family history includes Cerebral aneurysm in her mother; Depression in her sister; Diabetes in her brother, father, and paternal grandfather; Heart attack in her maternal grandfather and sister; Heart disease in her paternal grandfather; High blood pressure in her brother; Hyperlipidemia in her paternal grandfather and son; Hypertension in her sister; Kidney disease in her sister; Obesity in her sister; Stroke in her father, maternal grandmother, and sister.  ROS:   Please see the history of present illness.    Review of Systems  Constitutional: Negative.        Weight increase  HENT: Negative.   Respiratory: Negative.   Cardiovascular: Negative.   Gastrointestinal: Negative.  Musculoskeletal: Negative.   Neurological: Negative.   Psychiatric/Behavioral: Negative.   All other systems reviewed and are negative.     Labs/Other Tests and Data Reviewed:    Recent Labs: 08/07/2018: Hemoglobin 12.4; Platelets 179; TSH 0.854 03/13/2019: ALT 28; BUN 13; Creatinine, Ser 1.26; Potassium 4.1; Sodium 140   Recent Lipid Panel Lab Results  Component Value Date/Time   CHOL 169 03/13/2019 09:29 AM   TRIG 138 03/13/2019 09:29 AM   HDL 53 03/13/2019 09:29 AM   CHOLHDL 3.9 06/04/2018 09:48 AM   LDLCALC 88 03/13/2019 09:29 AM    Wt Readings from Last 3 Encounters:  06/25/19 278 lb (126.1 kg)  03/13/19 273 lb (123.8 kg)  12/10/18 269 lb 3.2 oz (122.1 kg)     Exam:    Vital Signs: Vital signs may also be detailed in the HPI BP 137/78   Pulse (!) 54   Ht 5' 4"  (1.626 m)   Wt 278 lb (126.1 kg)   BMI 47.72 kg/m   Wt Readings from Last 3 Encounters:  06/25/19 278 lb (126.1 kg)  03/13/19 273 lb (123.8 kg)  12/10/18 269 lb 3.2 oz (122.1 kg)   Temp Readings from Last 3 Encounters:  03/13/19 98.1 F (36.7 C) (Oral)  10/10/18 (!) 97.2 F (36.2 C) (Tympanic)  08/07/18 97.8 F (36.6 C) (Oral)   BP Readings from Last 3  Encounters:  06/25/19 137/78  03/13/19 137/78  12/10/18 124/73   Pulse Readings from Last 3 Encounters:  06/25/19 (!) 54  03/13/19 (!) 54  12/10/18 69     Well nourished, well developed female in no acute distress. Constitutional:  oriented to person, place, and time. No distress.    ASSESSMENT & PLAN:    Problem List Items Addressed This Visit      Cardiology Problems   Essential (primary) hypertension     Other   NASH (nonalcoholic steatohepatitis)   H/O gastric bypass   Chronic kidney disease (CKD), stage III (moderate) (HCC)    Other Visit Diagnoses    Morbid (severe) obesity due to excess calories ([place])    -  Primary   Mixed hyperlipidemia         Type 2 diabetes mellitus with complication, unspecified whether long term insulin use ([place]) - Plan: EKG 12-Lead  avoid sweet tea, snacks Follow a low carbohydrate diet HBA1C 5.8  Morbid (severe) obesity due to excess calories (HCC) Dramatic weight loss after gastric bypass surgery over 100 pounds Weight up >10 pounds past 6 months Need knee replacement  NASH (nonalcoholic steatohepatitis) Normal LFTs Recommend continued weight loss  Mixed hyperlipidemia - Relatively well-controlled numbers LDL 88 Continue statin  Essential (primary) hypertension  Blood pressure is well controlled on today's visit. No changes made to the medications.  Chronic kidney disease (CKD), stage III (moderate) (HCC) Stable,  For worsening numbers could wean down on the HCTZ   COVID-19 Education: The signs and symptoms of COVID-19 were discussed with the patient and how to seek care for testing (follow up with PCP or arrange E-visit).  The importance of social distancing was discussed today.  Patient Risk:   After full review of this patients clinical status, I feel that they are at least moderate risk at this time.  Time:   Today, I have spent 25 minutes with the patient with telehealth technology discussing the cardiac and  medical problems/diagnoses detailed above   Additional 10 min spent reviewing the chart prior to patient visit today   Medication  Adjustments/Labs and Tests Ordered: Current medicines are reviewed at length with the patient today.  Concerns regarding medicines are outlined above.   Tests Ordered: No tests ordered   Medication Changes: No changes made   Disposition: Follow-up in 12 months   Signed, Ida Rogue, MD  [place] [facility] [address] [place] [address], [place], [place] [address]

## 2019-06-25 ENCOUNTER — Telehealth (INDEPENDENT_AMBULATORY_CARE_PROVIDER_SITE_OTHER): Payer: 59 | Admitting: Cardiovascular Disease

## 2019-06-25 DIAGNOSIS — N183 Chronic kidney disease, stage 3 unspecified: Secondary | ICD-10-CM

## 2019-06-25 DIAGNOSIS — I10 Essential (primary) hypertension: Secondary | ICD-10-CM | POA: Diagnosis not present

## 2019-06-25 DIAGNOSIS — E782 Mixed hyperlipidemia: Secondary | ICD-10-CM

## 2019-06-25 DIAGNOSIS — Z9884 Bariatric surgery status: Secondary | ICD-10-CM

## 2019-06-25 DIAGNOSIS — K7581 Nonalcoholic steatohepatitis (NASH): Secondary | ICD-10-CM

## 2019-06-25 MED ORDER — LISINOPRIL 20 MG PO TABS: 20.0000 mg | ORAL_TABLET | Freq: Every day | ORAL | 3 refills | Status: DC

## 2019-06-25 MED ORDER — OMEPRAZOLE 40 MG PO CPDR: 40.0000 mg | DELAYED_RELEASE_CAPSULE | Freq: Every day | ORAL | 3 refills | Status: DC

## 2019-06-25 MED ORDER — HYDROCHLOROTHIAZIDE 25 MG PO TABS: ORAL_TABLET | ORAL | 3 refills | Status: DC

## 2019-06-25 MED ORDER — ATENOLOL 50 MG PO TABS: ORAL_TABLET | ORAL | 3 refills | Status: DC

## 2019-06-25 MED ORDER — AMLODIPINE BESYLATE 10 MG PO TABS: ORAL_TABLET | ORAL | 3 refills | Status: DC

## 2019-06-25 NOTE — Patient Instructions (Addendum)
Medication Instructions:  No changes  Your cardiac medications have been refilled today.  If you need a refill on your cardiac medications before your next appointment, please call your pharmacy.    Lab work: No new labs needed   If you have labs (blood work) drawn today and your tests are completely normal, you will receive your results only by: Marland Kitchen MyChart Message (if you have MyChart) OR . A paper copy in the mail If you have any lab test that is abnormal or we need to change your treatment, we will call you to review the results.   Testing/Procedures: No new testing needed   Follow-Up: At [facility], you and your health needs are our priority.  As part of our continuing mission to provide you with exceptional heart care, we have created designated Provider Care Teams.  These Care Teams include your primary Cardiologist (physician) and Advanced Practice Providers (APPs -  Physician Assistants and Nurse Practitioners) who all work together to provide you with the care you need, when you need it.  . You will need a follow up appointment in 12 months (late September/ early October) .   Please call our office 2 months in advance to schedule this appointment. (Call in early July to schedule)   . Providers on your designated Care Team:   . Murray Hodgkins, NP . Christell Faith, PA-C . Marrianne Mood, PA-C  Any Other Special Instructions Will Be Listed Below (If Applicable).  For educational health videos Log in to : [email or web address] Or : [email or web address], password : [email or web address]

## 2019-06-26 ENCOUNTER — Ambulatory Visit (INDEPENDENT_AMBULATORY_CARE_PROVIDER_SITE_OTHER): Payer: 59 | Admitting: Family Medicine

## 2019-06-26 ENCOUNTER — Other Ambulatory Visit: Payer: Self-pay

## 2019-06-26 ENCOUNTER — Encounter: Payer: Self-pay | Admitting: Family Medicine

## 2019-06-26 VITALS — BP 154/82 | HR 69 | Ht 64.0 in | Wt 281.0 lb

## 2019-06-26 DIAGNOSIS — E118 Type 2 diabetes mellitus with unspecified complications: Secondary | ICD-10-CM

## 2019-06-26 MED ORDER — OZEMPIC (0.25 OR 0.5 MG/DOSE) 2 MG/1.5ML ~~LOC~~ SOPN: 0.2500 mg | PEN_INJECTOR | SUBCUTANEOUS | 0 refills | Status: DC

## 2019-06-26 MED ORDER — CLOTRIMAZOLE-BETAMETHASONE 1-0.05 % EX CREA: 1.0000 "application " | TOPICAL_CREAM | Freq: Two times a day (BID) | CUTANEOUS | 1 refills | Status: DC

## 2019-06-26 NOTE — Progress Notes (Signed)
BP (!) 154/82   Pulse 69   Ht 5' 4"  (1.626 m)   Wt 281 lb (127.5 kg)   BMI 48.23 kg/m    Subjective:    Patient ID: Angela Mullen, female    DOB: 1956-02-13, 63 y.o.   MRN: [id number]  HPI: Angela Mullen is a 63 y.o. female  Chief Complaint  Patient presents with  . Diabetes    . This visit was completed via webex"} due to the restrictions of the COVID-19 pandemic. All issues as above were discussed and addressed. Physical exam was done as above through visual confirmation on WebEx. If it was felt that the patient should be evaluated in the office, they were directed there. The patient verbally consented to this visit. . Location of the patient: home . Location of the provider: work . Those involved with this call:  . Provider: Merrie Roof, PA-C . CMA: Merilyn Baba, [place] . Front Desk/Registration: Jill Side  . Time spent on call: 25 minutes with patient face to face via video conference. More than 50% of this time was spent in counseling and coordination of care. 5 minutes total spent in review of patient's record and preparation of their chart. I verified patient identity using two factors (patient name and date of birth). Patient consents verbally to being seen via telemedicine visit today.   Patient presenting today for 3 month DM and weight f/u. Home BSs still running around 100-140s depending on diet. Currently diet controlled, previously on victoza which she did well with. Still very concerned about her weight. Had bariatric surgery in 2018 but was unable to keep following with specialist afterward due to some insurance issues so has fallen back into old habits. Is part of a forum for bariatric surgery patients and has been reading up on phentermine, very interested in starting something like this. Does not currently exercise and notes she's been eating large portions and not following any particular diet strategy.   Relevant past medical, surgical, family and  social history reviewed and updated as indicated. Interim medical history since our last visit reviewed. Allergies and medications reviewed and updated.  Review of Systems  Per HPI unless specifically indicated above     Objective:    BP (!) 154/82   Pulse 69   Ht 5' 4"  (1.626 m)   Wt 281 lb (127.5 kg)   BMI 48.23 kg/m   Wt Readings from Last 3 Encounters:  06/26/19 281 lb (127.5 kg)  06/25/19 278 lb (126.1 kg)  03/13/19 273 lb (123.8 kg)    Physical Exam Vitals signs and nursing note reviewed.  Constitutional:      General: She is not in acute distress.    Appearance: Normal appearance. She is obese.  HENT:     Head: Atraumatic.     Right Ear: External ear normal.     Left Ear: External ear normal.     Nose: Nose normal. No congestion.     Mouth/Throat:     Mouth: Mucous membranes are moist.     Pharynx: Oropharynx is clear. No posterior oropharyngeal erythema.  Eyes:     Extraocular Movements: Extraocular movements intact.     Conjunctiva/sclera: Conjunctivae normal.  Neck:     Musculoskeletal: Normal range of motion.  Cardiovascular:     Comments: Unable to assess via virtual visit Pulmonary:     Effort: Pulmonary effort is normal. No respiratory distress.  Musculoskeletal: Normal range of motion.  Skin:  General: Skin is dry.     Findings: No erythema.  Neurological:     Mental Status: She is alert and oriented to person, place, and time.  Psychiatric:        Mood and Affect: Mood normal.        Thought Content: Thought content normal.        Judgment: Judgment normal.    Results for orders placed or performed in visit on 03/13/19  Comprehensive metabolic panel  Result Value Ref Range   Glucose 114 (H) 65 - 99 mg/dL   BUN 13 8 - 27 mg/dL   Creatinine, Ser 1.26 (H) 0.57 - 1.00 mg/dL   GFR calc non Af Amer 46 (L) >59 mL/min/1.73   GFR calc Af Amer 53 (L) >59 mL/min/1.73   BUN/Creatinine Ratio 10 (L) 12 - 28   Sodium 140 134 - 144 mmol/L   Potassium  4.1 3.5 - 5.2 mmol/L   Chloride 104 96 - 106 mmol/L   CO2 24 20 - 29 mmol/L   Calcium 9.2 8.7 - 10.3 mg/dL   Total Protein 6.4 6.0 - 8.5 g/dL   Albumin 4.3 3.8 - 4.8 g/dL   Globulin, Total 2.1 1.5 - 4.5 g/dL   Albumin/Globulin Ratio 2.0 1.2 - 2.2   Bilirubin Total 0.5 0.0 - 1.2 mg/dL   Alkaline Phosphatase 105 39 - 117 IU/L   AST 25 0 - 40 IU/L   ALT 28 0 - 32 IU/L  Lipid Panel w/o Chol/HDL Ratio  Result Value Ref Range   Cholesterol, Total 169 100 - 199 mg/dL   Triglycerides 138 0 - 149 mg/dL   HDL 53 >39 mg/dL   VLDL Cholesterol Cal 28 5 - 40 mg/dL   LDL Calculated 88 0 - 99 mg/dL  HgB A1c  Result Value Ref Range   Hgb A1c MFr Bld 5.8 (H) 4.8 - 5.6 %   Est. average glucose Bld gHb Est-mCnc 120 mg/dL      Assessment & Plan:   Problem List Items Addressed This Visit      Endocrine   DM (diabetes mellitus), type 2 with complications ([place]) - Primary    Previous A1C while diet controlled under good control, but patient continues to struggle losing weight and with diet choices. Will start ozempic and monitor for benefit. 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 Monitor closely for benefit      Relevant Medications   Semaglutide,0.25 or 0.5MG/DOS, (OZEMPIC, 0.25 OR 0.5 MG/DOSE,) 2 MG/1.5ML SOPN      25 minutes spent today in direct care and counseling via virtual visit  Follow up plan: Return in about 4 weeks (around 07/24/2019) for weight f/u, blood sugar check.

## 2019-06-27 ENCOUNTER — Other Ambulatory Visit: Payer: Self-pay | Admitting: Internal Medicine

## 2019-06-27 NOTE — Assessment & Plan Note (Signed)
Will try ozempic and lifestyle modifciations. Monitor closely for benefit

## 2019-06-27 NOTE — Assessment & Plan Note (Signed)
Previous A1C while diet controlled under good control, but patient continues to struggle losing weight and with diet choices. Will start ozempic and monitor for benefit. Work hard on lifestyle changes additionally

## 2019-07-02 ENCOUNTER — Telehealth: Payer: Self-pay | Admitting: Internal Medicine

## 2019-07-02 NOTE — Telephone Encounter (Signed)
Michele with [facility] to contact patient to arrange CPAP Titration Study. [name] J Cobb

## 2019-07-02 NOTE — Telephone Encounter (Signed)
Michele at [facility] contacted patient and CPAP Titration has been scheduled for 07/23/2019. Pt is aware of appointment as Selinda Eon scheduled appointment with patient. Nothing else needed at this time. [name] J Cobb

## 2019-07-02 NOTE — Telephone Encounter (Signed)
Pt is inquiring about her CPAP Titration Study at [facility]. Order has been sent to [facility].  LMOVM for Sharyn Lull to return call as to status of appointment.  Pt aware that I will return call after I speak with Sharyn Lull.  [name] J Cobb

## 2019-07-02 NOTE — Telephone Encounter (Signed)
[name], please advise.  

## 2019-07-17 ENCOUNTER — Telehealth: Payer: Self-pay | Admitting: Internal Medicine

## 2019-07-17 NOTE — Telephone Encounter (Signed)
Spoke to pt to relay date/time of covid test for sleep study.  Pt stated that she is currently out of town and will not be able to attend sleep study. Pt stated that she spoke with Sharyn Lull at [facility] and canceled study. Pt will call back when she is ready to reschedule. Nothing further is needed.

## 2019-07-24 ENCOUNTER — Other Ambulatory Visit: Payer: Self-pay | Admitting: Family Medicine

## 2019-07-28 ENCOUNTER — Encounter: Payer: Self-pay | Admitting: Family Medicine

## 2019-07-28 ENCOUNTER — Ambulatory Visit (INDEPENDENT_AMBULATORY_CARE_PROVIDER_SITE_OTHER): Payer: 59 | Admitting: Family Medicine

## 2019-07-28 ENCOUNTER — Other Ambulatory Visit: Payer: Self-pay

## 2019-07-28 ENCOUNTER — Other Ambulatory Visit: Payer: Self-pay | Admitting: Family Medicine

## 2019-07-28 VITALS — BP 116/68 | HR 62 | Temp 98.3°F

## 2019-07-28 DIAGNOSIS — L989 Disorder of the skin and subcutaneous tissue, unspecified: Secondary | ICD-10-CM | POA: Diagnosis not present

## 2019-07-28 DIAGNOSIS — E118 Type 2 diabetes mellitus with unspecified complications: Secondary | ICD-10-CM

## 2019-07-28 DIAGNOSIS — Z23 Encounter for immunization: Secondary | ICD-10-CM | POA: Diagnosis not present

## 2019-07-28 NOTE — Patient Instructions (Signed)

## 2019-07-28 NOTE — Progress Notes (Signed)
BP 116/68   Pulse 62   Temp 98.3 F (36.8 C) (Oral)   SpO2 94%    Subjective:    Patient ID: Angela Mullen, female    DOB: 07-20-1956, 63 y.o.   MRN: [id number]  HPI: Angela Mullen is a 63 y.o. female  Chief Complaint  Patient presents with  . Diabetes    4 week f/up  . Sore    spot on her nose, states it came up this weekend   Here today for 1 month f/u since starting ozempic for weight loss and DM. Has noticed a decrease in appetite overall which has been great for her. Tolerating well without side effects or low blood sugar spells. Trying to eat much healthier and be more active, frustrated the scales aren't changing much.    Sore spot on nose that was first noticed 4 days ago. Thought it was a pimple and was able to pop it, very sore and dark in the area. Bleeding some. Does feel the cpap irritates it some. Does use a tanning bed often. Using peroxide and neosporin without relief.   Relevant past medical, surgical, family and social history reviewed and updated as indicated. Interim medical history since our last visit reviewed. Allergies and medications reviewed and updated.  Review of Systems  Per HPI unless specifically indicated above     Objective:    BP 116/68   Pulse 62   Temp 98.3 F (36.8 C) (Oral)   SpO2 94%   Wt Readings from Last 3 Encounters:  06/26/19 281 lb (127.5 kg)  06/25/19 278 lb (126.1 kg)  03/13/19 273 lb (123.8 kg)    Physical Exam Vitals signs and nursing note reviewed.  Constitutional:      Appearance: Normal appearance. She is obese. She is not ill-appearing.  HENT:     Head: Atraumatic.  Eyes:     Extraocular Movements: Extraocular movements intact.     Conjunctiva/sclera: Conjunctivae normal.  Neck:     Musculoskeletal: Normal range of motion and neck supple.  Cardiovascular:     Rate and Rhythm: Normal rate and regular rhythm.     Heart sounds: Normal heart sounds.  Pulmonary:     Effort: Pulmonary effort is normal.      Breath sounds: Normal breath sounds.  Musculoskeletal: Normal range of motion.  Skin:    General: Skin is warm.     Comments: Small ulceration on bridge of nose, no active bleeding or drainage, significant surrounding erythema  Neurological:     Mental Status: She is alert and oriented to person, place, and time.  Psychiatric:        Mood and Affect: Mood normal.        Thought Content: Thought content normal.        Judgment: Judgment normal.     Results for orders placed or performed in visit on 03/13/19  Comprehensive metabolic panel  Result Value Ref Range   Glucose 114 (H) 65 - 99 mg/dL   BUN 13 8 - 27 mg/dL   Creatinine, Ser 1.26 (H) 0.57 - 1.00 mg/dL   GFR calc non Af Amer 46 (L) >59 mL/min/1.73   GFR calc Af Amer 53 (L) >59 mL/min/1.73   BUN/Creatinine Ratio 10 (L) 12 - 28   Sodium 140 134 - 144 mmol/L   Potassium 4.1 3.5 - 5.2 mmol/L   Chloride 104 96 - 106 mmol/L   CO2 24 20 - 29 mmol/L   Calcium 9.2  8.7 - 10.3 mg/dL   Total Protein 6.4 6.0 - 8.5 g/dL   Albumin 4.3 3.8 - 4.8 g/dL   Globulin, Total 2.1 1.5 - 4.5 g/dL   Albumin/Globulin Ratio 2.0 1.2 - 2.2   Bilirubin Total 0.5 0.0 - 1.2 mg/dL   Alkaline Phosphatase 105 39 - 117 IU/L   AST 25 0 - 40 IU/L   ALT 28 0 - 32 IU/L  Lipid Panel w/o Chol/HDL Ratio  Result Value Ref Range   Cholesterol, Total 169 100 - 199 mg/dL   Triglycerides 138 0 - 149 mg/dL   HDL 53 >39 mg/dL   VLDL Cholesterol Cal 28 5 - 40 mg/dL   LDL Calculated 88 0 - 99 mg/dL  HgB A1c  Result Value Ref Range   Hgb A1c MFr Bld 5.8 (H) 4.8 - 5.6 %   Est. average glucose Bld gHb Est-mCnc 120 mg/dL      Assessment & Plan:   Problem List Items Addressed This Visit      Endocrine   DM (diabetes mellitus), type 2 with complications ([place]) - Primary    Tolerating ozempic well so far, will recheck A1C in 2 months when due. Continue current regimen and working on diet and exercise        Other   Morbid obesity with body mass index of  40.0-49.9 (HCC)    Discussed diet, exercise. Continue ozempic, monitor weights closely       Other Visit Diagnoses    External nasal lesion       Continue wound cleansing and neosporin regimen, will refer to Dermatology for bx if not resolving over next week or so as well as annual skin checks   Relevant Orders   Ambulatory referral to Dermatology   Need for influenza vaccination       Relevant Orders   Flu Vaccine QUAD 36+ mos IM (Completed)       Follow up plan: Return in about 2 months (around 09/27/2019) for Weight, DM f/u.

## 2019-08-01 NOTE — Assessment & Plan Note (Signed)
Tolerating ozempic well so far, will recheck A1C in 2 months when due. Continue current regimen and working on diet and exercise

## 2019-08-01 NOTE — Assessment & Plan Note (Signed)
Discussed diet, exercise. Continue ozempic, monitor weights closely

## 2019-09-02 ENCOUNTER — Ambulatory Visit: Payer: 59 | Admitting: Internal Medicine

## 2019-09-04 ENCOUNTER — Other Ambulatory Visit: Payer: Self-pay | Admitting: Family Medicine

## 2019-09-04 ENCOUNTER — Other Ambulatory Visit: Payer: Self-pay | Admitting: Cardiovascular Disease

## 2019-09-04 NOTE — Telephone Encounter (Signed)
Requested medication (s) are due for refill today: yes  Requested medication (s) are on the active medication list: yes  Last refill:  06/09/2019  Future visit scheduled: yes  Notes to clinic: refill cannot be delegated    Requested Prescriptions  Pending Prescriptions Disp Refills   pregabalin (LYRICA) 150 MG capsule [Pharmacy Med Name: PREGABALIN 150 MG CAPSULE] 180 capsule     Sig: Take 2 capsules (300 mg total) by mouth daily.      Not Delegated - Neurology:  Anticonvulsants - Controlled Failed - 09/04/2019 11:06 AM      Failed - This refill cannot be delegated      Passed - Valid encounter within last 12 months    Recent Outpatient Visits           1 month ago DM (diabetes mellitus), type 2 with complications [facility])   [facility] Volney American, PA-C   2 months ago DM (diabetes mellitus), type 2 with complications [facility])   [place], [place], PA-C   5 months ago DM (diabetes mellitus), type 2 with complications [facility])   [facility] Volney American, [place]   1 year ago Essential (primary) hypertension   [place], [place], [place]   1 year ago DM (diabetes mellitus), type 2 with complications [facility])   [facility] Volney American, [place]       Future Appointments             In 3 weeks Orene Desanctis, Lilia Argue, [place], [facility]

## 2019-09-04 NOTE — Telephone Encounter (Signed)
Routing to provider  

## 2019-09-05 ENCOUNTER — Ambulatory Visit: Payer: 59 | Admitting: Internal Medicine

## 2019-09-12 ENCOUNTER — Other Ambulatory Visit: Payer: Self-pay | Admitting: Family Medicine

## 2019-09-29 ENCOUNTER — Telehealth: Payer: Self-pay | Admitting: Family Medicine

## 2019-09-29 DIAGNOSIS — E118 Type 2 diabetes mellitus with unspecified complications: Secondary | ICD-10-CM

## 2019-09-29 NOTE — Telephone Encounter (Signed)
Copied from [place] [id number]. Topic: General - Inquiry >> Sep 29, 2019  2:26 PM Richardo Priest, NT wrote: Reason for CRM: Pt called in stating she would like to have her A1C order placed so she can have it done before appointment. Please advise. >> Sep 29, 2019  4:50 PM Georgina Peer, CMA wrote: Routing to provider. Can the patient have the order done the day before? The appointment she has is for a virtual appointment. Please enter future orders if so.

## 2019-09-29 NOTE — Telephone Encounter (Signed)
Will leave for Apolonio Schneiders to decide

## 2019-09-30 ENCOUNTER — Ambulatory Visit: Payer: 59 | Admitting: Family Medicine

## 2019-09-30 NOTE — Telephone Encounter (Signed)
Order has been placed, just make sure it's done no more than 7 days prior to visit or it may not get covered by her insurance

## 2019-10-01 NOTE — Telephone Encounter (Signed)
Appt scheduled

## 2019-10-06 ENCOUNTER — Other Ambulatory Visit: Payer: 59

## 2019-10-07 ENCOUNTER — Other Ambulatory Visit: Payer: Self-pay

## 2019-10-07 ENCOUNTER — Ambulatory Visit (INDEPENDENT_AMBULATORY_CARE_PROVIDER_SITE_OTHER): Payer: 59 | Admitting: Family Medicine

## 2019-10-07 ENCOUNTER — Encounter: Payer: Self-pay | Admitting: Family Medicine

## 2019-10-07 VITALS — BP 160/70 | Ht 66.0 in | Wt 272.0 lb

## 2019-10-07 DIAGNOSIS — Z6841 Body Mass Index (BMI) 40.0 and over, adult: Secondary | ICD-10-CM

## 2019-10-07 DIAGNOSIS — E118 Type 2 diabetes mellitus with unspecified complications: Secondary | ICD-10-CM

## 2019-10-07 DIAGNOSIS — Z1239 Encounter for other screening for malignant neoplasm of breast: Secondary | ICD-10-CM | POA: Diagnosis not present

## 2019-10-07 MED ORDER — OZEMPIC (0.25 OR 0.5 MG/DOSE) 2 MG/1.5ML ~~LOC~~ SOPN: 0.2500 mg | PEN_INJECTOR | SUBCUTANEOUS | 2 refills | Status: DC

## 2019-10-07 NOTE — Progress Notes (Signed)
BP (!) 160/70   Ht 5' 6"  (1.676 m)   Wt 272 lb (123.4 kg)   BMI 43.90 kg/m    Subjective:    Patient ID: Angela Mullen, female    DOB: [birth year], 65 y.o.   MRN: [id number]  HPI: Angela Mullen is a 64 y.o. female  Chief Complaint  Patient presents with  . Diabetes  . Weight Check    . This visit was completed via WebEx due to the restrictions of the COVID-19 pandemic. All issues as above were discussed and addressed. Physical exam was done as above through visual confirmation on WebEx. If it was felt that the patient should be evaluated in the office, they were directed there. The patient verbally consented to this visit. . Location of the patient: home . Location of the provider: home . Those involved with this call:  . Provider: Merrie Roof, PA-C . CMA: Lesle Chris, [place] . Front Desk/Registration: Jill Side  . Time spent on call: 15 minutes with patient face to face via video conference. More than 50% of this time was spent in counseling and coordination of care. 5 minutes total spent in review of patient's record and preparation of their chart. I verified patient identity using two factors (patient name and date of birth). Patient consents verbally to being seen via telemedicine visit today.   Presenting today for DM and weight f/u. Down almost 10 lb since starting the ozempic. Home BSs running around 100-124 typically. Tolerating the medication well, no side effects. Has been trying hard to improve diet and exercise habits. Denies low blood sugar spells.   Relevant past medical, surgical, family and social history reviewed and updated as indicated. Interim medical history since our last visit reviewed. Allergies and medications reviewed and updated.  Review of Systems  Per HPI unless specifically indicated above     Objective:    BP (!) 160/70   Ht 5' 6"  (1.676 m)   Wt 272 lb (123.4 kg)   BMI 43.90 kg/m   Wt Readings from Last 3 Encounters:  10/07/19  272 lb (123.4 kg)  06/26/19 281 lb (127.5 kg)  06/25/19 278 lb (126.1 kg)    Physical Exam Vitals and nursing note reviewed.  Constitutional:      General: She is not in acute distress.    Appearance: Normal appearance.  HENT:     Head: Atraumatic.     Right Ear: External ear normal.     Left Ear: External ear normal.     Nose: Nose normal. No congestion.     Mouth/Throat:     Mouth: Mucous membranes are moist.     Pharynx: Oropharynx is clear. No posterior oropharyngeal erythema.  Eyes:     Extraocular Movements: Extraocular movements intact.     Conjunctiva/sclera: Conjunctivae normal.  Cardiovascular:     Comments: Unable to assess via virtual visit Pulmonary:     Effort: Pulmonary effort is normal. No respiratory distress.  Musculoskeletal:        General: Normal range of motion.     Cervical back: Normal range of motion.  Skin:    General: Skin is dry.     Findings: No erythema.  Neurological:     Mental Status: She is alert and oriented to person, place, and time.  Psychiatric:        Mood and Affect: Mood normal.        Thought Content: Thought content normal.  Judgment: Judgment normal.     Results for orders placed or performed in visit on 03/13/19  Comprehensive metabolic panel  Result Value Ref Range   Glucose 114 (H) 65 - 99 mg/dL   BUN 13 8 - 27 mg/dL   Creatinine, Ser 1.26 (H) 0.57 - 1.00 mg/dL   GFR calc non Af Amer 46 (L) >59 mL/min/1.73   GFR calc Af Amer 53 (L) >59 mL/min/1.73   BUN/Creatinine Ratio 10 (L) 12 - 28   Sodium 140 134 - 144 mmol/L   Potassium 4.1 3.5 - 5.2 mmol/L   Chloride 104 96 - 106 mmol/L   CO2 24 20 - 29 mmol/L   Calcium 9.2 8.7 - 10.3 mg/dL   Total Protein 6.4 6.0 - 8.5 g/dL   Albumin 4.3 3.8 - 4.8 g/dL   Globulin, Total 2.1 1.5 - 4.5 g/dL   Albumin/Globulin Ratio 2.0 1.2 - 2.2   Bilirubin Total 0.5 0.0 - 1.2 mg/dL   Alkaline Phosphatase 105 39 - 117 IU/L   AST 25 0 - 40 IU/L   ALT 28 0 - 32 IU/L  Lipid Panel w/o  Chol/HDL Ratio  Result Value Ref Range   Cholesterol, Total 169 100 - 199 mg/dL   Triglycerides 138 0 - 149 mg/dL   HDL 53 >39 mg/dL   VLDL Cholesterol Cal 28 5 - 40 mg/dL   LDL Calculated 88 0 - 99 mg/dL  HgB A1c  Result Value Ref Range   Hgb A1c MFr Bld 5.8 (H) 4.8 - 5.6 %   Est. average glucose Bld gHb Est-mCnc 120 mg/dL      Assessment & Plan:   Problem List Items Addressed This Visit      Endocrine   DM (diabetes mellitus), type 2 with complications ([place])    Recheck A1C, adjust as needed. 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 Congratulated success, continue current regimen and work on diet and exercise changes further      Relevant Medications   Semaglutide,0.25 or 0.5MG/DOS, (OZEMPIC, 0.25 OR 0.5 MG/DOSE,) 2 MG/1.5ML SOPN    Other Visit Diagnoses    Encounter for screening for malignant neoplasm of breast, unspecified screening modality    -  Primary   Relevant Orders   MM Digital Screening       Follow up plan: Return in about 6 months (around 04/05/2020) for 6 month f/u.

## 2019-10-08 NOTE — Assessment & Plan Note (Signed)
Recheck A1C, adjust as needed. Continue current regimen and good lifestyle habits

## 2019-10-08 NOTE — Assessment & Plan Note (Signed)
Down about 10 lb since starting ozempic. Congratulated success, continue current regimen and work on diet and exercise changes further

## 2019-10-09 ENCOUNTER — Encounter: Payer: Self-pay | Admitting: Family Medicine

## 2019-10-09 NOTE — Progress Notes (Signed)
Called pt no answer, left vm, sent my chart message and letter.

## 2019-10-23 ENCOUNTER — Telehealth: Payer: Self-pay | Admitting: Family Medicine

## 2019-10-23 NOTE — Telephone Encounter (Signed)
Lvm for pt to call back to schedule around 04/01/20.

## 2019-10-23 NOTE — Telephone Encounter (Signed)
-----   Message from Volney American, [place] sent at 10/08/2019  6:14 AM EST ----- 6 month f/u

## 2019-10-31 ENCOUNTER — Other Ambulatory Visit: Payer: 59

## 2019-11-03 ENCOUNTER — Telehealth: Payer: 59 | Admitting: Family Medicine

## 2019-11-04 ENCOUNTER — Telehealth: Payer: Self-pay | Admitting: Family Medicine

## 2019-11-04 DIAGNOSIS — E118 Type 2 diabetes mellitus with unspecified complications: Secondary | ICD-10-CM

## 2019-11-04 NOTE — Telephone Encounter (Signed)
Can this be done?

## 2019-11-04 NOTE — Telephone Encounter (Signed)
Called and left patient a VM letting her know that the lab was added on.

## 2019-11-04 NOTE — Addendum Note (Signed)
Addended by: Merrie Roof E on: 11/04/2019 10:58 AM   Modules accepted: Orders

## 2019-11-04 NOTE — Telephone Encounter (Signed)
Pt stated she is coming in for labs tomorrow and wanted to know if another test could be added apart from the a1c she wants to have her Liver functions tested.

## 2019-11-04 NOTE — Telephone Encounter (Signed)
Lab added

## 2019-11-05 ENCOUNTER — Other Ambulatory Visit: Payer: 59

## 2019-11-05 ENCOUNTER — Other Ambulatory Visit: Payer: Self-pay

## 2019-11-05 DIAGNOSIS — E118 Type 2 diabetes mellitus with unspecified complications: Secondary | ICD-10-CM

## 2019-11-06 ENCOUNTER — Telehealth: Payer: 59 | Admitting: Family Medicine

## 2019-11-06 LAB — COMPREHENSIVE METABOLIC PANEL
ALT: 19 IU/L (ref 0–32)
AST: 18 IU/L (ref 0–40)
Albumin/Globulin Ratio: 2 (ref 1.2–2.2)
Albumin: 4.1 g/dL (ref 3.8–4.8)
Alkaline Phosphatase: 100 IU/L (ref 39–117)
BUN/Creatinine Ratio: 9 — ABNORMAL LOW (ref 12–28)
BUN: 11 mg/dL (ref 8–27)
Bilirubin Total: 0.3 mg/dL (ref 0.0–1.2)
CO2: 22 mmol/L (ref 20–29)
Calcium: 9.4 mg/dL (ref 8.7–10.3)
Chloride: 103 mmol/L (ref 96–106)
Creatinine, Ser: 1.22 mg/dL — ABNORMAL HIGH (ref 0.57–1.00)
GFR calc Af Amer: 55 mL/min/{1.73_m2} — ABNORMAL LOW (ref >59)
GFR calc non Af Amer: 48 mL/min/{1.73_m2} — ABNORMAL LOW (ref >59)
Globulin, Total: 2.1 g/dL (ref 1.5–4.5)
Glucose: 99 mg/dL (ref 65–99)
Potassium: 4.7 mmol/L (ref 3.5–5.2)
Sodium: 143 mmol/L (ref 134–144)
Total Protein: 6.2 g/dL (ref 6.0–8.5)

## 2019-11-06 LAB — HEMOGLOBIN A1C
Est. average glucose Bld gHb Est-mCnc: 120 mg/dL
Hgb A1c MFr Bld: 5.8 % — ABNORMAL HIGH (ref 4.8–5.6)

## 2019-11-25 LAB — HM DIABETES EYE EXAM

## 2020-02-09 ENCOUNTER — Other Ambulatory Visit: Payer: Self-pay | Admitting: Family Medicine

## 2020-02-09 ENCOUNTER — Other Ambulatory Visit: Payer: Self-pay | Admitting: Cardiovascular Disease

## 2020-02-09 NOTE — Telephone Encounter (Signed)
Requested medication (s) are due for refill today: yes  Requested medication (s) are on the active medication list: yes  Last refill:  09/05/2019  Future visit scheduled: no  Notes to clinic:  not delegated    Requested Prescriptions  Pending Prescriptions Disp Refills   pregabalin (LYRICA) 150 MG capsule [Pharmacy Med Name: PREGABALIN 150 MG CAPSULE] 180 capsule 1    Sig: TAKE 2 CAPSULES (300 MG TOTAL) BY MOUTH DAILY.      Not Delegated - Neurology:  Anticonvulsants - Controlled Failed - 02/09/2020 10:17 AM      Failed - This refill cannot be delegated      Passed - Valid encounter within last 12 months    Recent Outpatient Visits           4 months ago Encounter for screening for malignant neoplasm of breast, unspecified screening modality   [facility] Volney American, [place]   6 months ago DM (diabetes mellitus), type 2 with complications [facility])   [place], [place], [place]   7 months ago DM (diabetes mellitus), type 2 with complications [facility])   [place], [place], [place]   11 months ago DM (diabetes mellitus), type 2 with complications [facility])   [facility] Volney American, [place]   1 year ago Essential (primary) hypertension   [place], Lilia Argue, [place]       Future Appointments             In 1 week [place], Fonnie Mu, NP [facility]

## 2020-02-09 NOTE — Telephone Encounter (Signed)
Routing to provider  

## 2020-02-16 ENCOUNTER — Ambulatory Visit (INDEPENDENT_AMBULATORY_CARE_PROVIDER_SITE_OTHER): Payer: 59 | Admitting: Adult Health

## 2020-02-16 ENCOUNTER — Encounter: Payer: Self-pay | Admitting: Adult Health

## 2020-02-16 ENCOUNTER — Other Ambulatory Visit: Payer: Self-pay

## 2020-02-16 DIAGNOSIS — G4733 Obstructive sleep apnea (adult) (pediatric): Secondary | ICD-10-CM | POA: Diagnosis not present

## 2020-02-16 DIAGNOSIS — J441 Chronic obstructive pulmonary disease with (acute) exacerbation: Secondary | ICD-10-CM

## 2020-02-16 MED ORDER — ADVAIR HFA 115-21 MCG/ACT IN AERO: INHALATION_SPRAY | RESPIRATORY_TRACT | 3 refills | Status: DC

## 2020-02-16 MED ORDER — ALBUTEROL SULFATE (2.5 MG/3ML) 0.083% IN NEBU: 2.5000 mg | INHALATION_SOLUTION | Freq: Four times a day (QID) | RESPIRATORY_TRACT | 3 refills | Status: DC | PRN

## 2020-02-16 MED ORDER — ALBUTEROL SULFATE HFA 108 (90 BASE) MCG/ACT IN AERS: 1.0000 | INHALATION_SPRAY | Freq: Four times a day (QID) | RESPIRATORY_TRACT | 3 refills | Status: DC | PRN

## 2020-02-16 MED ORDER — SPIRIVA HANDIHALER 18 MCG IN CAPS: ORAL_CAPSULE | RESPIRATORY_TRACT | 3 refills | Status: DC

## 2020-02-16 NOTE — Progress Notes (Signed)
Virtual Visit via Telephone Note  I connected with Angela Mullen on 02/16/20 at  9:30 AM EDT by telephone and verified that I am speaking with the correct person using two identifiers.  Location: Patient: Home  Provider: Office    I discussed the limitations, risks, security and privacy concerns of performing an evaluation and management service by telephone and the availability of in person appointments. I also discussed with the patient that there may be a patient responsible charge related to this service. The patient expressed understanding and agreed to proceed.   History of Present Illness: 64 year old female never smoker followed for COPD and obstructive sleep apnea Medical history significant for morbid obesity status post gastric bypass surgery, diabetes, hyperlipidemia, chronic kidney disease  Today's televisit is a follow-up visit for COPD and obstructive sleep apnea.  Patient was last seen by Dr. Mortimer Fries in September 2020 .  Patient says overall breathing is doing okay.  She does have a daily cough.  Gets short of breath with heavy activity.  Uses her albuterol most days.  Says she has noticed her dry cough is more prevalent in the last several months. Of note patient is on ACE inhibitor. She remains on Advair twice daily and Spiriva daily. Last chest x-ray 2018  showed no acute process.   Patient has underlying obstructive sleep apnea is on nocturnal BiPAP.  Patient says she wears her BiPAP every night.  Gets in at least 8 to 10 hours.  Patient says she feels rested.  Says she cannot sleep without it.  She even uses it if she takes a nap.  She denies any aches excessive daytime sleepiness.  And feels that she benefits from nocturnal BiPAP.  BiPAP download shows excellent compliance with 100% usage.  Daily average usage around 10 hours.  AHI 0.8 patient is on IPAP 18 and EPAP of 14 cm H2O. Patient says her machine is almost 63 years old.  And worries it is getting too old and will  stop working.  Order was placed for a new BiPAP machine.  Patient does tell me that she needs refills today.  Patient is also going to be switching insurances in about 6 weeks and needs to get several of her medical issues taking care of.  Observations/Objective: 02/2017 Spirometry  FVC was 2.51 liters, 80% of predicted FEV1 was 1.78, 71% of predicted FEV1 ratio was 71 FEF 25-75% liters per second was 45% of predicted   Assessment and Plan: History of COPD however patient is a non-smoker.  Could have a component of asthma. will check PFTs .  Continue on current regimen.  Would consider changing ACE inhibitor if possible as she has a daily cough which may be aggravated by her lisinopril. Check chest x-ray on return.  OSA -excellent control on nocturnal BiPAP.  Needs new BiPAP machine.  Order was sent to her homecare company  Plan  Patient Instructions  Continue on Advair and Spiriva .  Set up PFTs  Albuterol Inhaler As needed   Discuss with Primary Provider or Cardiology that Lisinopril may be aggravating your cough .  Continue on BIPAP  At bedtime   Keep up good work .  Order for new BIPAP machine .  Follow up in 1 month in office with chest xray with Dr. Mortimer Fries or APP and As needed   Please contact office for sooner follow up if symptoms do not improve or worsen or seek emergency care  Follow Up Instructions: Follow-up in 1 month and as needed   I discussed the assessment and treatment plan with the patient. The patient was provided an opportunity to ask questions and all were answered. The patient agreed with the plan and demonstrated an understanding of the instructions.   The patient was advised to call back or seek an in-person evaluation if the symptoms worsen or if the condition fails to improve as anticipated.  I provided 22 minutes of non-face-to-face time during this encounter.   Angela Edison, NP

## 2020-02-16 NOTE — Patient Instructions (Addendum)
Continue on Advair and Spiriva .  Set up PFTs  Albuterol Inhaler As needed   Discuss with Primary Provider or Cardiology that Lisinopril may be aggravating your cough .  Continue on BIPAP  At bedtime   Keep up good work .  Order for new BIPAP machine .  Follow up in 1 month in office with chest xray with Dr. Mortimer Fries or APP and As needed   Please contact office for sooner follow up if symptoms do not improve or worsen or seek emergency care

## 2020-03-01 ENCOUNTER — Encounter: Payer: Self-pay | Admitting: Family Medicine

## 2020-03-01 ENCOUNTER — Telehealth: Payer: Self-pay | Admitting: Adult Health

## 2020-03-01 ENCOUNTER — Other Ambulatory Visit: Payer: Self-pay

## 2020-03-01 ENCOUNTER — Ambulatory Visit: Payer: 59 | Admitting: Family Medicine

## 2020-03-01 VITALS — BP 119/73 | HR 52 | Temp 98.5°F | Wt 274.0 lb

## 2020-03-01 DIAGNOSIS — T148XXA Other injury of unspecified body region, initial encounter: Secondary | ICD-10-CM | POA: Diagnosis not present

## 2020-03-01 MED ORDER — CLOTRIMAZOLE-BETAMETHASONE 1-0.05 % EX CREA: 1.0000 "application " | TOPICAL_CREAM | Freq: Two times a day (BID) | CUTANEOUS | 1 refills | Status: DC

## 2020-03-01 NOTE — Progress Notes (Signed)
BP 119/73   Pulse (!) 52   Temp 98.5 F (36.9 C) (Oral)   Wt 274 lb (124.3 kg)   SpO2 94%   BMI 44.22 kg/m    Subjective:    Patient ID: Angela Mullen, female    DOB: 19-Mar-1956, 64 y.o.   MRN: [id number]  HPI: Angela Mullen is a 64 y.o. female  Chief Complaint  Patient presents with  . Skin Problem    pt states had had some bruises on bilateral arms few months ago  . Medication Refill    ozempic, lyrica and methocarbamol refill. [facility]    Presenting today concerned over some bruising on her forearms that's been present for about 3 weeks now with no obvious trigger. No new medication changes, known injury, tenderness. States this has never happened to her before. Denies notice of issues elsewhere with bruising, or other bleeding sources.   Relevant past medical, surgical, family and social history reviewed and updated as indicated. Interim medical history since our last visit reviewed. Allergies and medications reviewed and updated.  Review of Systems  Per HPI unless specifically indicated above     Objective:    BP 119/73   Pulse (!) 52   Temp 98.5 F (36.9 C) (Oral)   Wt 274 lb (124.3 kg)   SpO2 94%   BMI 44.22 kg/m   Wt Readings from Last 3 Encounters:  03/01/20 274 lb (124.3 kg)  10/07/19 272 lb (123.4 kg)  06/26/19 281 lb (127.5 kg)    Physical Exam Vitals and nursing note reviewed.  Constitutional:      Appearance: Normal appearance. She is not ill-appearing.  HENT:     Head: Atraumatic.  Eyes:     Extraocular Movements: Extraocular movements intact.     Conjunctiva/sclera: Conjunctivae normal.  Cardiovascular:     Rate and Rhythm: Normal rate and regular rhythm.     Pulses: Normal pulses.     Heart sounds: Normal heart sounds.  Pulmonary:     Effort: Pulmonary effort is normal.     Breath sounds: Normal breath sounds.  Musculoskeletal:        General: Normal range of motion.     Cervical back: Normal range of motion  and neck supple.  Skin:    General: Skin is warm and dry.     Comments: Mild petechial rash b/l forearms in a focal area of about 4-5 inches in diameter. Nontender, no edema  Neurological:     Mental Status: She is alert and oriented to person, place, and time.  Psychiatric:        Mood and Affect: Mood normal.        Thought Content: Thought content normal.        Judgment: Judgment normal.     Results for orders placed or performed in visit on 03/01/20  CBC with Differential/Platelet  Result Value Ref Range   WBC 6.8 3.4 - 10.8 x10E3/uL   RBC 4.46 3.77 - 5.28 x10E6/uL   Hemoglobin 12.9 11.1 - 15.9 g/dL   Hematocrit 40.3 34.0 - 46.6 %   MCV 90 79 - 97 fL   MCH 28.9 26.6 - 33.0 pg   MCHC 32.0 31 - 35 g/dL   RDW 13.0 11.7 - 15.4 %   Platelets 170 150 - 450 x10E3/uL   Neutrophils 79 Not Estab. %   Lymphs 13 Not Estab. %   Monocytes 5 Not Estab. %   Eos 3  Not Estab. %   Basos 0 Not Estab. %   Neutrophils Absolute 5.3 1 - 7 x10E3/uL   Lymphocytes Absolute 0.9 0 - 3 x10E3/uL   Monocytes Absolute 0.4 0 - 0 x10E3/uL   EOS (ABSOLUTE) 0.2 0.0 - 0.4 x10E3/uL   Basophils Absolute 0.0 0 - 0 x10E3/uL   Immature Granulocytes 0 Not Estab. %   Immature Grans (Abs) 0.0 0.0 - 0.1 x10E3/uL  INR/PT  Result Value Ref Range   INR 0.9 0.9 - 1.2   Prothrombin Time 10.2 9.1 - 12.0 sec      Assessment & Plan:   Problem List Items Addressed This Visit    None    Visit Diagnoses    Bruising    -  Primary   ?from amlodipine vs friction injury - will check labs and continue to monitor. Reassurance given   Relevant Orders   CBC with Differential/Platelet (Completed)   INR/PT (Completed)       Follow up plan: Return in about 4 weeks (around 03/29/2020) for 6 month f/u.

## 2020-03-01 NOTE — Telephone Encounter (Signed)
error 

## 2020-03-02 LAB — CBC WITH DIFFERENTIAL/PLATELET
Basophils Absolute: 0 10*3/uL (ref 0.0–0.2)
Basos: 0 %
EOS (ABSOLUTE): 0.2 10*3/uL (ref 0.0–0.4)
Eos: 3 %
Hematocrit: 40.3 % (ref 34.0–46.6)
Hemoglobin: 12.9 g/dL (ref 11.1–15.9)
Immature Grans (Abs): 0 10*3/uL (ref 0.0–0.1)
Immature Granulocytes: 0 %
Lymphocytes Absolute: 0.9 10*3/uL (ref 0.7–3.1)
Lymphs: 13 %
MCH: 28.9 pg (ref 26.6–33.0)
MCHC: 32 g/dL (ref 31.5–35.7)
MCV: 90 fL (ref 79–97)
Monocytes Absolute: 0.4 10*3/uL (ref 0.1–0.9)
Monocytes: 5 %
Neutrophils Absolute: 5.3 10*3/uL (ref 1.4–7.0)
Neutrophils: 79 %
Platelets: 170 10*3/uL (ref 150–450)
RBC: 4.46 x10E6/uL (ref 3.77–5.28)
RDW: 13 % (ref 11.7–15.4)
WBC: 6.8 10*3/uL (ref 3.4–10.8)

## 2020-03-02 LAB — PROTIME-INR
INR: 0.9 (ref 0.9–1.2)
Prothrombin Time: 10.2 s (ref 9.1–12.0)

## 2020-03-08 ENCOUNTER — Ambulatory Visit: Payer: 59 | Admitting: Family

## 2020-03-17 ENCOUNTER — Ambulatory Visit: Payer: 59 | Admitting: Family

## 2020-03-30 ENCOUNTER — Other Ambulatory Visit (HOSPITAL_COMMUNITY): Payer: 59

## 2020-04-01 ENCOUNTER — Ambulatory Visit: Payer: 59 | Admitting: Family Medicine

## 2020-04-03 ENCOUNTER — Other Ambulatory Visit (HOSPITAL_COMMUNITY): Payer: 59

## 2020-04-06 ENCOUNTER — Other Ambulatory Visit: Payer: Self-pay | Admitting: Family Medicine

## 2020-04-06 ENCOUNTER — Telehealth: Payer: Self-pay | Admitting: Cardiovascular Disease

## 2020-04-06 ENCOUNTER — Telehealth: Payer: Self-pay | Admitting: Internal Medicine

## 2020-04-06 NOTE — Telephone Encounter (Signed)
Requested Prescriptions  Pending Prescriptions Disp Refills   OZEMPIC, 0.25 OR 0.5 MG/DOSE, 2 MG/1.5ML SOPN [Pharmacy Med Name: OZEMPIC 0.25-0.5 MG DOSE PEN] 1.5 pen 2    Sig: INJECT 0.25 MG INTO THE SKIN ONCE A WEEK.     Endocrinology:  Diabetes - GLP-1 Receptor Agonists Passed - 04/06/2020  2:05 PM      Passed - HBA1C is between 0 and 7.9 and within 180 days    Hemoglobin A1C  Date Value Ref Range Status  01/15/2018 5.8  Final   Hgb A1c MFr Bld  Date Value Ref Range Status  11/05/2019 5.8 (H) 4.8 - 5.6 % Final    Comment:             Prediabetes: 5.7 - 6.4          Diabetes: >6.4          Glycemic control for adults with diabetes: <7.0          Passed - Valid encounter within last 6 months    Recent Outpatient Visits          1 month ago [place], [place], [place]   6 months ago Encounter for screening for malignant neoplasm of breast, unspecified screening modality   [facility], [place], [place]   8 months ago DM (diabetes mellitus), type 2 with complications [facility])   [place], [place], [place]   9 months ago DM (diabetes mellitus), type 2 with complications [facility])   [facility] [place], [place]   1 year ago DM (diabetes mellitus), type 2 with complications [facility])   [facility] [place], [place]      Future Appointments            In 2 days Orene Desanctis, Lilia Argue, [place], [place]   In 6 days Jonathon Bellows, MD [place]   In 1 week Gilford Rile, Martie Lee, NP [place], [place]

## 2020-04-06 NOTE — Telephone Encounter (Signed)
   Primary Cardiologist:Timothy Rockey Situ, MD  Chart reviewed as part of pre-operative protocol coverage. Because of Angela Mullen's past medical history and time since last visit, they will require a follow-up visit in order to better assess preoperative cardiovascular risk. She is scheduled to see Laurann Montana, NP 04/13/20.   Pre-op covering staff: - [place] add "pre-op clearance" to the appointment notes so provider is aware. - Please contact requesting surgeon's office via preferred method (i.e, phone, fax) to inform them of need for appointment prior to surgery.  Abigail Butts, PA-C  04/06/2020, 5:28 PM

## 2020-04-06 NOTE — Telephone Encounter (Signed)
   [facility] Pre-operative Risk Assessment    HEARTCARE STAFF: - Please ensure there is not already an duplicate clearance open for this procedure. - Under Visit Info/Reason for Call, type in Other and utilize the format Clearance MM/DD/YY or Clearance TBD. Do not use dashes or single digits. - If request is for dental extraction, please clarify the # of teeth to be extracted.  Request for surgical clearance:  1. What type of surgery is being performed? LT TKA traditional knee  2. When is this surgery scheduled? 04/28/20  3. What type of clearance is required (medical clearance vs. Pharmacy clearance to hold med vs. Both)? both  4. Are there any medications that need to be held prior to surgery and how long? Not listed, please advise if needed  5. Practice name and name of physician performing surgery? [facility] - Dr Kurtis Bushman  What is the office phone number? [phone] ext [phone]   7.   What is the office fax number? [place]: Theodora Blow  8.   Anesthesia type (None, local, MAC, general) ? Local/spinal    Ace Gins 04/06/2020, 2:55 PM  _________________________________________________________________   (provider comments below)

## 2020-04-08 ENCOUNTER — Encounter: Payer: Self-pay | Admitting: Family Medicine

## 2020-04-08 ENCOUNTER — Other Ambulatory Visit: Payer: Self-pay

## 2020-04-08 ENCOUNTER — Ambulatory Visit (INDEPENDENT_AMBULATORY_CARE_PROVIDER_SITE_OTHER): Payer: 59 | Admitting: Family Medicine

## 2020-04-08 VITALS — BP 114/69 | HR 73 | Temp 98.5°F | Wt 263.0 lb

## 2020-04-08 DIAGNOSIS — M159 Polyosteoarthritis, unspecified: Secondary | ICD-10-CM

## 2020-04-08 DIAGNOSIS — I10 Essential (primary) hypertension: Secondary | ICD-10-CM | POA: Diagnosis not present

## 2020-04-08 DIAGNOSIS — G629 Polyneuropathy, unspecified: Secondary | ICD-10-CM

## 2020-04-08 DIAGNOSIS — E785 Hyperlipidemia, unspecified: Secondary | ICD-10-CM

## 2020-04-08 DIAGNOSIS — N1832 Chronic kidney disease, stage 3b: Secondary | ICD-10-CM

## 2020-04-08 DIAGNOSIS — G4733 Obstructive sleep apnea (adult) (pediatric): Secondary | ICD-10-CM

## 2020-04-08 DIAGNOSIS — J449 Chronic obstructive pulmonary disease, unspecified: Secondary | ICD-10-CM

## 2020-04-08 DIAGNOSIS — F324 Major depressive disorder, single episode, in partial remission: Secondary | ICD-10-CM

## 2020-04-08 DIAGNOSIS — K219 Gastro-esophageal reflux disease without esophagitis: Secondary | ICD-10-CM | POA: Diagnosis not present

## 2020-04-08 DIAGNOSIS — E118 Type 2 diabetes mellitus with unspecified complications: Secondary | ICD-10-CM

## 2020-04-08 DIAGNOSIS — E1169 Type 2 diabetes mellitus with other specified complication: Secondary | ICD-10-CM

## 2020-04-08 NOTE — Assessment & Plan Note (Signed)
Stable on CPAP, continue nightly use as well as efforts toward continued weight loss

## 2020-04-08 NOTE — Assessment & Plan Note (Signed)
BPs stable and WNL, continue lisinopril, HCTZ, atenolol regimen and lifestyle modifications

## 2020-04-08 NOTE — Progress Notes (Signed)
BP 114/69   Pulse 73   Temp 98.5 F (36.9 C) (Oral)   Wt 263 lb (119.3 kg)   SpO2 95%   BMI 42.45 kg/m    Subjective:    Patient ID: Angela Mullen, female    DOB: [birth year], 64 y.o.   MRN: [id number]  HPI: Angela Mullen is a 64 y.o. female  Chief Complaint  Patient presents with  . Diabetes   Here today for 6 month f/u chronic conditions  DM - Tolerating ozempic well at low dose, typically sugars around 130s-150s but every once in a while elevated. Has been trying to watch portions and carbs but also under a lot of stress lately. Unable to exercise due to arthritis in knees causing significant pain. Denies low blood sugar spells, polyuria, polyphagia, polydipsia.   HTN - Taking medication faithfully without side effects. Denies CP, SOB, HAs, dizziness. Home BPs running 120s/60s range.   HLD - lovastatin well tolerated, no myalgias, claudication.   Neuropathy - On gabapentin which she states does help with her sxs. Denies side effects or further concerns with this at this time.   Depression and insomnia - followed by Psychiatry, exacerbated currently due to some family stressors but overall seems to be under good control. Denies SI/HI.   OSA - using CPAP nightly with good benefit. Continues to work on weight loss.   COPD - Well controlled on spiriva and albuterol prn, no recent exacerbations.   About to have a left knee replacement next month, chronic OA with severe pain and stiffness in this joint. Taking OTC pain relievers prn.   Depression screen [facility] 2/9 04/08/2020 03/01/2020 08/07/2018  Decreased Interest 0 1 1  Down, Depressed, Hopeless 1 1 1   PHQ - 2 Score 1 2 2   Altered sleeping 0 - 1  Tired, decreased energy 1 - 1  Change in appetite 1 - 1  Feeling bad or failure about yourself  1 - 1  Trouble concentrating 0 - 1  Moving slowly or fidgety/restless 0 - 1  Suicidal thoughts 0 - 0  PHQ-9 Score 4 - 8  Difficult doing work/chores - - -  No flowsheet data  found.  Relevant past medical, surgical, family and social history reviewed and updated as indicated. Interim medical history since our last visit reviewed. Allergies and medications reviewed and updated.  Review of Systems  Per HPI unless specifically indicated above     Objective:    BP 114/69   Pulse 73   Temp 98.5 F (36.9 C) (Oral)   Wt 263 lb (119.3 kg)   SpO2 95%   BMI 42.45 kg/m   Wt Readings from Last 3 Encounters:  04/08/20 263 lb (119.3 kg)  03/01/20 274 lb (124.3 kg)  10/07/19 272 lb (123.4 kg)    Physical Exam Vitals and nursing note reviewed.  Constitutional:      Appearance: Normal appearance. She is obese. She is not ill-appearing.  HENT:     Head: Atraumatic.  Eyes:     Extraocular Movements: Extraocular movements intact.     Conjunctiva/sclera: Conjunctivae normal.  Cardiovascular:     Rate and Rhythm: Normal rate and regular rhythm.     Heart sounds: Normal heart sounds.  Pulmonary:     Effort: Pulmonary effort is normal.     Breath sounds: Normal breath sounds.  Abdominal:     General: Bowel sounds are normal. There is no distension.     Palpations: Abdomen is soft.  Tenderness: There is no abdominal tenderness. There is no guarding.  Musculoskeletal:        General: Normal range of motion.     Cervical back: Normal range of motion and neck supple.  Skin:    General: Skin is warm and dry.  Neurological:     Mental Status: She is alert and oriented to person, place, and time.  Psychiatric:        Mood and Affect: Mood normal.        Thought Content: Thought content normal.        Judgment: Judgment normal.     Results for orders placed or performed in visit on 03/01/20  CBC with Differential/Platelet  Result Value Ref Range   WBC 6.8 3.4 - 10.8 x10E3/uL   RBC 4.46 3.77 - 5.28 x10E6/uL   Hemoglobin 12.9 11.1 - 15.9 g/dL   Hematocrit 40.3 34.0 - 46.6 %   MCV 90 79 - 97 fL   MCH 28.9 26.6 - 33.0 pg   MCHC 32.0 31 - 35 g/dL   RDW  13.0 11.7 - 15.4 %   Platelets 170 150 - 450 x10E3/uL   Neutrophils 79 Not Estab. %   Lymphs 13 Not Estab. %   Monocytes 5 Not Estab. %   Eos 3 Not Estab. %   Basos 0 Not Estab. %   Neutrophils Absolute 5.3 1 - 7 x10E3/uL   Lymphocytes Absolute 0.9 0 - 3 x10E3/uL   Monocytes Absolute 0.4 0 - 0 x10E3/uL   EOS (ABSOLUTE) 0.2 0.0 - 0.4 x10E3/uL   Basophils Absolute 0.0 0 - 0 x10E3/uL   Immature Granulocytes 0 Not Estab. %   Immature Grans (Abs) 0.0 0.0 - 0.1 x10E3/uL  INR/PT  Result Value Ref Range   INR 0.9 0.9 - 1.2   Prothrombin Time 10.2 9.1 - 12.0 sec      Assessment & Plan:   Problem List Items Addressed This Visit      Cardiovascular and Mediastinum   Essential (primary) hypertension - Primary    BPs stable and WNL, continue lisinopril, HCTZ, atenolol regimen and lifestyle modifications      Relevant Orders   Comprehensive metabolic panel     Respiratory   Chronic obstructive pulmonary disease (HCC)    Stable on current inhaler regimen (advair, spiriva, albuterol prn). Continue current regimen      OSA (obstructive sleep apnea)    Stable on CPAP, continue nightly use as well as efforts toward continued weight loss        Digestive   GERD (gastroesophageal reflux disease)    Stable and well controlled without breakthrough sxs, continue PPI therapy        Endocrine   Hyperlipidemia associated with type 2 diabetes mellitus (HCC)    Recheck lipids, continue lovastatin and lifestyle modifications      Relevant Orders   Lipid Panel w/o Chol/HDL Ratio   DM (diabetes mellitus), type 2 with complications (HCC)    Recheck A1C, adjust as needed. Continue ozempic and will likely need to increase dose . Lifestyle changes reviewed      Relevant Orders   HgB A1c     Nervous and Auditory   Polyneuropathy    Stable, continue gabapentin regimen        Musculoskeletal and Integument   Generalized OA    Awaiting knee replacement next month, continue OTC pain  relievers and efforts toward weight loss additionally        Genitourinary  Chronic kidney disease (CKD), stage III (moderate)     Other   Depression, major, single episode, in partial remission ([place])    Stable, followed by Psychiatry. Continue per their recommendations      Morbid obesity with body mass index of 40.0-49.9 (HCC)    Diet and exercise reviewed, continue ozempic and will likely increase dose          Follow up plan: Return in about 6 months (around 10/09/2020) for CPE.

## 2020-04-08 NOTE — Assessment & Plan Note (Signed)
Stable and well controlled without breakthrough sxs, continue PPI therapy

## 2020-04-08 NOTE — Assessment & Plan Note (Signed)
Awaiting knee replacement next month, continue OTC pain relievers and efforts toward weight loss additionally

## 2020-04-08 NOTE — Assessment & Plan Note (Signed)
Stable, followed by Psychiatry. Continue per their recommendations

## 2020-04-08 NOTE — Assessment & Plan Note (Signed)
Recheck A1C, adjust as needed. Continue ozempic and will likely need to increase dose . Lifestyle changes reviewed

## 2020-04-08 NOTE — Assessment & Plan Note (Signed)
Stable on current inhaler regimen (advair, spiriva, albuterol prn). Continue current regimen

## 2020-04-08 NOTE — Assessment & Plan Note (Signed)
Recheck lipids, continue lovastatin and lifestyle modifications

## 2020-04-08 NOTE — Assessment & Plan Note (Signed)
Stable, continue gabapentin regimen

## 2020-04-08 NOTE — Assessment & Plan Note (Signed)
Diet and exercise reviewed, continue ozempic and will likely increase dose

## 2020-04-09 LAB — COMPREHENSIVE METABOLIC PANEL
ALT: 20 IU/L (ref 0–32)
AST: 19 IU/L (ref 0–40)
Albumin/Globulin Ratio: 2.1 (ref 1.2–2.2)
Albumin: 4.1 g/dL (ref 3.8–4.8)
Alkaline Phosphatase: 115 IU/L (ref 48–121)
BUN/Creatinine Ratio: 14 (ref 12–28)
BUN: 22 mg/dL (ref 8–27)
Bilirubin Total: 0.5 mg/dL (ref 0.0–1.2)
CO2: 23 mmol/L (ref 20–29)
Calcium: 9.2 mg/dL (ref 8.7–10.3)
Chloride: 104 mmol/L (ref 96–106)
Creatinine, Ser: 1.54 mg/dL — ABNORMAL HIGH (ref 0.57–1.00)
GFR calc Af Amer: 41 mL/min/{1.73_m2} — ABNORMAL LOW (ref >59)
GFR calc non Af Amer: 36 mL/min/{1.73_m2} — ABNORMAL LOW (ref >59)
Globulin, Total: 2 g/dL (ref 1.5–4.5)
Glucose: 88 mg/dL (ref 65–99)
Potassium: 4.1 mmol/L (ref 3.5–5.2)
Sodium: 140 mmol/L (ref 134–144)
Total Protein: 6.1 g/dL (ref 6.0–8.5)

## 2020-04-09 LAB — LIPID PANEL W/O CHOL/HDL RATIO
Cholesterol, Total: 145 mg/dL (ref 100–199)
HDL: 53 mg/dL (ref >39)
LDL Chol Calc (NIH): 72 mg/dL (ref 0–99)
Triglycerides: 108 mg/dL (ref 0–149)
VLDL Cholesterol Cal: 20 mg/dL (ref 5–40)

## 2020-04-09 LAB — HEMOGLOBIN A1C
Est. average glucose Bld gHb Est-mCnc: 123 mg/dL
Hgb A1c MFr Bld: 5.9 % — ABNORMAL HIGH (ref 4.8–5.6)

## 2020-04-12 ENCOUNTER — Ambulatory Visit: Payer: 59 | Admitting: Gastroenterology

## 2020-04-12 NOTE — Progress Notes (Signed)
Office Visit    Patient Name: Angela Mullen Date of Encounter: 04/13/2020  Primary Care Provider:  Volney American, PA-C Primary Cardiologist:  Ida Rogue, MD Electrophysiologist:  None   Chief Complaint    Angela Mullen is a 64 y.o. female with a hx of HTN, HLD, DM2, obesity s/p gastric bypass, CKDIII, OSA, GERD,  COPD presents today for preop for left TKA with Dr. Kurtis Bushman of [facility].   Past Medical History    Past Medical History:  Diagnosis Date   Abnormal liver enzymes 09/03/2014   Acid reflux    Allergy    Anxiety    Arthritis    Cataract    COPD (chronic obstructive pulmonary disease) (HCC)    Depression    Elevation of level of transaminase or lactic acid dehydrogenase (LDH) 08/27/2012   Fatty liver    High blood pressure    High cholesterol    Sleep apnea    Past Surgical History:  Procedure Laterality Date   ABDOMINAL HYSTERECTOMY     BARIATRIC SURGERY  06/15/2017   CARDIAC CATHETERIZATION     COLONOSCOPY WITH PROPOFOL N/A 10/10/2018   Procedure: COLONOSCOPY WITH PROPOFOL;  Surgeon: Jonathon Bellows, MD;  Location: [facility] ENDOSCOPY;  Service: Gastroenterology;  Laterality: N/A;   FRACTURE SURGERY     KNEE SURGERY     TONSILLECTOMY     TUBAL LIGATION      Allergies  Allergies  Allergen Reactions   Codeine Hives and Nausea Only   Ketorolac Other (See Comments)    Altered Mental Status   Prednisone Other (See Comments)    "wheezing"   Gabapentin Nausea Only and Other (See Comments)    'weird dreams' increased appetite hallucinations   Nsaids Diarrhea    History of Present Illness    Angela Mullen is a 64 y.o. female with a hx of f HTN, HLD, DM2, obesity s/p gastric bypass, CKDIII, OSA, GERD,  COPD. She was last seen 05/2019 by Dr. Rockey Situ via telemedicine.   Per previous documentation she had cardiac catheterization October 2015 revealing normal coronary anatomy and normal LV function.  This was  performed at [facility] and is unfortunately unavailable in [place].  She had echocardiogram 03/2017 with EF 55%, mild LVH, mild aortic stenosis with mean gradient of 13 mmHg.  Oldest stepson had a stoke a week ago and is presently hospitalized still with memory issues.  Offered my condolences.  She shares with me that this has been a very stressful time understandably.  She has upcoming appointment with her therapist.  She has noted some intermittent palpitations not associated with pain or shortness of breath.  We discussed that stress could trigger palpitations and reassurance was provided that her EKG today was without arrhythmia.  No formal exercise routine due to limitations from orthopedic issues.  She does try to stay active around the house.  Endorses eating a low-sodium, heart healthy diet.  She has been very careful about her dietary intake since her gastric bypass.  Reports no shortness of breath at rest reports dyspnea on exertion with only more than usual activity. Reports no chest pain, pressure, or tightness. No edema, orthopnea, PND. Reports no dizziness, near-syncope, syncope.  She will have occasional lightheadedness associated with quick position changes and we reviewed orthostatic hypotension. She does endorse not drinking as much water as she should.    EKGs/Labs/Other Studies Reviewed:   The following studies were reviewed today:  Echocardiogram 04/18/2017 via  care everywhere EF 55%   NORMAL LEFT VENTRICULAR SYSTOLIC FUNCTION WITH MILD LVH    NORMAL LA PRESSURES WITH NORMAL DIASTOLIC FUNCTION    NORMAL RIGHT VENTRICULAR SYSTOLIC FUNCTION    VALVULAR REGURGITATION: MILD AR, TRIVIAL PR, TRIVIAL TR    VALVULAR STENOSIS: MILD AS (Mean gradient 80m Hg)   MILD AS.    NO PRIOR STUDY FOR COMPARISON   EKG:  EKG is  ordered today.  The ekg ordered today demonstrates sinus rhythm 62 bpm with first-degree AV block PR 226 no acute ST/T wave changes.  Recent Labs: 03/01/2020:  Hemoglobin 12.9; Platelets 170 04/08/2020: ALT 20; BUN 22; Creatinine, Ser 1.54; Potassium 4.1; Sodium 140  Recent Lipid Panel    Component Value Date/Time   CHOL 145 04/08/2020 0955   TRIG 108 04/08/2020 0955   HDL 53 04/08/2020 0955   CHOLHDL 3.9 06/04/2018 0948   LDLCALC 72 04/08/2020 0955    Home Medications   Current Meds  Medication Sig   acetaminophen (TYLENOL) 500 MG tablet Take 1,000 mg by mouth every 6 (six) hours as needed for moderate pain.    ADVAIR HFA 115-21 MCG/ACT inhaler INHALE 2 PUFFS BY MOUTH EVERY 12 HOURS (Patient taking differently: Inhale 2 puffs into the lungs 2 (two) times daily as needed (shortness of breath). )   albuterol (PROVENTIL) (2.5 MG/3ML) 0.083% nebulizer solution Take 3 mLs (2.5 mg total) by nebulization every 6 (six) hours as needed for wheezing or shortness of breath.   albuterol (VENTOLIN HFA) 108 (90 Base) MCG/ACT inhaler Inhale 1-2 puffs into the lungs every 6 (six) hours as needed for wheezing or shortness of breath.   amLODipine (NORVASC) 10 MG tablet TAKE 1 TABLET (10 MG TOTAL) BY MOUTH ONCE DAILY. (Patient taking differently: Take 10 mg by mouth daily. )   atenolol (TENORMIN) 50 MG tablet Take 1 tablet (50 mg) in the morning & 2 tablets (100 mg) in the evening. (Patient taking differently: Take 50-100 mg by mouth See admin instructions. Take 50 mg in the morning and 100 mg in the evening.)   buPROPion (WELLBUTRIN SR) 150 MG 12 hr tablet Take 150 mg by mouth 2 (two) times daily.    clotrimazole-betamethasone (LOTRISONE) cream Apply 1 application topically 2 (two) times daily. (Patient taking differently: Apply 1 application topically daily as needed (irritation). )   DULoxetine (CYMBALTA) 30 MG capsule Take 30 mg by mouth at bedtime.    DULoxetine (CYMBALTA) 60 MG capsule Take 60 mg by mouth at bedtime.    hydrochlorothiazide (HYDRODIURIL) 25 MG tablet TAKE 1 TABLET (25 MG TOTAL) BY MOUTH EVERY MORNING. (Patient taking differently:  Take 25 mg by mouth daily. )   lisinopril (ZESTRIL) 20 MG tablet TAKE 1 TABLET BY MOUTH EVERY DAY (Patient taking differently: Take 20 mg by mouth daily. )   lovastatin (MEVACOR) 40 MG tablet TAKE 1 TABLET BY MOUTH EVERYDAY AT BEDTIME (Patient taking differently: Take 40 mg by mouth at bedtime. )   methocarbamol (ROBAXIN) 750 MG tablet Take 1 tablet (750 mg total) by mouth every 8 (eight) hours as needed.   omeprazole (PRILOSEC) 40 MG capsule Take 1 capsule (40 mg total) by mouth daily.   OZEMPIC, 0.25 OR 0.5 MG/DOSE, 2 MG/1.5ML SOPN INJECT 0.25 MG INTO THE SKIN ONCE A WEEK.   pregabalin (LYRICA) 150 MG capsule TAKE 2 CAPSULES (300 MG TOTAL) BY MOUTH DAILY. (Patient taking differently: Take 300 mg by mouth at bedtime. )   SPIRIVA HANDIHALER 18 MCG inhalation capsule INHALE  ONE PUFF BY MOUTH ONCE DAILY (Patient taking differently: Place 18 mcg into inhaler and inhale daily as needed (wheezing). )   traZODone (DESYREL) 100 MG tablet Take 100 mg by mouth at bedtime.   VYVANSE 30 MG capsule Take 30 mg by mouth daily.     Review of Systems    Review of Systems  Constitutional: Negative for chills, fever and malaise/fatigue.  Cardiovascular: Positive for palpitations. Negative for chest pain, dyspnea on exertion, leg swelling, near-syncope, orthopnea and syncope.  Respiratory: Negative for cough, shortness of breath and wheezing.   Gastrointestinal: Negative for nausea and vomiting.  Neurological: Negative for dizziness, light-headedness and weakness.   All other systems reviewed and are otherwise negative except as noted above.  Physical Exam    VS:  BP 124/70 (BP Location: Left Arm, Patient Position: Sitting, Cuff Size: Large)    Pulse 62    Ht 5' 4"  (1.626 m)    Wt 261 lb 8 oz (118.6 kg)    SpO2 98%    BMI 44.89 kg/m  , BMI Body mass index is 44.89 kg/m. GEN: Well nourished, overweight, well developed, in no acute distress. HEENT: normal. Neck: Supple, no JVD, carotid bruits, or  masses. Cardiac: RRR, no murmurs, rubs, or gallops. No clubbing, cyanosis, edema.  Radials/DP/PT 2+ and equal bilaterally.  Respiratory:  Respirations regular and unlabored, clear to auscultation bilaterally. GI: Soft, nontender, nondistended, BS + x 4. MS: No deformity or atrophy. Skin: Warm and dry, no rash. Neuro:  Strength and sensation are intact. Psych: Normal affect.  Assessment & Plan    1. Preoperative cardiovascular examination -  Upcoming knee surgery with Dr. Harlow Mares of [facility]. According to the Revised Cardiac Risk Index (RCRI), her Perioperative Risk of Major Cardiac Event is (%): 0.4. Her  Functional Capacity in METs is: 5.07 according to the Duke Activity Status Index (DASI). Her cardiac history includes HTN and HLD which are both well controlled on her current medication regimens. Her EKG today is without acute ST/T wave changes.  Based on AHA/ACC guidelines she is deemed acceptable cardiovascular risk to proceed with her upcoming procedure without further cardiovascular testing.  She does not take any cardiac medications that would need to be held prior to the procedure.  2. HTN - BP well controlled. Continue current antihypertensive regimen.  Including Amlodipine, Atenolol, HCTZ, Lisinopril. Refills provided today.   3. Palpitations -reports intermittent palpitations.  Likely triggered by recent stress of her stepsons stroke and hospitalization.  Continue atenolol at present dose, refills provided.    4. HLD - Lipid panel 04/08/20 with LDL 72. Continue Lovastatin 37m daily.   5. Mild aortic stenosis by echo 2018 with mean gradient of 30 mmHg -reports no symptoms concerning for worsening valvular dysfunction.  No murmur noted on exam.  Continue optimal blood pressure control.  Monitor with echocardiogram as needed.  6. OSA - Reports compliance with with CPAP.   7. GERD - Continue Omeprazole, refill provided. Discussed to avoid spicy, fried, acidic foods.   8. CKD III -  Careful titration of diuretics and antihypertensive. Continue to follow with PCP. Continue Lisinopril for renal protection.   Disposition: Follow up in 1 year(s) with Dr. GRockey Situor APP   CLoel Dubonnet NP 04/13/2020, 8:42 AM

## 2020-04-13 ENCOUNTER — Ambulatory Visit (INDEPENDENT_AMBULATORY_CARE_PROVIDER_SITE_OTHER): Payer: 59 | Admitting: Family

## 2020-04-13 ENCOUNTER — Other Ambulatory Visit: Payer: Self-pay

## 2020-04-13 ENCOUNTER — Encounter: Payer: Self-pay | Admitting: Family

## 2020-04-13 VITALS — BP 124/70 | HR 62 | Ht 64.0 in | Wt 261.5 lb

## 2020-04-13 DIAGNOSIS — R002 Palpitations: Secondary | ICD-10-CM | POA: Diagnosis not present

## 2020-04-13 DIAGNOSIS — Z01810 Encounter for preprocedural cardiovascular examination: Secondary | ICD-10-CM

## 2020-04-13 DIAGNOSIS — K219 Gastro-esophageal reflux disease without esophagitis: Secondary | ICD-10-CM

## 2020-04-13 DIAGNOSIS — E782 Mixed hyperlipidemia: Secondary | ICD-10-CM

## 2020-04-13 DIAGNOSIS — I10 Essential (primary) hypertension: Secondary | ICD-10-CM

## 2020-04-13 MED ORDER — LOVASTATIN 40 MG PO TABS: 40.0000 mg | ORAL_TABLET | Freq: Every day | ORAL | 3 refills | Status: DC

## 2020-04-13 MED ORDER — HYDROCHLOROTHIAZIDE 25 MG PO TABS: 25.0000 mg | ORAL_TABLET | Freq: Every day | ORAL | 3 refills | Status: DC

## 2020-04-13 MED ORDER — AMLODIPINE BESYLATE 10 MG PO TABS: ORAL_TABLET | ORAL | 3 refills | Status: DC

## 2020-04-13 MED ORDER — LISINOPRIL 20 MG PO TABS: 20.0000 mg | ORAL_TABLET | Freq: Every day | ORAL | 3 refills | Status: DC

## 2020-04-13 MED ORDER — ATENOLOL 50 MG PO TABS: ORAL_TABLET | ORAL | 3 refills | Status: DC

## 2020-04-13 MED ORDER — OMEPRAZOLE 40 MG PO CPDR: 40.0000 mg | DELAYED_RELEASE_CAPSULE | Freq: Every day | ORAL | 3 refills | Status: DC

## 2020-04-13 NOTE — Patient Instructions (Addendum)
Medication Instructions:  No medication changes today.   *If you need a refill on your cardiac medications before your next appointment, please call your pharmacy*  Lab Work: No lab work today. Your recent cholesterol numbers looked great!  Testing/Procedures: Your EKG today showed normal sinus rhythm. This is a great result.   Follow-Up: At [facility], you and your health needs are our priority.  As part of our continuing mission to provide you with exceptional heart care, we have created designated Provider Care Teams.  These Care Teams include your primary Cardiologist (physician) and Advanced Practice Providers (APPs -  Physician Assistants and Nurse Practitioners) who all work together to provide you with the care you need, when you need it.  We recommend signing up for the patient portal called "MyChart".  Sign up information is provided on this After Visit Summary.  MyChart is used to connect with patients for Virtual Visits (Telemedicine).  Patients are able to view lab/test results, encounter notes, upcoming appointments, etc.  Non-urgent messages can be sent to your provider as well.   To learn more about what you can do with MyChart, go to [email or web address].    Your next appointment:   1 year(s)  The format for your next appointment:   In Person  Provider:   You may see Ida Rogue, MD or one of the following Advanced Practice Providers on your designated Care Team:    Murray Hodgkins, NP  Christell Faith, PA-C  Marrianne Mood, PA-C  Laurann Montana, NP  Other Instructions We will send a note to your orthopedic surgeon that you are cleared for your knee surgery.  We will be thinking of you and your family.

## 2020-04-14 ENCOUNTER — Ambulatory Visit: Payer: Self-pay | Admitting: Family Medicine

## 2020-04-15 NOTE — Telephone Encounter (Signed)
PFT has been rescheduled for August. Nothing further needed.

## 2020-04-16 ENCOUNTER — Other Ambulatory Visit: Payer: Self-pay

## 2020-04-16 ENCOUNTER — Other Ambulatory Visit: Payer: Self-pay | Admitting: Orthopedic Surgery

## 2020-04-16 ENCOUNTER — Encounter
Admission: RE | Admit: 2020-04-16 | Discharge: 2020-04-16 | Disposition: A | Discharge status: Home / Self Care | Payer: 59 | Source: Ambulatory Visit | Attending: Orthopedic Surgery | Admitting: Orthopedic Surgery

## 2020-04-16 DIAGNOSIS — Z01812 Encounter for preprocedural laboratory examination: Secondary | ICD-10-CM | POA: Insufficient documentation

## 2020-04-16 HISTORY — DX: Anemia, unspecified: D64.9

## 2020-04-16 HISTORY — DX: Dyspnea, unspecified: R06.00

## 2020-04-16 HISTORY — DX: Headache, unspecified: R51.9

## 2020-04-16 HISTORY — DX: Type 2 diabetes mellitus without complications: E11.9

## 2020-04-16 LAB — CBC
HCT: 42.5 % (ref 36.0–46.0)
Hemoglobin: 13.8 g/dL (ref 12.0–15.0)
MCH: 29.5 pg (ref 26.0–34.0)
MCHC: 32.5 g/dL (ref 30.0–36.0)
MCV: 90.8 fL (ref 80.0–100.0)
Platelets: 214 10*3/uL (ref 150–400)
RBC: 4.68 MIL/uL (ref 3.87–5.11)
RDW: 14.2 % (ref 11.5–15.5)
WBC: 8.4 10*3/uL (ref 4.0–10.5)
nRBC: 0 % (ref 0.0–0.2)

## 2020-04-16 LAB — TYPE AND SCREEN
ABO/RH(D): O POS
Antibody Screen: NEGATIVE

## 2020-04-16 LAB — URINALYSIS, ROUTINE W REFLEX MICROSCOPIC
Bilirubin Urine: NEGATIVE
Glucose, UA: NEGATIVE mg/dL
Hgb urine dipstick: NEGATIVE
Ketones, ur: NEGATIVE mg/dL
Leukocytes,Ua: NEGATIVE
Nitrite: NEGATIVE
Protein, ur: NEGATIVE mg/dL
Specific Gravity, Urine: 1.02 (ref 1.005–1.030)
pH: 5 (ref 5.0–8.0)

## 2020-04-16 LAB — SURGICAL PCR SCREEN
MRSA, PCR: NEGATIVE
Staphylococcus aureus: POSITIVE — AB

## 2020-04-16 LAB — PROTIME-INR
INR: 1 (ref 0.8–1.2)
Prothrombin Time: 12.6 seconds (ref 11.4–15.2)

## 2020-04-16 LAB — APTT: aPTT: 27 seconds (ref 24–36)

## 2020-04-16 NOTE — Patient Instructions (Addendum)
Your procedure is scheduled on: 04-28-20 [facility] Report to Same Day Surgery 2nd floor medical mall [facility] Entrance-take elevator on left to 2nd floor.  Check in with surgery information desk.) To find out your arrival time please call [phone] between 1PM - 3PM on  04-27-20 TUESDAY  Remember: Instructions that are not followed completely may result in serious medical risk, up to and including death, or upon the discretion of your surgeon and anesthesiologist your surgery may need to be rescheduled.    _x___ 1. Do not eat food after midnight the night before your procedure. NO GUM OR CANDY AFTER MIDNIGHT. You may drink WATER up to 2 hours before you are scheduled to arrive at the hospital for your procedure.  Do not drink WATER within 2 hours of your scheduled arrival to the hospital.  Type 1 and type 2 diabetics should only drink water.     __x__ 2. No Alcohol for 24 hours before or after surgery.   __x__3. No Smoking or e-cigarettes for 24 prior to surgery.  Do not use any chewable tobacco products for at least 6 hour prior to surgery   ____  4. Bring all medications with you on the day of surgery if instructed.    __x__ 5. Notify your doctor if there is any change in your medical condition     (cold, fever, infections).    x___6. On the morning of surgery brush your teeth with toothpaste and water.  You may rinse your mouth with mouth wash if you wish.  Do not swallow any toothpaste or mouthwash.   Do not wear jewelry, make-up, hairpins, clips or nail polish.  Do not wear lotions, powders, or perfumes.  Do not shave 48 hours prior to surgery. Men may shave face and neck.  Do not bring valuables to the hospital.    [facility] is not responsible for any belongings or valuables.               Contacts, dentures or bridgework may not be worn into surgery.  Leave your suitcase in the car. After surgery it may be brought to your room.  For patients admitted to the hospital,  discharge time is determined by your  treatment team.  _  Patients discharged the day of surgery will not be allowed to drive home.  You will need someone to drive you home and stay with you the night of your procedure.    Please read over the following fact sheets that you were given:   [facility] Preparing for Surgery and MRSA Information/INCENTIVE SPIROMETER INSTRUCTIONS  _x___ TAKE THE FOLLOWING MEDICATION THE MORNING OF SURGERY WITH A SMALL SIP OF WATER. These include:  1. WELLBUTRIN (BUPROPION)  2. AMLODIPINE (NORVASC)  3. ATENOLOL (TENORMIN)  4. PRILOSEC (OMEPRAZOLE)  5. TAKE AN EXTRA OMEPRAZOLE THE NIGHT BEFORE YOUR SURGERY  6.  ____Fleets enema or Magnesium Citrate as directed.   _x___ Use CHG Soap as directed on instruction sheet   _X___ Use inhalers on the day of surgery and bring to hospital day of surgery-USE YOUR ALBUTEROL NEBULIZER THE MORNING OF SURGERY AND BRING YOUR ALBUTEROL INHALER TO [place]  ____ Stop Metformin and Janumet 2 days prior to surgery.    ____ Take 1/2 of usual insulin dose the night before surgery and none on the morning surgery.   ____ Follow recommendations from Cardiologist, Pulmonologist or PCP regarding stopping Aspirin, Coumadin, Plavix ,Eliquis, Effient, or Pradaxa, and Pletal.  X____Stop Anti-inflammatories such  as Advil, Aleve, Ibuprofen, Motrin, Naproxen, Naprosyn, Goodies powders or aspirin products 7 DAYS PRIOR TO SURGERY-OK to take Tylenol    ____ Stop supplements until after surgery.   _X___ Gennaro [place] to the hospital.     [facility] [phone]

## 2020-04-16 NOTE — Progress Notes (Signed)
°  [facility] Perioperative Services: Pre-Admission/Anesthesia Testing  Abnormal Lab Notification  Date: 04/16/20  Name: Angela Mullen MRN:   [id number]  Re: Abnormal labs noted during PAT appointment  Provider Notified: Lovell Sheehan, MD Notification mode: Routed via [facility] of concern: Lab Results  Component Value Date   STAPHAUREUS POSITIVE (A) 04/16/2020   MRSAPCR NEGATIVE 04/16/2020     Honor Loh, MSN, APRN, FNP-C, CEN [place]  Peri-operative Services Nurse Practitioner Phone: [phone] 04/16/20 2:49 PM

## 2020-04-16 NOTE — Pre-Procedure Instructions (Signed)
Loel Dubonnet, NP  Nurse Practitioner  Cardiology  Progress Notes    Signed  Encounter Date:  04/13/2020          Signed      Expand All Collapse All  Show:Clear all [x] Manual[x] Template[x] Copied  Added by: [x] Loel Dubonnet, NP  [] Hover for details    Office Visit    Patient Name: Angela Mullen Date of Encounter: 04/13/2020  Primary Care Provider:  Volney American, PA-C Primary Cardiologist:  Ida Rogue, MD Electrophysiologist:  None   Chief Complaint    Angela Mullen is a 64 y.o. female with a hx of HTN, HLD, DM2, obesity s/p gastric bypass, CKDIII, OSA, GERD,  COPD presents today for preop for left TKA with Dr. Kurtis Bushman of [facility].   Past Medical History        Past Medical History:  Diagnosis Date  . Abnormal liver enzymes 09/03/2014  . Acid reflux   . Allergy   . Anxiety   . Arthritis   . Cataract   . COPD (chronic obstructive pulmonary disease) ([place])   . Depression   . Elevation of level of transaminase or lactic acid dehydrogenase (LDH) 08/27/2012  . Fatty liver   . High blood pressure   . High cholesterol   . Sleep apnea         Past Surgical History:  Procedure Laterality Date  . ABDOMINAL HYSTERECTOMY    . BARIATRIC SURGERY  06/15/2017  . CARDIAC CATHETERIZATION    . COLONOSCOPY WITH PROPOFOL N/A 10/10/2018   Procedure: COLONOSCOPY WITH PROPOFOL;  Surgeon: Jonathon Bellows, MD;  [facility]: [facility] ENDOSCOPY;  Service: Gastroenterology;  Laterality: N/A;  . FRACTURE SURGERY    . KNEE SURGERY    . TONSILLECTOMY    . TUBAL LIGATION      Allergies       Allergies  Allergen Reactions  . Codeine Hives and Nausea Only  . Ketorolac Other (See Comments)    Altered Mental Status  . Prednisone Other (See Comments)    "wheezing"  . Gabapentin Nausea Only and Other (See Comments)    'weird dreams' increased appetite hallucinations  . Nsaids Diarrhea    History of Present  Illness    [name] is a 64 y.o. female with a hx of f HTN, HLD, DM2, obesity s/p gastric bypass, CKDIII, OSA, GERD,  COPD. She was last seen 05/2019 by Dr. Rockey Situ via telemedicine.   Per previous documentation she had cardiac catheterization October 2015 revealing normal coronary anatomy and normal LV function.  This was performed at [facility] and is unfortunately unavailable in [place].  She had echocardiogram 03/2017 with EF 55%, mild LVH, mild aortic stenosis with mean gradient of 13 mmHg.  Oldest stepson had a stoke a week ago and is presently hospitalized still with memory issues.  Offered my condolences.  She shares with me that this has been a very stressful time understandably.  She has upcoming appointment with her therapist.  She has noted some intermittent palpitations not associated with pain or shortness of breath.  We discussed that stress could trigger palpitations and reassurance was provided that her EKG today was without arrhythmia.  No formal exercise routine due to limitations from orthopedic issues.  She does try to stay active around the house.  Endorses eating a low-sodium, heart healthy diet.  She has been very careful about her dietary intake since her gastric bypass.  Reports no shortness of breath at rest reports  dyspnea on exertion with only more than usual activity. Reports no chest pain, pressure, or tightness. No edema, orthopnea, PND. Reports no dizziness, near-syncope, syncope.  She will have occasional lightheadedness associated with quick position changes and we reviewed orthostatic hypotension. She does endorse not drinking as much water as she should.    EKGs/Labs/Other Studies Reviewed:   The following studies were reviewed today:  Echocardiogram 04/18/2017 via [facility] EF 55% NORMAL LEFT VENTRICULAR SYSTOLIC FUNCTION WITH MILD LVH  NORMAL LA PRESSURES WITH NORMAL DIASTOLIC FUNCTION  NORMAL RIGHT VENTRICULAR SYSTOLIC FUNCTION    VALVULAR REGURGITATION: MILD AR, TRIVIAL PR, TRIVIAL TR  VALVULAR STENOSIS: MILD AS (Mean gradient 64m Hg) MILD AS.  NO PRIOR STUDY FOR COMPARISON   EKG:  EKG is  ordered today.  The ekg ordered today demonstrates sinus rhythm 62 bpm with first-degree AV block PR 226 no acute ST/T wave changes.  Recent Labs: 03/01/2020: Hemoglobin 12.9; Platelets 170 04/08/2020: ALT 20; BUN 22; Creatinine, Ser 1.54; Potassium 4.1; Sodium 140  Recent Lipid Panel Labs (Brief)          Component Value Date/Time   CHOL 145 04/08/2020 0955   TRIG 108 04/08/2020 0955   HDL 53 04/08/2020 0955   CHOLHDL 3.9 06/04/2018 0948   LDLCALC 72 04/08/2020 0955      Home Medications   Active Medications      Current Meds  Medication Sig  . acetaminophen (TYLENOL) 500 MG tablet Take 1,000 mg by mouth every 6 (six) hours as needed for moderate pain.   .Marland KitchenADVAIR HFA 115-21 MCG/ACT inhaler INHALE 2 PUFFS BY MOUTH EVERY 12 HOURS (Patient taking differently: Inhale 2 puffs into the lungs 2 (two) times daily as needed (shortness of breath). )  . albuterol (PROVENTIL) (2.5 MG/3ML) 0.083% nebulizer solution Take 3 mLs (2.5 mg total) by nebulization every 6 (six) hours as needed for wheezing or shortness of breath.  .Marland Kitchenalbuterol (VENTOLIN HFA) 108 (90 Base) MCG/ACT inhaler Inhale 1-2 puffs into the lungs every 6 (six) hours as needed for wheezing or shortness of breath.  .Marland KitchenamLODipine (NORVASC) 10 MG tablet TAKE 1 TABLET (10 MG TOTAL) BY MOUTH ONCE DAILY. (Patient taking differently: Take 10 mg by mouth daily. )  . atenolol (TENORMIN) 50 MG tablet Take 1 tablet (50 mg) in the morning & 2 tablets (100 mg) in the evening. (Patient taking differently: Take 50-100 mg by mouth See admin instructions. Take 50 mg in the morning and 100 mg in the evening.)  . buPROPion (WELLBUTRIN SR) 150 MG 12 hr tablet Take 150 mg by mouth 2 (two) times daily.   . clotrimazole-betamethasone (LOTRISONE) cream Apply 1 application  topically 2 (two) times daily. (Patient taking differently: Apply 1 application topically daily as needed (irritation). )  . DULoxetine (CYMBALTA) 30 MG capsule Take 30 mg by mouth at bedtime.   . DULoxetine (CYMBALTA) 60 MG capsule Take 60 mg by mouth at bedtime.   . hydrochlorothiazide (HYDRODIURIL) 25 MG tablet TAKE 1 TABLET (25 MG TOTAL) BY MOUTH EVERY MORNING. (Patient taking differently: Take 25 mg by mouth daily. )  . lisinopril (ZESTRIL) 20 MG tablet TAKE 1 TABLET BY MOUTH EVERY DAY (Patient taking differently: Take 20 mg by mouth daily. )  . lovastatin (MEVACOR) 40 MG tablet TAKE 1 TABLET BY MOUTH EVERYDAY AT BEDTIME (Patient taking differently: Take 40 mg by mouth at bedtime. )  . methocarbamol (ROBAXIN) 750 MG tablet Take 1 tablet (750 mg total) by mouth every 8 (eight)  hours as needed.  Marland Kitchen omeprazole (PRILOSEC) 40 MG capsule Take 1 capsule (40 mg total) by mouth daily.  Marland Kitchen OZEMPIC, 0.25 OR 0.5 MG/DOSE, 2 MG/1.5ML SOPN INJECT 0.25 MG INTO THE SKIN ONCE A WEEK.  . pregabalin (LYRICA) 150 MG capsule TAKE 2 CAPSULES (300 MG TOTAL) BY MOUTH DAILY. (Patient taking differently: Take 300 mg by mouth at bedtime. )  . SPIRIVA HANDIHALER 18 MCG inhalation capsule INHALE ONE PUFF BY MOUTH ONCE DAILY (Patient taking differently: Place 18 mcg into inhaler and inhale daily as needed (wheezing). )  . traZODone (DESYREL) 100 MG tablet Take 100 mg by mouth at bedtime.  Marland Kitchen VYVANSE 30 MG capsule Take 30 mg by mouth daily.       Review of Systems    Review of Systems  Constitutional: Negative for chills, fever and malaise/fatigue.  Cardiovascular: Positive for palpitations. Negative for chest pain, dyspnea on exertion, leg swelling, near-syncope, orthopnea and syncope.  Respiratory: Negative for cough, shortness of breath and wheezing.   Gastrointestinal: Negative for nausea and vomiting.  Neurological: Negative for dizziness, light-headedness and weakness.   All other systems reviewed and are otherwise  negative except as noted above.  Physical Exam    VS:  BP 124/70 (BP Location: Left Arm, Patient Position: Sitting, Cuff Size: Large)   Pulse 62   Ht 5' 4"  (1.626 m)   Wt 261 lb 8 oz (118.6 kg)   SpO2 98%   BMI 44.89 kg/m  , BMI Body mass index is 44.89 kg/m. GEN: Well nourished, overweight, well developed, in no acute distress. HEENT: normal. Neck: Supple, no JVD, carotid bruits, or masses. Cardiac: RRR, no murmurs, rubs, or gallops. No clubbing, cyanosis, edema.  Radials/DP/PT 2+ and equal bilaterally.  Respiratory:  Respirations regular and unlabored, clear to auscultation bilaterally. GI: Soft, nontender, nondistended, BS + x 4. MS: No deformity or atrophy. Skin: Warm and dry, no rash. Neuro:  Strength and sensation are intact. Psych: Normal affect.  Assessment & Plan    1. Preoperative cardiovascular examination -  Upcoming knee surgery with Dr. Harlow Mares of [facility]. According to the Revised Cardiac Risk Index (RCRI), her Perioperative Risk of Major Cardiac Event is (%): 0.4. Her  Functional Capacity in METs is: 5.07 according to the Duke Activity Status Index (DASI). Her cardiac history includes HTN and HLD which are both well controlled on her current medication regimens. Her EKG today is without acute ST/T wave changes.  Based on AHA/ACC guidelines she is deemed acceptable cardiovascular risk to proceed with her upcoming procedure without further cardiovascular testing.  She does not take any cardiac medications that would need to be held prior to the procedure.  2. HTN - BP well controlled. Continue current antihypertensive regimen.  Including Amlodipine, Atenolol, HCTZ, Lisinopril. Refills provided today.   3. Palpitations -reports intermittent palpitations.  Likely triggered by recent stress of her stepsons stroke and hospitalization.  Continue atenolol at present dose, refills provided.    4. HLD - Lipid panel 04/08/20 with LDL 72. Continue Lovastatin 9m daily.    5. Mild aortic stenosis by echo 2018 with mean gradient of 30 mmHg -reports no symptoms concerning for worsening valvular dysfunction.  No murmur noted on exam.  Continue optimal blood pressure control.  Monitor with echocardiogram as needed.  6. OSA - Reports compliance with with CPAP.   7. GERD - Continue Omeprazole, refill provided. Discussed to avoid spicy, fried, acidic foods.   8. CKD III - Careful titration of diuretics and  antihypertensive. Continue to follow with PCP. Continue Lisinopril for renal protection.   Disposition: Follow up in 1 year(s) with Dr. Rockey Situ or APP   Loel Dubonnet, NP 04/13/2020, 8:42 AM        Electronically signed by Loel Dubonnet, NP at 04/13/2020 11:43 AM  Office Visit on 04/13/2020   Office Visit on 04/13/2020     Detailed Report    Note shared with patient

## 2020-04-16 NOTE — Progress Notes (Signed)
[facility] Perioperative Services  Pre-Admission/Anesthesia Testing Clinical Review  Date: 04/16/20  Patient Demographics:  Name: Angela Mullen DOB:   05/02/1956 MRN:   [id number]  Planned Surgical Procedure(s):    Case: [id number] Date/Time: 04/28/20 0815   Procedure: TOTAL KNEE ARTHROPLASTY (Left Knee)   Anesthesia type: Spinal   Pre-op diagnosis: M17.12 Unilateral primary osteoarthritis, left knee   Location: [facility] OR ROOM 02 / [place] ORS FOR ANESTHESIA GROUP   Surgeons: Lovell Sheehan, MD     NOTE: Available PAT nursing documentation and vital signs have been reviewed. Clinical nursing staff has updated patient's PMH/PSHx, current medication list, and drug allergies/intolerances to ensure comprehensive history available to assist in medical decision making as it pertains to the aforementioned surgical procedure and anticipated anesthetic course.   Clinical Discussion:  Angela Mullen is a 64 y.o. female who is submitted for pre-surgical anesthesia review and clearance prior to her undergoing the above procedure. Patient has never been a smoker. Pertinent PMH includes: HTN, HLD, COPD, T2DM, CKD-III, OSAH (requires nocturnal PAP therapy), anemia, elevated LFTs, NASH, DOE, migraines, anxiety, depression, OA.  Patient is followed by cardiology Rockey Situ, MD). She was last seen in the cardiology clinic on 04/13/2020; notes reviewed. Patient under a great deal of stress due to health issues of a family member. Patient denied chest pain/pressure/tightness. Her breathing was at baseline; no SOB at rest; (+) SOB with mild to moderate exertion. Patient was experiencing intermittent episodes of palpitations; felt to be stress induced. No dizziness or feelings of presyncope/syncope. Last cardiac catheterization from 2015 was [facility] at [facility]. TTE in 2018 revealed an LVEF of 55% and mild AS with a mean gradient of 13 mmHg (see below for full results). RCRI = 0.4. Documented  functional capacity of 5.07 (DASI). HTN and HLD well controlled on prescribed interventions. EKG without acute changes. Per cardiology, "based on AHA/ACC guidelines she is deemed acceptable cardiovascular risk to proceed with her upcoming procedure without further cardiovascular testing". This patient is not on daily anticoagulation therapy.  Patient's PCP Orene Desanctis, PA-C) has cleared patient medically for the above procedure as well with a LOW risk stratification.   She denies previous intra-operative complications with anesthesia. She underwent a general anesthetic course here (ASA III) in 09/2018 with no documented complications.   Vitals with BMI 04/16/2020 04/13/2020 04/08/2020  Height - 5' 4"  -  Weight - 261 lbs 8 oz 263 lbs  BMI - 62.83 -  Systolic 151 761 607  Diastolic 52 70 69  Pulse 60 62 73    Providers/Specialists:   PROVIDER ROLE LAST Jayme Cloud, MD Orthopedics (Surgeon) 04/06/2020  Volney American, [place] Primary Care Provider 04/08/2020  Ida Rogue, MD Cardiology 04/13/2020   Allergies:  Codeine, Ketorolac, Prednisone, Gabapentin, and Nsaids  Current Home Medications:   . acetaminophen (TYLENOL) 500 MG tablet  . ADVAIR HFA 115-21 MCG/ACT inhaler  . albuterol (PROVENTIL) (2.5 MG/3ML) 0.083% nebulizer solution  . albuterol (VENTOLIN HFA) 108 (90 Base) MCG/ACT inhaler  . amLODipine (NORVASC) 10 MG tablet  . atenolol (TENORMIN) 50 MG tablet  . buPROPion (WELLBUTRIN SR) 150 MG 12 hr tablet  . clotrimazole-betamethasone (LOTRISONE) cream  . DULoxetine (CYMBALTA) 30 MG capsule  . DULoxetine (CYMBALTA) 60 MG capsule  . hydrochlorothiazide (HYDRODIURIL) 25 MG tablet  . lisinopril (ZESTRIL) 20 MG tablet  . lovastatin (MEVACOR) 40 MG tablet  . methocarbamol (ROBAXIN) 750 MG tablet  . omeprazole (PRILOSEC) 40 MG capsule  . OZEMPIC, 0.25  OR 0.5 MG/DOSE, 2 MG/1.5ML SOPN  . pregabalin (LYRICA) 150 MG capsule  . SPIRIVA HANDIHALER 18 MCG inhalation capsule  .  traZODone (DESYREL) 100 MG tablet  . VYVANSE 30 MG capsule   No current facility-administered medications for this encounter.   History:   Past Medical History:  Diagnosis Date  . Abnormal liver enzymes 09/03/2014  . Acid reflux   . Allergy   . Anemia    H/O  . Anxiety   . Arthritis   . Cataract   . COPD (chronic obstructive pulmonary disease) ([place])   . Depression   . Diabetes mellitus without complication ([place])   . Dyspnea    WITH EXERTION DUE TO COPD  . Elevation of level of transaminase or lactic acid dehydrogenase (LDH) 08/27/2012  . Fatty liver   . Headache    H/O MIGRAINES  . High blood pressure   . High cholesterol   . Sleep apnea    BIPAP   Past Surgical History:  Procedure Laterality Date  . ABDOMINAL HYSTERECTOMY    . BARIATRIC SURGERY  06/15/2017  . CARDIAC CATHETERIZATION    . COLONOSCOPY WITH PROPOFOL N/A 10/10/2018   Procedure: COLONOSCOPY WITH PROPOFOL;  Surgeon: Jonathon Bellows, MD;  Location: [facility] ENDOSCOPY;  Service: Gastroenterology;  Laterality: N/A;  . FRACTURE SURGERY  1990   RIGHT LEG   . KNEE SURGERY    . TONSILLECTOMY    . TUBAL LIGATION     Family History  Problem Relation Age of Onset  . Cerebral aneurysm Mother   . Stroke Father   . Diabetes Father   . Depression Sister   . Obesity Sister   . Kidney disease Sister   . Heart attack Sister   . Stroke Sister   . Hypertension Sister   . Diabetes Brother   . High blood pressure Brother   . Heart attack Maternal Grandfather   . Hyperlipidemia Son   . Stroke Maternal Grandmother   . Diabetes Paternal Grandfather   . Hyperlipidemia Paternal Grandfather   . Heart disease Paternal Grandfather    Social History   Tobacco Use  . Smoking status: Never Smoker  . Smokeless tobacco: Never Used  Vaping Use  . Vaping Use: Never used  Substance Use Topics  . Alcohol use: Yes    Comment: occassional  . Drug use: No    Pertinent Clinical Results:  LABS: Labs reviewed: Acceptable for  surgery.  Hospital Outpatient Visit on 04/16/2020  Component Date Value Ref Range Status  . aPTT 04/16/2020 27  24 - 36 seconds Final   Performed at [facility], [address] [place], [place] [address]  . WBC 04/16/2020 8.4  4.0 - 10.5 K/uL Final  . RBC 04/16/2020 4.68  3.87 - 5.11 MIL/uL Final  . Hemoglobin 04/16/2020 13.8  12.0 - 15.0 g/dL Final  . HCT 04/16/2020 42.5  36 - 46 % Final  . MCV 04/16/2020 90.8  80.0 - 100.0 fL Final  . MCH 04/16/2020 29.5  26.0 - 34.0 pg Final  . MCHC 04/16/2020 32.5  30.0 - 36.0 g/dL Final  . RDW 04/16/2020 14.2  11.5 - 15.5 % Final  . Platelets 04/16/2020 214  150 - 400 K/uL Final  . nRBC 04/16/2020 0.0  0.0 - 0.2 % Final   Performed at [facility], [address], [place], [place] [address]  . Prothrombin Time 04/16/2020 12.6  11.4 - 15.2 seconds Final  . INR 04/16/2020 1.0  0.8 -  1.2 Final   Comment: (NOTE) INR goal varies based on device and disease states. Performed at [facility], [address] [place], [place] [address]   . Color, Urine 04/16/2020 AMBER* YELLOW Final   BIOCHEMICALS MAY BE AFFECTED BY COLOR  . APPearance 04/16/2020 CLOUDY* CLEAR Final  . Specific Gravity, Urine 04/16/2020 1.020  1.005 - 1.030 Final  . pH 04/16/2020 5.0  5.0 - 8.0 Final  . Glucose, UA 04/16/2020 NEGATIVE  NEGATIVE mg/dL Final  . Hgb urine dipstick 04/16/2020 NEGATIVE  NEGATIVE Final  . Bilirubin Urine 04/16/2020 NEGATIVE  NEGATIVE Final  . Ketones, ur 04/16/2020 NEGATIVE  NEGATIVE mg/dL Final  . Protein, ur 04/16/2020 NEGATIVE  NEGATIVE mg/dL Final  . Nitrite 04/16/2020 NEGATIVE  NEGATIVE Final  . Chalmers Guest 04/16/2020 NEGATIVE  NEGATIVE Final   Performed at [facility], [address] [place], [place] [address]  . ABO/RH(D) 04/16/2020 O POS   Final  . Antibody Screen 04/16/2020 NEG   Final  . Sample Expiration 04/16/2020 04/30/2020,2359   Final  . Extend sample reason 04/16/2020    Final                    Value:NO TRANSFUSIONS OR PREGNANCY IN THE PAST 3 MONTHS Performed at [facility], [address] [place], [place] [address]   . MRSA, PCR 04/16/2020 NEGATIVE  NEGATIVE Final  . Staphylococcus aureus 04/16/2020 POSITIVE* NEGATIVE Final   Comment: (NOTE) The Xpert SA Assay (FDA approved for NASAL specimens in patients 66 years of age and older), is one component of a comprehensive surveillance program. It is not intended to diagnose infection nor to guide or monitor treatment. Performed at [facility], [address] [place],  [address]     ECG: Date: 04/13/2020 Time ECG obtained: 0825 AM Rate: 62 bpm Rhythm: SR with first degree AV block Axis (leads I and aVF): Normal Intervals: PR 226 ms. QTc 408 ms. ST segment and T wave changes: No evidence of ST segment elevation or depression Comparison: When compared to tracing from 06/04/2018 the PR interval has prolonged (previously 178 ms).   IMAGING / PROCEDURES: ECHOCARDIOGRAM from 04/18/2017 1. LVEF >55%  2. Normal LV systolic function with mild LVH 3. Normal LA pressures with normal diastolic function 4. Normal RV systolic function 5. Valvular regurgitation: mild AR, trivial PR, trivial TR 6. Valvular stenosis: mild AS (mean gradient 13 mmHg).  7. No prior studies for comparison.   Impression and Plan:  Armanda Forand has been referred for pre-anesthesia review and clearance prior her undergoing the planned anesthetic and procedural courses. Available labs, pertinent testing, and imaging results were personally reviewed by me. This patient has been appropriately cleared by cardiology and internal medicine.   Based on clinical review performed today (04/16/20), barring and significant acute changes in the patient's overall condition, it is anticipated that she will be able to proceed with the planned surgical intervention. Any acute changes in clinical condition may necessitate her procedure  being postponed and/or cancelled. Pre-surgical instructions were reviewed with the patient during her PAT appointment and questions were fielded by PAT clinical staff.  Honor Loh, MSN, APRN, FNP-C, CEN [facility]  Peri-operative Services Nurse Practitioner Phone: [phone] 04/16/20 4:48 PM  NOTE: This note has been prepared using Dragon dictation software. Despite my best ability to proofread, there is always the potential that unintentional transcriptional errors may still occur from this process.

## 2020-04-20 ENCOUNTER — Other Ambulatory Visit: Payer: Self-pay

## 2020-04-20 ENCOUNTER — Other Ambulatory Visit: Payer: Self-pay | Admitting: Orthopedic Surgery

## 2020-04-20 ENCOUNTER — Ambulatory Visit
Admission: RE | Admit: 2020-04-20 | Discharge: 2020-04-20 | Disposition: A | Discharge status: Home / Self Care | Payer: 59 | Source: Ambulatory Visit | Attending: Orthopedic Surgery | Admitting: Orthopedic Surgery

## 2020-04-20 DIAGNOSIS — R2242 Localized swelling, mass and lump, left lower limb: Secondary | ICD-10-CM | POA: Insufficient documentation

## 2020-04-22 ENCOUNTER — Other Ambulatory Visit: Payer: Self-pay | Admitting: Family Medicine

## 2020-04-22 NOTE — Telephone Encounter (Signed)
Routing to provider  

## 2020-04-22 NOTE — Telephone Encounter (Signed)
Medication Refill - Medication: methocarbamol (ROBAXIN) 750 MG tablet [[id number]]      Preferred Pharmacy (with phone number or street name):  [id number] [place] [address] Phone: [phone]: [phone]    Agent: Please be advised that RX refills may take up to 3 business days. We ask that you follow-up with your pharmacy.

## 2020-04-22 NOTE — Telephone Encounter (Signed)
Requested medication (s) are due for refill today: yes  Requested medication (s) are on the active medication list: yes  Last refill:  08/09/2019  Future visit scheduled: yes  Notes to clinic: this refill cannot be delegated    Requested Prescriptions  Pending Prescriptions Disp Refills   methocarbamol (ROBAXIN) 750 MG tablet 90 tablet 3    Sig: Take 1 tablet (750 mg total) by mouth every 8 (eight) hours as needed.      Not Delegated - Analgesics:  Muscle Relaxants Failed - 04/22/2020 11:06 AM      Failed - This refill cannot be delegated      Passed - Valid encounter within last 6 months    Recent Outpatient Visits           2 weeks ago Essential (primary) hypertension   [facility] Volney American, [place]   1 month ago [place], [place], [place]   6 months ago Encounter for screening for malignant neoplasm of breast, unspecified screening modality   [facility] Merrie Roof [place], [place]   8 months ago DM (diabetes mellitus), type 2 with complications [facility])   [facility] Merrie Roof [place], [place]   10 months ago DM (diabetes mellitus), type 2 with complications [facility])   [facility] Volney American, [place]       Future Appointments             In 1 month Jonathon Bellows, MD [place]   In 5 months Orene Desanctis, Lilia Argue, [place], [facility]

## 2020-04-23 MED ORDER — METHOCARBAMOL 750 MG PO TABS: 750.0000 mg | ORAL_TABLET | Freq: Three times a day (TID) | ORAL | 0 refills | Status: DC | PRN

## 2020-04-26 ENCOUNTER — Other Ambulatory Visit: Payer: Self-pay

## 2020-04-26 ENCOUNTER — Other Ambulatory Visit
Admission: RE | Admit: 2020-04-26 | Discharge: 2020-04-26 | Disposition: A | Discharge status: Home / Self Care | Payer: 59 | Source: Ambulatory Visit | Attending: Orthopedic Surgery | Admitting: Orthopedic Surgery

## 2020-04-26 DIAGNOSIS — Z20822 Contact with and (suspected) exposure to covid-19: Secondary | ICD-10-CM | POA: Diagnosis not present

## 2020-04-26 DIAGNOSIS — Z01812 Encounter for preprocedural laboratory examination: Secondary | ICD-10-CM | POA: Diagnosis not present

## 2020-04-26 LAB — SARS CORONAVIRUS 2 (TAT 6-24 HRS): SARS Coronavirus 2: NEGATIVE

## 2020-04-28 ENCOUNTER — Ambulatory Visit: Payer: 59 | Admitting: Urgent Care

## 2020-04-28 ENCOUNTER — Observation Stay: Payer: 59

## 2020-04-28 ENCOUNTER — Ambulatory Visit: Payer: 59 | Admitting: Certified Registered Nurse Anesthetist

## 2020-04-28 ENCOUNTER — Other Ambulatory Visit: Payer: Self-pay

## 2020-04-28 ENCOUNTER — Observation Stay
Admission: RE | Admit: 2020-04-28 | Discharge: 2020-04-29 | Disposition: A | Discharge status: Home / Self Care | Payer: 59 | Attending: Orthopedic Surgery | Admitting: Orthopedic Surgery

## 2020-04-28 ENCOUNTER — Ambulatory Visit: Payer: 59

## 2020-04-28 ENCOUNTER — Encounter: Payer: Self-pay | Admitting: Orthopedic Surgery

## 2020-04-28 ENCOUNTER — Encounter
Admission: RE | Disposition: A | Discharge status: Home / Self Care | Payer: Self-pay | Source: Home / Self Care | Attending: Orthopedic Surgery

## 2020-04-28 DIAGNOSIS — J449 Chronic obstructive pulmonary disease, unspecified: Secondary | ICD-10-CM | POA: Insufficient documentation

## 2020-04-28 DIAGNOSIS — M1712 Unilateral primary osteoarthritis, left knee: Principal | ICD-10-CM | POA: Insufficient documentation

## 2020-04-28 DIAGNOSIS — E119 Type 2 diabetes mellitus without complications: Secondary | ICD-10-CM | POA: Diagnosis not present

## 2020-04-28 DIAGNOSIS — Z79899 Other long term (current) drug therapy: Secondary | ICD-10-CM | POA: Insufficient documentation

## 2020-04-28 DIAGNOSIS — Z23 Encounter for immunization: Secondary | ICD-10-CM | POA: Insufficient documentation

## 2020-04-28 DIAGNOSIS — Z419 Encounter for procedure for purposes other than remedying health state, unspecified: Secondary | ICD-10-CM

## 2020-04-28 DIAGNOSIS — M25562 Pain in left knee: Secondary | ICD-10-CM | POA: Diagnosis present

## 2020-04-28 DIAGNOSIS — Z96652 Presence of left artificial knee joint: Secondary | ICD-10-CM

## 2020-04-28 HISTORY — PX: TOTAL KNEE ARTHROPLASTY: SHX125

## 2020-04-28 LAB — ABO/RH: ABO/RH(D): O POS

## 2020-04-28 LAB — GLUCOSE, CAPILLARY
Glucose-Capillary: 98 mg/dL (ref 70–99)
Glucose-Capillary: 103 mg/dL — ABNORMAL HIGH (ref 70–99)
Glucose-Capillary: 185 mg/dL — ABNORMAL HIGH (ref 70–99)
Glucose-Capillary: 179 mg/dL — ABNORMAL HIGH (ref 70–99)

## 2020-04-28 SURGERY — ARTHROPLASTY, KNEE, TOTAL
Anesthesia: Spinal | Site: Knee | Laterality: Left

## 2020-04-28 MED ORDER — ALBUTEROL SULFATE (2.5 MG/3ML) 0.083% IN NEBU: 2.5000 mg | INHALATION_SOLUTION | Freq: Four times a day (QID) | RESPIRATORY_TRACT | Status: DC | PRN

## 2020-04-28 MED ORDER — LIDOCAINE HCL (PF) 1 % IJ SOLN
INTRAMUSCULAR | Status: AC
  Filled 2020-04-28: qty 5

## 2020-04-28 MED ORDER — MIDAZOLAM HCL 2 MG/2ML IJ SOLN
INTRAMUSCULAR | Status: AC
  Administered 2020-04-28: 1 mg via INTRAVENOUS
  Filled 2020-04-28: qty 2

## 2020-04-28 MED ORDER — ALBUTEROL SULFATE HFA 108 (90 BASE) MCG/ACT IN AERS: 1.0000 | INHALATION_SPRAY | Freq: Four times a day (QID) | RESPIRATORY_TRACT | Status: DC | PRN

## 2020-04-28 MED ORDER — POVIDONE-IODINE 10 % EX SWAB
2.0000 "application " | Freq: Once | CUTANEOUS | Status: AC
  Administered 2020-04-28: 2 via TOPICAL

## 2020-04-28 MED ORDER — LIDOCAINE HCL (PF) 2 % IJ SOLN
INTRAMUSCULAR | Status: AC
  Filled 2020-04-28: qty 5

## 2020-04-28 MED ORDER — BACITRACIN 50000 UNITS IM SOLR
INTRAMUSCULAR | Status: AC
  Filled 2020-04-28: qty 1

## 2020-04-28 MED ORDER — LISDEXAMFETAMINE DIMESYLATE 30 MG PO CAPS
30.0000 mg | ORAL_CAPSULE | ORAL | Status: DC
  Administered 2020-04-29: 30 mg via ORAL
  Filled 2020-04-28 (×2): qty 1

## 2020-04-28 MED ORDER — SODIUM CHLORIDE 0.9 % IV SOLN: INTRAVENOUS | Status: DC

## 2020-04-28 MED ORDER — BUPROPION HCL ER (SR) 150 MG PO TB12
150.0000 mg | ORAL_TABLET | Freq: Two times a day (BID) | ORAL | Status: DC
  Administered 2020-04-28 – 2020-04-29 (×2): 150 mg via ORAL
  Filled 2020-04-28 (×4): qty 1

## 2020-04-28 MED ORDER — ATENOLOL 25 MG PO TABS
50.0000 mg | ORAL_TABLET | Freq: Every day | ORAL | Status: DC
  Administered 2020-04-29: 50 mg via ORAL
  Filled 2020-04-28: qty 2

## 2020-04-28 MED ORDER — ONDANSETRON HCL 4 MG/2ML IJ SOLN
INTRAMUSCULAR | Status: AC
  Filled 2020-04-28: qty 2

## 2020-04-28 MED ORDER — TRANEXAMIC ACID-NACL 1000-0.7 MG/100ML-% IV SOLN
INTRAVENOUS | Status: AC
  Filled 2020-04-28: qty 100

## 2020-04-28 MED ORDER — HYDROCHLOROTHIAZIDE 25 MG PO TABS
25.0000 mg | ORAL_TABLET | Freq: Every day | ORAL | Status: DC
  Administered 2020-04-28 – 2020-04-29 (×2): 25 mg via ORAL
  Filled 2020-04-28 (×2): qty 1

## 2020-04-28 MED ORDER — MIDAZOLAM HCL 2 MG/2ML IJ SOLN
INTRAMUSCULAR | Status: DC | PRN
  Administered 2020-04-28: 2 mg via INTRAVENOUS

## 2020-04-28 MED ORDER — ONDANSETRON HCL 4 MG/2ML IJ SOLN: 4.0000 mg | Freq: Four times a day (QID) | INTRAMUSCULAR | Status: DC | PRN

## 2020-04-28 MED ORDER — MORPHINE SULFATE (PF) 2 MG/ML IV SOLN
0.5000 mg | INTRAVENOUS | Status: DC | PRN
  Administered 2020-04-28: 1 mg via INTRAVENOUS
  Administered 2020-04-28: 0.5 mg via INTRAVENOUS
  Filled 2020-04-28 (×2): qty 1

## 2020-04-28 MED ORDER — MAGNESIUM CITRATE PO SOLN
1.0000 | Freq: Once | ORAL | Status: DC | PRN
  Filled 2020-04-28: qty 296

## 2020-04-28 MED ORDER — PREGABALIN 75 MG PO CAPS
300.0000 mg | ORAL_CAPSULE | Freq: Every day | ORAL | Status: DC
  Administered 2020-04-28: 300 mg via ORAL
  Filled 2020-04-28 (×2): qty 4

## 2020-04-28 MED ORDER — BISACODYL 10 MG RE SUPP: 10.0000 mg | Freq: Every day | RECTAL | Status: DC | PRN

## 2020-04-28 MED ORDER — ONDANSETRON HCL 4 MG/2ML IJ SOLN: 4.0000 mg | Freq: Once | INTRAMUSCULAR | Status: DC | PRN

## 2020-04-28 MED ORDER — TIOTROPIUM BROMIDE MONOHYDRATE 18 MCG IN CAPS
18.0000 ug | ORAL_CAPSULE | Freq: Every day | RESPIRATORY_TRACT | Status: DC | PRN
  Filled 2020-04-28: qty 5

## 2020-04-28 MED ORDER — BUPIVACAINE HCL (PF) 0.5 % IJ SOLN
INTRAMUSCULAR | Status: DC | PRN
  Administered 2020-04-28: 3 mL via INTRATHECAL

## 2020-04-28 MED ORDER — PNEUMOCOCCAL VAC POLYVALENT 25 MCG/0.5ML IJ INJ
0.5000 mL | INJECTION | INTRAMUSCULAR | Status: AC
  Administered 2020-04-29: 0.5 mL via INTRAMUSCULAR
  Filled 2020-04-28: qty 0.5

## 2020-04-28 MED ORDER — DEXAMETHASONE SODIUM PHOSPHATE 10 MG/ML IJ SOLN
INTRAMUSCULAR | Status: AC
  Filled 2020-04-28: qty 1

## 2020-04-28 MED ORDER — ORAL CARE MOUTH RINSE: 15.0000 mL | Freq: Once | OROMUCOSAL | Status: AC

## 2020-04-28 MED ORDER — PROPOFOL 500 MG/50ML IV EMUL
INTRAVENOUS | Status: AC
  Filled 2020-04-28: qty 50

## 2020-04-28 MED ORDER — ACETAMINOPHEN 10 MG/ML IV SOLN
INTRAVENOUS | Status: AC
  Filled 2020-04-28: qty 100

## 2020-04-28 MED ORDER — FENTANYL CITRATE (PF) 100 MCG/2ML IJ SOLN
INTRAMUSCULAR | Status: AC
  Administered 2020-04-28: 50 ug via INTRAVENOUS
  Filled 2020-04-28: qty 2

## 2020-04-28 MED ORDER — AMLODIPINE BESYLATE 10 MG PO TABS
10.0000 mg | ORAL_TABLET | ORAL | Status: DC
  Administered 2020-04-29: 10 mg via ORAL
  Filled 2020-04-28: qty 1

## 2020-04-28 MED ORDER — SODIUM CHLORIDE 0.9 % IV SOLN
INTRAVENOUS | Status: DC | PRN
  Administered 2020-04-28: 90 mL

## 2020-04-28 MED ORDER — DULOXETINE HCL 30 MG PO CPEP
30.0000 mg | ORAL_CAPSULE | Freq: Every day | ORAL | Status: DC
  Administered 2020-04-28: 30 mg via ORAL
  Filled 2020-04-28 (×2): qty 1

## 2020-04-28 MED ORDER — SODIUM CHLORIDE 0.9 % IR SOLN
Status: DC | PRN
  Administered 2020-04-28: 250 mL

## 2020-04-28 MED ORDER — PROPOFOL 10 MG/ML IV BOLUS
INTRAVENOUS | Status: AC
  Filled 2020-04-28: qty 20

## 2020-04-28 MED ORDER — DEXAMETHASONE SODIUM PHOSPHATE 4 MG/ML IJ SOLN
INTRAMUSCULAR | Status: DC | PRN
  Administered 2020-04-28: 8 mg via INTRAVENOUS

## 2020-04-28 MED ORDER — ATENOLOL 25 MG PO TABS
100.0000 mg | ORAL_TABLET | Freq: Every day | ORAL | Status: DC
  Administered 2020-04-28: 100 mg via ORAL
  Filled 2020-04-28: qty 4

## 2020-04-28 MED ORDER — ACETAMINOPHEN 325 MG PO TABS: 325.0000 mg | ORAL_TABLET | Freq: Four times a day (QID) | ORAL | Status: DC | PRN

## 2020-04-28 MED ORDER — BUPIVACAINE-EPINEPHRINE (PF) 0.5% -1:200000 IJ SOLN
INTRAMUSCULAR | Status: AC
  Filled 2020-04-28: qty 30

## 2020-04-28 MED ORDER — INSULIN ASPART 100 UNIT/ML ~~LOC~~ SOLN
0.0000 [IU] | Freq: Three times a day (TID) | SUBCUTANEOUS | Status: DC
  Administered 2020-04-28: 3 [IU] via SUBCUTANEOUS
  Filled 2020-04-28: qty 1

## 2020-04-28 MED ORDER — PHENYLEPHRINE HCL (PRESSORS) 10 MG/ML IV SOLN
INTRAVENOUS | Status: DC | PRN
  Administered 2020-04-28 (×2): 50 ug via INTRAVENOUS

## 2020-04-28 MED ORDER — TRANEXAMIC ACID-NACL 1000-0.7 MG/100ML-% IV SOLN
1000.0000 mg | INTRAVENOUS | Status: AC
  Administered 2020-04-28: 1000 mg via INTRAVENOUS

## 2020-04-28 MED ORDER — PRAVASTATIN SODIUM 20 MG PO TABS
40.0000 mg | ORAL_TABLET | Freq: Every day | ORAL | Status: DC
  Administered 2020-04-28: 40 mg via ORAL
  Filled 2020-04-28: qty 2

## 2020-04-28 MED ORDER — PHENOL 1.4 % MT LIQD
1.0000 | OROMUCOSAL | Status: DC | PRN
  Filled 2020-04-28: qty 177

## 2020-04-28 MED ORDER — CEFAZOLIN SODIUM-DEXTROSE 2-4 GM/100ML-% IV SOLN
2.0000 g | Freq: Four times a day (QID) | INTRAVENOUS | Status: AC
  Administered 2020-04-28 (×2): 2 g via INTRAVENOUS
  Filled 2020-04-28 (×2): qty 100

## 2020-04-28 MED ORDER — MOMETASONE FURO-FORMOTEROL FUM 200-5 MCG/ACT IN AERO
2.0000 | INHALATION_SPRAY | Freq: Two times a day (BID) | RESPIRATORY_TRACT | Status: DC
  Administered 2020-04-28: 2 via RESPIRATORY_TRACT
  Filled 2020-04-28: qty 8.8

## 2020-04-28 MED ORDER — METOCLOPRAMIDE HCL 10 MG PO TABS: 5.0000 mg | ORAL_TABLET | Freq: Three times a day (TID) | ORAL | Status: DC | PRN

## 2020-04-28 MED ORDER — INSULIN ASPART 100 UNIT/ML ~~LOC~~ SOLN
0.0000 [IU] | Freq: Every day | SUBCUTANEOUS | Status: DC
  Filled 2020-04-28: qty 1

## 2020-04-28 MED ORDER — DOCUSATE SODIUM 100 MG PO CAPS
100.0000 mg | ORAL_CAPSULE | Freq: Two times a day (BID) | ORAL | Status: DC
  Administered 2020-04-28 – 2020-04-29 (×2): 100 mg via ORAL
  Filled 2020-04-28 (×2): qty 1

## 2020-04-28 MED ORDER — CEFAZOLIN SODIUM-DEXTROSE 2-4 GM/100ML-% IV SOLN
2.0000 g | INTRAVENOUS | Status: AC
  Administered 2020-04-28: 2 g via INTRAVENOUS

## 2020-04-28 MED ORDER — CHLORHEXIDINE GLUCONATE 0.12 % MT SOLN: 15.0000 mL | Freq: Once | OROMUCOSAL | Status: AC

## 2020-04-28 MED ORDER — METOCLOPRAMIDE HCL 5 MG/ML IJ SOLN: 5.0000 mg | Freq: Three times a day (TID) | INTRAMUSCULAR | Status: DC | PRN

## 2020-04-28 MED ORDER — SODIUM CHLORIDE 0.9 % IV SOLN
INTRAVENOUS | Status: DC | PRN
  Administered 2020-04-28: 1000 mL

## 2020-04-28 MED ORDER — SODIUM CHLORIDE 0.9 % IV SOLN: INTRAVENOUS | Status: DC | PRN

## 2020-04-28 MED ORDER — BUPIVACAINE LIPOSOME 1.3 % IJ SUSP
INTRAMUSCULAR | Status: AC
  Filled 2020-04-28: qty 20

## 2020-04-28 MED ORDER — LACTATED RINGERS IV SOLN: INTRAVENOUS | Status: DC

## 2020-04-28 MED ORDER — BUPIVACAINE HCL (PF) 0.5 % IJ SOLN: INTRAMUSCULAR | Status: DC | PRN

## 2020-04-28 MED ORDER — FENTANYL CITRATE (PF) 100 MCG/2ML IJ SOLN: 25.0000 ug | INTRAMUSCULAR | Status: DC | PRN

## 2020-04-28 MED ORDER — DULOXETINE HCL 60 MG PO CPEP
60.0000 mg | ORAL_CAPSULE | Freq: Every day | ORAL | Status: DC
  Administered 2020-04-28: 60 mg via ORAL
  Filled 2020-04-28 (×2): qty 1

## 2020-04-28 MED ORDER — LIDOCAINE HCL (PF) 1 % IJ SOLN
INTRAMUSCULAR | Status: DC | PRN
  Administered 2020-04-28: 1 mL via SUBCUTANEOUS

## 2020-04-28 MED ORDER — ACETAMINOPHEN 500 MG PO TABS
500.0000 mg | ORAL_TABLET | Freq: Four times a day (QID) | ORAL | Status: DC
  Administered 2020-04-28 – 2020-04-29 (×3): 500 mg via ORAL
  Filled 2020-04-28 (×3): qty 1

## 2020-04-28 MED ORDER — ALUM & MAG HYDROXIDE-SIMETH 200-200-20 MG/5ML PO SUSP: 30.0000 mL | ORAL | Status: DC | PRN

## 2020-04-28 MED ORDER — ONDANSETRON HCL 4 MG/2ML IJ SOLN
INTRAMUSCULAR | Status: DC | PRN
  Administered 2020-04-28: 4 mg via INTRAVENOUS

## 2020-04-28 MED ORDER — ACETAMINOPHEN 10 MG/ML IV SOLN
INTRAVENOUS | Status: DC | PRN
  Administered 2020-04-28: 1000 mg via INTRAVENOUS

## 2020-04-28 MED ORDER — PANTOPRAZOLE SODIUM 40 MG PO TBEC
40.0000 mg | DELAYED_RELEASE_TABLET | Freq: Every day | ORAL | Status: DC
  Administered 2020-04-29: 40 mg via ORAL
  Filled 2020-04-28: qty 1

## 2020-04-28 MED ORDER — MENTHOL 3 MG MT LOZG
1.0000 | LOZENGE | OROMUCOSAL | Status: DC | PRN
  Filled 2020-04-28: qty 9

## 2020-04-28 MED ORDER — BUPIVACAINE HCL (PF) 0.5 % IJ SOLN
INTRAMUSCULAR | Status: DC | PRN
Start: 2020-04-28 — End: 2020-04-28
  Administered 2020-04-28: 20 mL via PERINEURAL
  Administered 2020-04-28: 10 mL via PERINEURAL

## 2020-04-28 MED ORDER — CEFAZOLIN SODIUM-DEXTROSE 2-4 GM/100ML-% IV SOLN
INTRAVENOUS | Status: AC
  Filled 2020-04-28: qty 100

## 2020-04-28 MED ORDER — HYDROCODONE-ACETAMINOPHEN 7.5-325 MG PO TABS
1.0000 | ORAL_TABLET | ORAL | Status: DC | PRN
  Administered 2020-04-28 – 2020-04-29 (×2): 2 via ORAL
  Administered 2020-04-29: 1 via ORAL
  Filled 2020-04-28 (×2): qty 2
  Filled 2020-04-28: qty 1

## 2020-04-28 MED ORDER — FENTANYL CITRATE (PF) 100 MCG/2ML IJ SOLN: 50.0000 ug | INTRAMUSCULAR | Status: DC | PRN

## 2020-04-28 MED ORDER — BUPIVACAINE HCL (PF) 0.5 % IJ SOLN
INTRAMUSCULAR | Status: AC
  Filled 2020-04-28: qty 10

## 2020-04-28 MED ORDER — LIDOCAINE HCL (CARDIAC) PF 100 MG/5ML IV SOSY
PREFILLED_SYRINGE | INTRAVENOUS | Status: DC | PRN
  Administered 2020-04-28: 100 mg via INTRAVENOUS

## 2020-04-28 MED ORDER — ONDANSETRON HCL 4 MG PO TABS: 4.0000 mg | ORAL_TABLET | Freq: Four times a day (QID) | ORAL | Status: DC | PRN

## 2020-04-28 MED ORDER — CHLORHEXIDINE GLUCONATE 0.12 % MT SOLN
OROMUCOSAL | Status: AC
  Administered 2020-04-28: 15 mL via OROMUCOSAL
  Filled 2020-04-28: qty 15

## 2020-04-28 MED ORDER — KETAMINE HCL 50 MG/ML IJ SOLN
INTRAMUSCULAR | Status: DC | PRN
Start: 2020-04-28 — End: 2020-04-28
  Administered 2020-04-28: 50 mg via INTRAMUSCULAR

## 2020-04-28 MED ORDER — PROPOFOL 10 MG/ML IV BOLUS
INTRAVENOUS | Status: DC | PRN
  Administered 2020-04-28: 30 mg via INTRAVENOUS

## 2020-04-28 MED ORDER — LIDOCAINE HCL 1 % IJ SOLN
INTRAMUSCULAR | Status: DC | PRN
  Administered 2020-04-28: 6 mL

## 2020-04-28 MED ORDER — HYDROCODONE-ACETAMINOPHEN 5-325 MG PO TABS
1.0000 | ORAL_TABLET | ORAL | Status: DC | PRN
  Administered 2020-04-28: 1 via ORAL
  Filled 2020-04-28: qty 1

## 2020-04-28 MED ORDER — PROPOFOL 500 MG/50ML IV EMUL
INTRAVENOUS | Status: DC | PRN
  Administered 2020-04-28: 50 ug/kg/min via INTRAVENOUS

## 2020-04-28 MED ORDER — ROPIVACAINE HCL 5 MG/ML IJ SOLN
INTRAMUSCULAR | Status: AC
  Filled 2020-04-28: qty 30

## 2020-04-28 MED ORDER — DIPHENHYDRAMINE HCL 12.5 MG/5ML PO ELIX: 12.5000 mg | ORAL_SOLUTION | ORAL | Status: DC | PRN

## 2020-04-28 MED ORDER — MIDAZOLAM HCL 2 MG/2ML IJ SOLN: 1.0000 mg | INTRAMUSCULAR | Status: DC | PRN

## 2020-04-28 MED ORDER — RIVAROXABAN 10 MG PO TABS
10.0000 mg | ORAL_TABLET | Freq: Every day | ORAL | Status: DC
  Administered 2020-04-29: 10 mg via ORAL
  Filled 2020-04-28: qty 1

## 2020-04-28 MED ORDER — MIDAZOLAM HCL 2 MG/2ML IJ SOLN
INTRAMUSCULAR | Status: AC
  Filled 2020-04-28: qty 2

## 2020-04-28 MED ORDER — LISINOPRIL 20 MG PO TABS
20.0000 mg | ORAL_TABLET | Freq: Every day | ORAL | Status: DC
  Administered 2020-04-28 – 2020-04-29 (×2): 20 mg via ORAL
  Filled 2020-04-28 (×3): qty 1

## 2020-04-28 SURGICAL SUPPLY — 60 items
BASEPLATE TIBIAL LT SZ3 (Knees) ×3 IMPLANT
BLADE SAW 18WX90L 1.27 THK (BLADE) ×3 IMPLANT
BLADE SAW SAG 25X90X1.19 (BLADE) ×3 IMPLANT
BOWL CEMENT MIX W/ADAPTER (MISCELLANEOUS) ×3 IMPLANT
BRUSH SCRUB EZ  4% CHG (MISCELLANEOUS) ×4
BRUSH SCRUB EZ 4% CHG (MISCELLANEOUS) ×2 IMPLANT
CANISTER SUCT 1200ML W/VALVE (MISCELLANEOUS) ×3 IMPLANT
CANISTER SUCT 3000ML PPV (MISCELLANEOUS) ×6 IMPLANT
CEMENT BONE 1-PACK (Cement) ×6 IMPLANT
CHLORAPREP W/TINT 26 (MISCELLANEOUS) ×6 IMPLANT
COMP FEMORAL SZ 4 LEFT NARROW (Orthopedic Implant) ×3 IMPLANT
COMP PATELLA GENESIS 29 OVAL (Orthopedic Implant) ×3 IMPLANT
COMPONENT FEMRL SZ4 LT NARROW (Orthopedic Implant) ×1 IMPLANT
COMPONENT PTLLA GENS 29 OVAL (Orthopedic Implant) ×1 IMPLANT
COOLER POLAR GLACIER W/PUMP (MISCELLANEOUS) ×3 IMPLANT
COVER WAND RF STERILE (DRAPES) ×3 IMPLANT
CUFF TOURN SGL QUICK 30 (TOURNIQUET CUFF) ×2
CUFF TRNQT CYL 30X4X21-28X (TOURNIQUET CUFF) ×1 IMPLANT
DRAPE 3/4 80X56 (DRAPES) ×6 IMPLANT
DRAPE INCISE IOBAN 66X60 STRL (DRAPES) ×3 IMPLANT
ELECT REM PT RETURN 9FT ADLT (ELECTROSURGICAL) ×3
ELECTRODE REM PT RTRN 9FT ADLT (ELECTROSURGICAL) ×1 IMPLANT
GAUZE SPONGE 4X4 12PLY STRL (GAUZE/BANDAGES/DRESSINGS) ×3 IMPLANT
GAUZE XEROFORM 1X8 LF (GAUZE/BANDAGES/DRESSINGS) ×3 IMPLANT
GLOVE BIO SURGEON STRL SZ8 (GLOVE) ×3 IMPLANT
GLOVE BIOGEL PI IND STRL 8.5 (GLOVE) ×1 IMPLANT
GLOVE BIOGEL PI INDICATOR 8.5 (GLOVE) ×2
GLOVE INDICATOR 8.0 STRL GRN (GLOVE) ×3 IMPLANT
GLOVE SURG ORTHO 8.0 STRL STRW (GLOVE) ×9 IMPLANT
GOWN STRL REUS W/ TWL LRG LVL3 (GOWN DISPOSABLE) ×1 IMPLANT
GOWN STRL REUS W/ TWL XL LVL3 (GOWN DISPOSABLE) ×1 IMPLANT
GOWN STRL REUS W/TWL LRG LVL3 (GOWN DISPOSABLE) ×2
GOWN STRL REUS W/TWL XL LVL3 (GOWN DISPOSABLE) ×2
HOOD PEEL AWAY FLYTE STAYCOOL (MISCELLANEOUS) ×9 IMPLANT
INSERT ARTI HI FLEX 3-4 SZ10 (Insert) ×3 IMPLANT
IV NS 1000ML (IV SOLUTION) ×2
IV NS 1000ML BAXH (IV SOLUTION) ×1 IMPLANT
KIT TURNOVER KIT A (KITS) ×3 IMPLANT
MAT ABSORB  FLUID 56X50 GRAY (MISCELLANEOUS) ×2
MAT ABSORB FLUID 56X50 GRAY (MISCELLANEOUS) ×1 IMPLANT
NDL SAFETY ECLIPSE 18X1.5 (NEEDLE) ×1 IMPLANT
NEEDLE HYPO 18GX1.5 SHARP (NEEDLE) ×2
NEEDLE SPNL 20GX3.5 QUINCKE YW (NEEDLE) ×3 IMPLANT
NS IRRIG 1000ML POUR BTL (IV SOLUTION) ×3 IMPLANT
PACK TOTAL KNEE (MISCELLANEOUS) ×3 IMPLANT
PAD DE MAYO PRESSURE PROTECT (MISCELLANEOUS) ×3 IMPLANT
PAD WRAPON POLAR KNEE (MISCELLANEOUS) ×1 IMPLANT
PULSAVAC PLUS IRRIG FAN TIP (DISPOSABLE) ×3
STAPLER SKIN PROX 35W (STAPLE) ×3 IMPLANT
SUCTION FRAZIER HANDLE 10FR (MISCELLANEOUS) ×2
SUCTION TUBE FRAZIER 10FR DISP (MISCELLANEOUS) ×1 IMPLANT
SUT DVC 2 QUILL PDO  T11 36X36 (SUTURE) ×2
SUT DVC 2 QUILL PDO T11 36X36 (SUTURE) ×1 IMPLANT
SUT VIC AB 2-0 CT1 18 (SUTURE) ×3 IMPLANT
SUT VIC AB 2-0 CT1 27 (SUTURE)
SUT VIC AB 2-0 CT1 TAPERPNT 27 (SUTURE) IMPLANT
SUT VIC AB PLUS 45CM 1-MO-4 (SUTURE) ×3 IMPLANT
SYR 30ML LL (SYRINGE) ×9 IMPLANT
TIP FAN IRRIG PULSAVAC PLUS (DISPOSABLE) ×1 IMPLANT
WRAPON POLAR PAD KNEE (MISCELLANEOUS) ×3

## 2020-04-28 NOTE — Op Note (Signed)
DATE OF SURGERY:  04/28/2020 TIME: 12:50 PM  PATIENT NAME:  [name]   AGE: 64 y.o.    PRE-OPERATIVE DIAGNOSIS:  M17.12 Unilateral primary osteoarthritis, left knee  POST-OPERATIVE DIAGNOSIS:  Same  PROCEDURE:  Procedure(s): TOTAL KNEE ARTHROPLASTY, LEFT  SURGEON:  Lovell Sheehan, MD   ASSISTANT:  Carlynn Spry, PA-C  OPERATIVE IMPLANTS: Tamala [employer]hew, Cruciate Retaining Oxinium Femoral component size  4 narrow, Fixed Bearing Tray size 3, Patella polyethylene 3-peg oval button size 29 mm, with a 10 mm HighFlex insert.   PREOPERATIVE INDICATIONS:  Ermina Oberman is an 64 y.o. female who has a diagnosis of M17.12 Unilateral primary osteoarthritis, left knee and elected for a left total knee arthroplasty after failing nonoperative treatment, including activity modification, pain medication, physical therapy and injections who has significant impairment of their activities of daily living.  Radiographs have demonstrated tricompartmental osteoarthritis joint space narrowing, osteophytes, subchondral sclerosis and cyst formation.  The risks, benefits, and alternatives were discussed at length including but not limited to the risks of infection, bleeding, nerve or blood vessel injury, knee stiffness, fracture, dislocation, loosening or failure of the hardware and the need for further surgery. Medical risks include but not limited to DVT and pulmonary embolism, myocardial infarction, stroke, pneumonia, respiratory failure and death. I discussed these risks with the patient in my office prior to the date of surgery. They understood these risks and were willing to proceed.  OPERATIVE FINDINGS AND UNIQUE ASPECTS OF THE CASE:  All three compartments with advanced and severe degenerative changes, large osteophytes and an abundance of synovial fluid. Significant deformity was also noted. A decision was made to proceed with total knee arthroplasty.   OPERATIVE DESCRIPTION:  The patient  was brought to the operative room and placed in a supine position after undergoing placement of a general anesthetic. IV antibiotics were given. Patient received tranexamic acid. The lower extremity was prepped and draped in the usual sterile fashion.  A time out was performed to verify the patient's name, date of birth, medical record number, correct site of surgery and correct procedure to be performed. The timeout was also used to confirm the patient received antibiotics and that appropriate instruments, implants and radiographs studies were available in the room.  The leg was elevated and exsanguinated with an Esmarch and the tourniquet was inflated to 275 mmHg.  A midline incision was made over the left knee.. A medial parapatellar arthrotomy was then made and the patella subluxed laterally and the knee was brought into 90 of flexion. Hoffa's fat pad along with the anterior cruciate ligament was resected and the medial joint line was exposed.  Attention was then turned to preparation of the patella. The thickness of the patella was measured with a caliper, the diameter measured with the patella templates.  The patella resection was then made with an oscillating saw using the patella cutting guide.  The 29 mm button fit appropriately.  3 peg holes for the patella component were then drilled.  The extramedullary tibial cutting guide was then placed using the anterior tibial crest and second ray of the foot as a reference.  The tibial cutting guide was adjusted to allow for appropriate posterior slope.  The tibial cutting block was pinned into position. The slotted stylus was used to measure the proximal tibial resection of 9 mm off the high lateral side. Care was taken during the tibial resection to protect the medial and collateral ligaments.  The resected tibial bone was  removed.  The distal femur was resected using the Visionaire cutting guide.  Care was taken to protect the collateral ligaments  during distal femoral resection.  The distal femoral resection was performed with an oscillating saw. The femoral cutting guide was then removed. Extension gap was measured with a 10 mm spacer block and alignment and extension was confirmed using a long alignment rod. The femur was sized to be a 4 narrow. Rotation of the referencing guide was checked with the epicondylar axis and Whitesides line. Then the 4-in-1 cutting jig was then applied to the distal femur. A stylus was used to confirm that the anterior femur would not be notched.   Then the anterior, posterior and chamfer femoral cuts were then made with an oscillating saw.  The knee was distracted and all posterior osteophytes were removed.  The flexion gap was then measured with a flexion spacer block and long alignment rod and was found to be symmetric with the extension gap and perpendicular to mechanical axis of the tibia.  The proximal tibia plateau was then sized with trial trays. The best coverage was achieved with a size 3. This tibial tray was then pinned into position. The proximal tibia was then prepared with the keel punch.  After tibial preparation was completed, all trial components were inserted with polyethylene trials. The knee achieved full extension and flexed to 120 degrees. Ligament were stable to varus and valgus at full extension as well as 30, 60 and 90 degrees of flexion.   The trials were then placed. Knee was taken through a full range of motion and deemed to be stable with the trial components. All trial components were then removed.  The joint was copiously irrigated with pulse lavage.  The final total knee arthroplasty components were then cemented into place. The knee was held in extension while cement was allowed to cure.The knee was taken through a range of motion and the patella tracked well and the knee was again irrigated copiously.  The knee capsule was then injected with Exparel.  The medial arthrotomy was closed  with #1 Vicryl and #2 Quill. The subcutaneous tissue closed with  2-0 vicryl, and skin approximated with staples.  A dry sterile and compressive dressing was applied.  A Polar Care was applied to the operative knee.  The patient was awakened and brought to the PACU in stable and satisfactory condition.  All sharp, lap and instrument counts were correct at the conclusion the case. I spoke with the patient's family in the postop consultation room to let them know the case had been performed without complication and the patient was stable in recovery room.   Total tourniquet time was 46 minutes.

## 2020-04-28 NOTE — H&P (Signed)
The patient has been re-examined, and the chart reviewed, and there have been no interval changes to the documented history and physical.  Plan a left total knee today.  Anesthesia is consulted regarding a peripheral nerve block for post-operative pain.  The risks, benefits, and alternatives have been discussed at length, and the patient is willing to proceed.

## 2020-04-28 NOTE — Progress Notes (Signed)
Pt tolerated dinner meal, no n/v; IVF discontinued as ordered

## 2020-04-28 NOTE — Progress Notes (Addendum)
Report given to [place], 1A RN, who is aware of pt spinal and bladder scan measuring 251m urine.

## 2020-04-28 NOTE — Anesthesia Preprocedure Evaluation (Addendum)
Anesthesia Evaluation  Patient identified by MRN, date of birth, ID band Patient awake    Reviewed: Allergy & Precautions, H&P , NPO status , Patient's Chart, lab work & pertinent test results, reviewed documented beta blocker date and time   Airway Mallampati: III  TM Distance: <3 FB Neck ROM: full    Dental  (+) Poor Dentition, Chipped   Pulmonary shortness of breath and with exertion, sleep apnea and Continuous Positive Airway Pressure Ventilation , COPD,    Pulmonary exam normal        Cardiovascular Exercise Tolerance: Poor hypertension, On Medications negative cardio ROS Normal cardiovascular exam Rhythm:regular Rate:Normal     Neuro/Psych PSYCHIATRIC DISORDERS Anxiety Depression  Neuromuscular disease    GI/Hepatic GERD  Medicated,(+) Hepatitis -  Endo/Other  diabetes, Well Controlled, Type 2Morbid obesity  Renal/GU Renal disease  negative genitourinary   Musculoskeletal  (+) Arthritis , Osteoarthritis,    Abdominal   Peds  Hematology negative hematology ROS (+) anemia ,   Anesthesia Other Findings Past Medical History: 09/03/2014: Abnormal liver enzymes No date: Acid reflux No date: Allergy No date: Anxiety No date: Arthritis No date: Cataract No date: COPD (chronic obstructive pulmonary disease) (HCC) No date: Depression 08/27/2012: Elevation of level of transaminase or lactic acid  dehydrogenase (LDH) No date: Fatty liver No date: High blood pressure No date: High cholesterol No date: Sleep apnea Past Surgical History: No date: ABDOMINAL HYSTERECTOMY 06/15/2017: BARIATRIC SURGERY No date: CARDIAC CATHETERIZATION No date: FRACTURE SURGERY No date: KNEE SURGERY No date: TONSILLECTOMY No date: TUBAL LIGATION BMI    Body Mass Index:  44.80 kg/m     Reproductive/Obstetrics negative OB ROS                           Anesthesia Physical  Anesthesia Plan  ASA:  III  Anesthesia Plan: Spinal   Post-op Pain Management:  Regional for Post-op pain   Induction:   PONV Risk Score and Plan:   Airway Management Planned: Nasal Cannula  Additional Equipment:   Intra-op Plan:   Post-operative Plan:   Informed Consent: I have reviewed the patients History and Physical, chart, labs and discussed the procedure including the risks, benefits and alternatives for the proposed anesthesia with the patient or authorized representative who has indicated his/her understanding and acceptance.     Dental Advisory Given  Plan Discussed with: CRNA  Anesthesia Plan Comments: (Patient reports no bleeding problems and no anticoagulant use.  Plan for spinal with backup GA  Patient consented for risks of anesthesia including but not limited to:  - adverse reactions to medications - damage to eyes, teeth, lips or other oral mucosa - nerve damage due to positioning  - risk of bleeding, infection and or nerve damage from spinal that could lead to paralysis - risk of headache or failed spinal - damage to teeth, lips or other oral mucosa - sore throat or hoarseness - damage to heart, brain, nerves, lungs, other parts of body or loss of life  Patient voiced understanding. Consented patient for adductor canal block for postop pain control to be done under [place]... all risks discussed which include infection, nerve damage, bleeding , block not working among others.)      Anesthesia Quick Evaluation

## 2020-04-28 NOTE — Anesthesia Procedure Notes (Signed)
Anesthesia Regional Block: Adductor canal block   Pre-Anesthetic Checklist: ,, timeout performed, Correct Patient, Correct Site, Correct Laterality, Correct Procedure, Correct Position, site marked, Risks and benefits discussed,  Surgical consent,  Pre-op evaluation,  At surgeon's request and post-op pain management  Laterality: Lower and Left  Prep: chloraprep       Needles:  Injection technique: Single-shot  Needle Type: Echogenic Needle     Needle Length: 9cm  Needle Gauge: 21     Additional Needles:   Procedures:,,,, ultrasound used (permanent image in chart),,,,  Narrative:  Start time: 04/28/2020 10:23 AM End time: 04/28/2020 10:24 AM Injection made incrementally with aspirations every 5 mL.  Performed by: Personally  Anesthesiologist: Mariel Lukins, Precious Haws, MD  Additional Notes: Patient consented for risk and benefits of nerve block including but not limited to nerve damage, failed block, bleeding and infection.  Patient voiced understanding.  Functioning IV was confirmed and monitors were applied.  Timeout done prior to procedure and prior to any sedation being given to the patient.  Patient confirmed procedure site prior to any sedation given to the patient.  A 39m 22ga Stimuplex needle was used. Sterile prep,hand hygiene and sterile gloves were used.  Minimal sedation used for procedure.  No paresthesia endorsed by patient during the procedure.  Negative aspiration and negative test dose prior to incremental administration of local anesthetic. The patient tolerated the procedure well with no immediate complications.

## 2020-04-28 NOTE — Anesthesia Procedure Notes (Signed)
Spinal  Patient location during procedure: OR Start time: 04/28/2020 11:07 AM End time: 04/28/2020 11:12 AM Staffing Performed: resident/CRNA  Anesthesiologist: Piscitello, Precious Haws, MD Resident/CRNA: Eben Burow, CRNA Preanesthetic Checklist Completed: patient identified, IV checked, site marked, risks and benefits discussed, surgical consent, monitors and equipment checked and pre-op evaluation Spinal Block Patient position: sitting Prep: ChloraPrep and site prepped and draped Patient monitoring: heart rate, continuous pulse ox and blood pressure Approach: midline Location: L4-5 Injection technique: single-shot Needle Needle type: Pencan  Needle gauge: 24 G Needle length: 10 cm Assessment Sensory level: T6

## 2020-04-28 NOTE — Evaluation (Signed)
Physical Therapy Evaluation Patient Details Name: Angela Mullen MRN: [id number] DOB: 10-13-1955 Today's Date: 04/28/2020   History of Present Illness  Pt is a 64 yo female diagnosed with OA of the L knee and is s/p elective L TKA. PMH includes: HTN, SOB with exertion, COPD, depression, and DM.    Clinical Impression  Pt was pleasant and motivated to participate during the session.  Pt put forth very good effort throughout the session and overall performed very well with functional tasks especially considering POD#0 status.  Pt required no physical assistance with any mobility task and demonstrated good sitting and standing balance.  Pt able to bear weight through the LLE in standing and during limited ambulation with good stability and with no signs of L knee buckling.  Pt's SpO2 and HR were both WNL during the session with no adverse symptoms reported by pt other than min to mod L knee pain that did not seem to worsen with activity.  Pt will benefit from HHPT services upon discharge to safely address deficits listed in patient problem list for decreased caregiver assistance and eventual return to PLOF.      Follow Up Recommendations Home health PT;Supervision for mobility/OOB    Equipment Recommendations  Rolling walker with 5" wheels    Recommendations for Other Services       Precautions / Restrictions Precautions Precautions: Knee Restrictions Weight Bearing Restrictions: Yes LLE Weight Bearing: Weight bearing as tolerated      Mobility  Bed Mobility Overal bed mobility: Modified Independent             General bed mobility comments: Extra time and effort only  Transfers Overall transfer level: Needs assistance Equipment used: Rolling walker (2 wheeled) Transfers: Sit to/from Stand Sit to Stand: Min guard         General transfer comment: Mod verbal and tactile cues for sequencing with transfer training provided with pt and spouse from various height  surfaces  Ambulation/Gait Ambulation/Gait assistance: Min guard Gait Distance (Feet): 5 Feet Assistive device: Rolling walker (2 wheeled) Gait Pattern/deviations: Step-to pattern;Antalgic;Decreased step length - right;Decreased stance time - left Gait velocity: decreased   General Gait Details: Antalgic, step-to pattern but steady without LOB or L knee buckling  Stairs            Wheelchair Mobility    Modified Rankin (Stroke Patients Only)       Balance Overall balance assessment: Needs assistance Sitting-balance support: Bilateral upper extremity supported;Feet unsupported Sitting balance-Leahy Scale: Good     Standing balance support: Bilateral upper extremity supported;During functional activity Standing balance-Leahy Scale: Good                               Pertinent Vitals/Pain Pain Assessment: 0-10 Pain Score: 5  Pain Location: L knee Pain Descriptors / Indicators: Sore Pain Intervention(s): Premedicated before session;Monitored during session    Home Living Family/patient expects to be discharged to:: Private residence Living Arrangements: Spouse/significant other Available Help at Discharge: Family Type of Home: House Home Access: Stairs to enter Entrance Stairs-Rails: Psychiatric nurse of Steps: 1 step x 2 both without rails and 6 steps x 1 with wide bilateral rails Home Layout: One level Home Equipment: Cane - single point;Shower seat Additional Comments: Pt has elevated toilet with sink counter on one side and door frame on the other to pull up from.    Prior Function Level of Independence: Independent with  assistive device(s)         Comments: Mod Ind amb limited community distances with a SPC     Hand Dominance        Extremity/Trunk Assessment   Upper Extremity Assessment Upper Extremity Assessment: Overall WFL for tasks assessed    Lower Extremity Assessment Lower Extremity Assessment: LLE  deficits/detail LLE Deficits / Details: BLE ankle strength and AROM WNL; L hip flex >/= 3/5 LLE: Unable to fully assess due to pain LLE Sensation: WNL       Communication   Communication: No difficulties  Cognition Arousal/Alertness: Awake/alert Behavior During Therapy: WFL for tasks assessed/performed Overall Cognitive Status: Within Functional Limits for tasks assessed                                        General Comments      Exercises Total Joint Exercises Ankle Circles/Pumps: AROM;Both;5 reps;10 reps Quad Sets: AROM;Strengthening;Both;5 reps;10 reps Gluteal Sets: Strengthening;Both;5 reps;10 reps Straight Leg Raises: AROM;Both;5 reps Long Arc Quad: AROM;Strengthening;Left;5 reps;10 reps Knee Flexion: AROM;Strengthening;Left;5 reps;10 reps Goniometric ROM: L knee AROM: 4-86 deg Marching in Standing: AROM;Strengthening;Both;10 reps;Standing Other Exercises Other Exercises: HEP education for BLE APs, QS, and GS x 10 each every 1-2 hours daily Other Exercises: Positioning education with pt and spouse to promote L knee ext PROM and to prevent heel pressure   Assessment/Plan    PT Assessment Patient needs continued PT services  PT Problem List Decreased strength;Decreased range of motion;Decreased activity tolerance;Decreased balance;Decreased mobility;Decreased knowledge of use of DME;Pain       PT Treatment Interventions DME instruction;Gait training;Stair training;Functional mobility training;Therapeutic activities;Therapeutic exercise;Balance training;Patient/family education    PT Goals (Current goals can be found in the Care Plan section)  Acute Rehab PT Goals Patient Stated Goal: To walk with less pain PT Goal Formulation: With patient Time For Goal Achievement: 05/11/20 Potential to Achieve Goals: Good    Frequency BID   Barriers to discharge        Co-evaluation               AM-PAC PT "6 Clicks" Mobility  Outcome Measure Help  needed turning from your back to your side while in a flat bed without using bedrails?: A Little Help needed moving from lying on your back to sitting on the side of a flat bed without using bedrails?: A Little Help needed moving to and from a bed to a chair (including a wheelchair)?: A Little Help needed standing up from a chair using your arms (e.g., wheelchair or bedside chair)?: A Little Help needed to walk in hospital room?: A Lot Help needed climbing 3-5 steps with a railing? : A Lot 6 Click Score: 16    End of Session Equipment Utilized During Treatment: Gait belt Activity Tolerance: Patient tolerated treatment well Patient left: in chair;with call bell/phone within reach;with chair alarm set;with family/visitor present;with SCD's reapplied;Other (comment) (Polar care donned to L knee) Nurse Communication: Mobility status PT Visit Diagnosis: Other abnormalities of gait and mobility (R26.89);Muscle weakness (generalized) (M62.81);Pain Pain - Right/Left: Left Pain - part of body: Knee    Time: 5993-5701 PT Time Calculation (min) (ACUTE ONLY): 57 min   Charges:   PT Evaluation $PT Eval Moderate Complexity: 1 Mod PT Treatments $Therapeutic Exercise: 8-22 mins $Therapeutic Activity: 8-22 mins        D. Royetta Asal PT, DPT 04/28/20, 5:23 PM

## 2020-04-28 NOTE — Progress Notes (Signed)
Pt has voided x2 independently on BSC clear yellow odorless urine.

## 2020-04-28 NOTE — Transfer of Care (Signed)
Immediate Anesthesia Transfer of Care Note  Patient: Angela Mullen  Procedure(s) Performed: TOTAL KNEE ARTHROPLASTY (Left Knee)  Patient Location: PACU  Anesthesia Type:Spinal  Level of Consciousness: awake, alert , oriented and patient cooperative  Airway & Oxygen Therapy: Patient Spontanous Breathing and Patient connected to face mask oxygen  Post-op Assessment: Report given to RN and Post -op Vital signs reviewed and stable  Post vital signs: Reviewed and stable  Last Vitals:  Vitals Value Taken Time  BP 100/61 04/28/20 1254  Temp 36.7 C 04/28/20 1254  Pulse 61 04/28/20 1256  Resp 17 04/28/20 1256  SpO2 100 % 04/28/20 1256  Vitals shown include unvalidated device data.  Last Pain:  Vitals:   04/28/20 0912  TempSrc: Temporal  PainSc: 6          Complications: No complications documented.

## 2020-04-29 ENCOUNTER — Encounter: Payer: Self-pay | Admitting: Orthopedic Surgery

## 2020-04-29 LAB — BASIC METABOLIC PANEL
Anion gap: 8 (ref 5–15)
BUN: 19 mg/dL (ref 8–23)
CO2: 23 mmol/L (ref 22–32)
Calcium: 8.9 mg/dL (ref 8.9–10.3)
Chloride: 106 mmol/L (ref 98–111)
Creatinine, Ser: 1.41 mg/dL — ABNORMAL HIGH (ref 0.44–1.00)
GFR calc Af Amer: 46 mL/min — ABNORMAL LOW (ref >60)
GFR calc non Af Amer: 40 mL/min — ABNORMAL LOW (ref >60)
Glucose, Bld: 136 mg/dL — ABNORMAL HIGH (ref 70–99)
Potassium: 3.7 mmol/L (ref 3.5–5.1)
Sodium: 137 mmol/L (ref 135–145)

## 2020-04-29 LAB — CBC
HCT: 37.2 % (ref 36.0–46.0)
Hemoglobin: 12.3 g/dL (ref 12.0–15.0)
MCH: 30.1 pg (ref 26.0–34.0)
MCHC: 33.1 g/dL (ref 30.0–36.0)
MCV: 91 fL (ref 80.0–100.0)
Platelets: 158 10*3/uL (ref 150–400)
RBC: 4.09 MIL/uL (ref 3.87–5.11)
RDW: 13.9 % (ref 11.5–15.5)
WBC: 10.7 10*3/uL — ABNORMAL HIGH (ref 4.0–10.5)
nRBC: 0 % (ref 0.0–0.2)

## 2020-04-29 LAB — GLUCOSE, CAPILLARY
Glucose-Capillary: 114 mg/dL — ABNORMAL HIGH (ref 70–99)
Glucose-Capillary: 88 mg/dL (ref 70–99)

## 2020-04-29 MED ORDER — DOCUSATE SODIUM 100 MG PO CAPS: 100.0000 mg | ORAL_CAPSULE | Freq: Two times a day (BID) | ORAL | 0 refills | Status: DC

## 2020-04-29 MED ORDER — RIVAROXABAN 10 MG PO TABS: 10.0000 mg | ORAL_TABLET | Freq: Every day | ORAL | 0 refills | Status: DC

## 2020-04-29 MED ORDER — ONDANSETRON HCL 4 MG PO TABS: 4.0000 mg | ORAL_TABLET | Freq: Four times a day (QID) | ORAL | 0 refills | Status: DC | PRN

## 2020-04-29 MED ORDER — HYDROCODONE-ACETAMINOPHEN 5-325 MG PO TABS: 1.0000 | ORAL_TABLET | ORAL | 0 refills | Status: DC | PRN

## 2020-04-29 NOTE — Plan of Care (Signed)

## 2020-04-29 NOTE — Discharge Summary (Signed)
Physician Discharge Summary  Patient ID: Angela Mullen MRN: [id number] DOB/AGE: 63/05/1956 63 y.o.  Admit date: 04/28/2020 Discharge date: 04/29/2020  Admission Diagnoses:  M17.12 Unilateral primary osteoarthritis, left knee <principal problem not specified>  Discharge Diagnoses:  M17.12 Unilateral primary osteoarthritis, left knee Active Problems:   S/P TKR (total knee replacement) using cement, left   Past Medical History:  Diagnosis Date  . Abnormal liver enzymes 09/03/2014  . Acid reflux   . Allergy   . Anemia    H/O  . Anxiety   . Arthritis   . Cataract   . COPD (chronic obstructive pulmonary disease) ([place])   . Depression   . Diabetes mellitus without complication ([place])   . Dyspnea    WITH EXERTION DUE TO COPD  . Elevation of level of transaminase or lactic acid dehydrogenase (LDH) 08/27/2012  . Fatty liver   . Headache    H/O MIGRAINES  . High blood pressure   . High cholesterol   . Sleep apnea    BIPAP    Surgeries: Procedure(s): TOTAL KNEE ARTHROPLASTY on 04/28/2020   Consultants (if any):   Discharged Condition: Improved  Hospital Course: Angela Mullen is an 64 y.o. female who was admitted 04/28/2020 with a diagnosis of  M17.12 Unilateral primary osteoarthritis, left knee <principal problem not specified> and went to the operating room on 04/28/2020 and underwent the above named procedures.    She was given perioperative antibiotics:  Anti-infectives (From admission, onward)   Start     Dose/Rate Route Frequency Ordered Stop   04/28/20 1700  ceFAZolin (ANCEF) IVPB 2g/100 mL premix        2 g 200 mL/hr over 30 Minutes Intravenous Every 6 hours 04/28/20 1500 04/29/20 0011   04/28/20 1150  bacitracin 50,000 Units in sodium chloride 0.9 % 500 mL irrigation  Status:  Discontinued          As needed 04/28/20 1150 04/28/20 1249   04/28/20 1148  50,000 units bacitracin in 0.9% normal saline 250 mL irrigation  Status:  Discontinued          As needed 04/28/20  1148 04/28/20 1249   04/28/20 0859  ceFAZolin (ANCEF) 2-4 GM/100ML-% IVPB       Note to Pharmacy: Leonia Reader   : cabinet override      04/28/20 0859 04/28/20 1128   04/28/20 0845  ceFAZolin (ANCEF) IVPB 2g/100 mL premix        2 g 200 mL/hr over 30 Minutes Intravenous On call to O.R. 04/28/20 1937 04/28/20 1128    .  She was given sequential compression devices, early ambulation, and Xarelto for DVT prophylaxis.  She benefited maximally from the hospital stay and there were no complications.    Recent vital signs:  Vitals:   04/28/20 2324 04/29/20 0412  BP: 140/73 (!) 118/49  Pulse: 68 68  Resp: 18 18  Temp: 97.7 F (36.5 C) 98 F (36.7 C)  SpO2: 94% 94%    Recent laboratory studies:  Lab Results  Component Value Date   HGB 13.8 04/16/2020   HGB 12.9 03/01/2020   HGB 12.4 08/07/2018   Lab Results  Component Value Date   WBC 8.4 04/16/2020   PLT 214 04/16/2020   Lab Results  Component Value Date   INR 1.0 04/16/2020   Lab Results  Component Value Date   NA 140 04/08/2020   K 4.1 04/08/2020   CL 104 04/08/2020   CO2 23 04/08/2020   BUN  22 04/08/2020   CREATININE 1.54 (H) 04/08/2020   GLUCOSE 88 04/08/2020    Discharge Medications:   Allergies as of 04/29/2020      Reactions   Codeine Hives, Nausea Only   Ketorolac Other (See Comments)   Altered Mental Status-PT DENIES THIS ALLERGY   Prednisone Other (See Comments)   "wheezing"   Gabapentin Nausea Only, Other (See Comments)   'weird dreams' increased appetite hallucinations   Nsaids    DUE TO HAVING GASTRIC BYPASS SURGERY      Medication List    TAKE these medications   acetaminophen 500 MG tablet Commonly known as: TYLENOL Take 1,000 mg by mouth every 6 (six) hours as needed for moderate pain.   Advair HFA 115-21 MCG/ACT inhaler Generic drug: fluticasone-salmeterol INHALE 2 PUFFS BY MOUTH EVERY 12 HOURS What changed:   how much to take  how to take this  when to take this  reasons  to take this  additional instructions   albuterol (2.5 MG/3ML) 0.083% nebulizer solution Commonly known as: PROVENTIL Take 3 mLs (2.5 mg total) by nebulization every 6 (six) hours as needed for wheezing or shortness of breath.   albuterol 108 (90 Base) MCG/ACT inhaler Commonly known as: VENTOLIN HFA Inhale 1-2 puffs into the lungs every 6 (six) hours as needed for wheezing or shortness of breath.   amLODipine 10 MG tablet Commonly known as: NORVASC TAKE 1 TABLET (10 MG TOTAL) BY MOUTH ONCE DAILY. What changed:   how much to take  how to take this  when to take this   atenolol 50 MG tablet Commonly known as: TENORMIN Take 1 tablet (50 mg) in the morning & 2 tablets (100 mg) in the evening.   buPROPion 150 MG 12 hr tablet Commonly known as: WELLBUTRIN SR Take 150 mg by mouth 2 (two) times daily.   clotrimazole-betamethasone cream Commonly known as: LOTRISONE Apply 1 application topically 2 (two) times daily. What changed:   when to take this  reasons to take this   docusate sodium 100 MG capsule Commonly known as: COLACE Take 1 capsule (100 mg total) by mouth 2 (two) times daily.   DULoxetine 60 MG capsule Commonly known as: CYMBALTA Take 60 mg by mouth at bedtime.   DULoxetine 30 MG capsule Commonly known as: CYMBALTA Take 30 mg by mouth at bedtime.   hydrochlorothiazide 25 MG tablet Commonly known as: HYDRODIURIL Take 1 tablet (25 mg total) by mouth daily. What changed: when to take this   HYDROcodone-acetaminophen 5-325 MG tablet Commonly known as: NORCO/VICODIN Take 1 tablet by mouth every 4 (four) hours as needed for moderate pain (pain score 4-6).   lisinopril 20 MG tablet Commonly known as: ZESTRIL Take 1 tablet (20 mg total) by mouth daily. What changed: when to take this   lovastatin 40 MG tablet Commonly known as: MEVACOR Take 1 tablet (40 mg total) by mouth at bedtime.   methocarbamol 750 MG tablet Commonly known as: ROBAXIN Take 1  tablet (750 mg total) by mouth every 8 (eight) hours as needed.   omeprazole 40 MG capsule Commonly known as: PRILOSEC Take 1 capsule (40 mg total) by mouth daily. What changed: when to take this   ondansetron 4 MG tablet Commonly known as: ZOFRAN Take 1 tablet (4 mg total) by mouth every 6 (six) hours as needed for nausea.   Ozempic (0.25 or 0.5 MG/DOSE) 2 MG/1.5ML Sopn Generic drug: Semaglutide(0.25 or 0.5MG/DOS) INJECT 0.25 MG INTO THE SKIN ONCE A WEEK.  What changed: additional instructions   pregabalin 150 MG capsule Commonly known as: LYRICA TAKE 2 CAPSULES (300 MG TOTAL) BY MOUTH DAILY. What changed: when to take this   rivaroxaban 10 MG Tabs tablet Commonly known as: XARELTO Take 1 tablet (10 mg total) by mouth daily with breakfast.   Spiriva HandiHaler 18 MCG inhalation capsule Generic drug: tiotropium INHALE ONE PUFF BY MOUTH ONCE DAILY What changed:   how much to take  how to take this  when to take this  reasons to take this  additional instructions   traZODone 100 MG tablet Commonly known as: DESYREL Take 100 mg by mouth at bedtime.   Vyvanse 30 MG capsule Generic drug: lisdexamfetamine Take 30 mg by mouth every morning.            Durable Medical Equipment  (From admission, onward)         Start     Ordered   04/29/20 0643  For home use only DME 3 n 1  Once        04/29/20 5732   04/29/20 0643  For home use only DME Walker rolling  Once       Question Answer Comment  Walker: With 5 Inch Wheels   Patient needs a walker to treat with the following condition Osteoarthritis of left knee      04/29/20 2025          Diagnostic Studies: US Venous Img Lower Unilateral Left (DVT)  Result Date: 04/20/2020 CLINICAL DATA:  Left lower extremity swelling for the past day. Remote history of fall. Evaluate for DVT. EXAM: LEFT LOWER EXTREMITY VENOUS DOPPLER ULTRASOUND TECHNIQUE: Gray-scale sonography with graded compression, as well as color  Doppler and duplex ultrasound were performed to evaluate the lower extremity deep venous systems from the level of the common femoral vein and including the common femoral, femoral, profunda femoral, popliteal and calf veins including the posterior tibial, peroneal and gastrocnemius veins when visible. The superficial great saphenous vein was also interrogated. Spectral Doppler was utilized to evaluate flow at rest and with distal augmentation maneuvers in the common femoral, femoral and popliteal veins. COMPARISON:  None. FINDINGS: Contralateral Common Femoral Vein: Respiratory phasicity is normal and symmetric with the symptomatic side. No evidence of thrombus. Normal compressibility. Common Femoral Vein: No evidence of thrombus. Normal compressibility, respiratory phasicity and response to augmentation. Saphenofemoral Junction: No evidence of thrombus. Normal compressibility and flow on color Doppler imaging. Profunda Femoral Vein: No evidence of thrombus. Normal compressibility and flow on color Doppler imaging. Femoral Vein: No evidence of thrombus. Normal compressibility, respiratory phasicity and response to augmentation. Popliteal Vein: No evidence of thrombus. Normal compressibility, respiratory phasicity and response to augmentation. Calf Veins: No evidence of thrombus. Normal compressibility and flow on color Doppler imaging. Superficial Great Saphenous Vein: No evidence of thrombus. Normal compressibility. Venous Reflux:  None. Other Findings:  None. IMPRESSION: No evidence of DVT within the left lower extremity. Electronically Signed   By: Sandi Mariscal M.D.   On: 04/20/2020 15:32   DG Knee Left Port  Result Date: 04/28/2020 CLINICAL DATA:  Status post left knee replacement EXAM: PORTABLE LEFT KNEE - 1-2 VIEW COMPARISON:  None. FINDINGS: Left knee prosthesis is noted in satisfactory position. No acute soft tissue or bony abnormality is noted. IMPRESSION: Status post left knee replacement Electronically  Signed   By: Inez Catalina M.D.   On: 04/28/2020 13:36   [place] OR NERVE BLOCK-IMAGE ONLY [facility])  Result Date: 04/28/2020 There  is no interpretation for this exam.  This order is for images obtained during a surgical procedure.  Please See "Surgeries" Tab for more information regarding the procedure.    Disposition: Discharge disposition: 01-Home or Self Care      Follow up in 2 weeks.  Call Office to confirm appointment. [phone]      Signed: Carlynn Spry ,PA-C 04/29/2020, 6:43 AM

## 2020-04-29 NOTE — Discharge Instructions (Signed)
Continue weight bear as tolerated on the left lower extremity.    Elevate the left lower extremity whenever possible and continue the polar care while elevating the extremity. Patient may shower. No bath or submerging the wound.    Take Xarelto as directed for blood clot prevention.  Continue to work on knee range of motion exercises at home as instructed by physical therapy. Continue to use a walker for assistance with ambulation until cleared by physical therapy.  Call [phone] with any questions, such as fever > 101.5 degrees, drainage from the wound or shortness of breath.

## 2020-04-29 NOTE — Progress Notes (Addendum)
Physical Therapy Treatment Patient Details Name: Angela Mullen MRN: [id number] DOB: 02/04/1956 Today's Date: 04/29/2020    History of Present Illness Pt is a 64 yo female diagnosed with OA of the L knee and is s/p elective L TKA. PMH includes: HTN, SOB with exertion, COPD, depression, and DM.    PT Comments    Pt was pleasant and motivated to participate during the session and put forth very good effort throughout.  Pt made good progress towards goals this session.  Pt was steady with transfers with good eccentric and concentric control.  Pt ambulated with improved gait pattern and cadence progressing to step-through pattern during the session with good control and stability.  Pt demonstrated very good eccentric and concentric control ascending and descending both 1 step without rails using a RW and 4 steps with one rail.  Pt reported no increase in L knee pain with activity and no adverse symptoms with HR and SpO2 WNL throughout.  Pt will benefit from HHPT services upon discharge to safely address deficits listed in patient problem list for decreased caregiver assistance and eventual return to PLOF.      Follow Up Recommendations  Home health PT;Supervision for mobility/OOB     Equipment Recommendations  Rolling walker with 5" wheels    Recommendations for Other Services       Precautions / Restrictions Precautions Precautions: Knee Restrictions Weight Bearing Restrictions: Yes LLE Weight Bearing: Weight bearing as tolerated    Mobility  Bed Mobility               General bed mobility comments: NT, pt sitting up upon arrival  Transfers Overall transfer level: Needs assistance Equipment used: Rolling walker (2 wheeled) Transfers: Sit to/from Stand Sit to Stand: Supervision         General transfer comment: Mod verbal cues for sequencing during transfer training  Ambulation/Gait Ambulation/Gait assistance: Supervision Gait Distance (Feet): 150 Feet x 1, 75  Feet x 1 Assistive device: Rolling walker (2 wheeled) Gait Pattern/deviations: Step-to pattern;Antalgic;Decreased step length - right;Decreased stance time - left;Step-through pattern Gait velocity: decreased   General Gait Details: Step-to pattern initially that progressed to step-through pattern with minimally antalgic gait pattern on the LLE; min to mod verbal cues and practice for increased RLE step length and decreased WB through BUEs; 90 deg left turn training to prevent CKC twisting on the L knee   Stairs: Ascend/descend 1 step x 4 with a RW forwards descending and backwards ascending; Ascend/descend 4 steps x 2 with L rail forwards with step-to pattern.               Wheelchair Mobility    Modified Rankin (Stroke Patients Only)       Balance Overall balance assessment: Needs assistance Sitting-balance support: Feet unsupported;No upper extremity supported Sitting balance-Leahy Scale: Normal     Standing balance support: Bilateral upper extremity supported;During functional activity Standing balance-Leahy Scale: Good                              Cognition Arousal/Alertness: Awake/alert Behavior During Therapy: WFL for tasks assessed/performed Overall Cognitive Status: Within Functional Limits for tasks assessed                                        Exercises Total Joint Exercises Ankle Circles/Pumps: AROM;Both;10 reps Quad Sets:  AROM;Strengthening;Both;10 reps;Left Straight Leg Raises: AROM;10 reps;Left Long Arc Quad: AROM;Strengthening;Left;5 reps;10 reps Knee Flexion: AROM;Strengthening;Left;5 reps;10 reps Marching in Standing: AROM;Strengthening;Both;10 reps;Standing L knee AROM: 0-88 deg Other Exercises: HEP education/review for BLE APs, QS, and GS x 10 each every 1-2 hours daily Other Exercises: Car transfer sequencing verbal education and visual simulation using room chair Other Exercises: Extensive stair training with one  step without rails and 4 steps with one rail    General Comments        Pertinent Vitals/Pain Pain Assessment: 0-10 Pain Score: 4  Pain Location: L knee Pain Descriptors / Indicators: Sore Pain Intervention(s): Premedicated before session;Monitored during session    Home Living                      Prior Function            PT Goals (current goals can now be found in the care plan section) Progress towards PT goals: Progressing toward goals    Frequency    BID      PT Plan Current plan remains appropriate    Co-evaluation              AM-PAC PT "6 Clicks" Mobility   Outcome Measure  Help needed turning from your back to your side while in a flat bed without using bedrails?: A Little Help needed moving from lying on your back to sitting on the side of a flat bed without using bedrails?: A Little Help needed moving to and from a bed to a chair (including a wheelchair)?: A Little Help needed standing up from a chair using your arms (e.g., wheelchair or bedside chair)?: A Little Help needed to walk in hospital room?: A Little Help needed climbing 3-5 steps with a railing? : A Little 6 Click Score: 18    End of Session Equipment Utilized During Treatment: Gait belt Activity Tolerance: Patient tolerated treatment well;No increased pain Patient left: Other (comment) (Pt left with CNA at end of session for bathing/dressing/ADLs) Nurse Communication: Mobility status PT Visit Diagnosis: Other abnormalities of gait and mobility (R26.89);Muscle weakness (generalized) (M62.81);Pain Pain - Right/Left: Left Pain - part of body: Knee     Time: 0921-1000 PT Time Calculation (min) (ACUTE ONLY): 39 min  Charges:  $Gait Training: 23-37 mins $Therapeutic Exercise: 8-22 mins                    D. Scott Remmington Urieta PT, DPT 04/29/20, 10:52 AM

## 2020-04-29 NOTE — Anesthesia Postprocedure Evaluation (Signed)
Anesthesia Post Note  Patient: Banita Lehn  Procedure(s) Performed: TOTAL KNEE ARTHROPLASTY (Left Knee)  Patient location during evaluation: Nursing Unit Anesthesia Type: Spinal Level of consciousness: oriented and awake and alert Pain management: pain level controlled Vital Signs Assessment: post-procedure vital signs reviewed and stable Respiratory status: spontaneous breathing and respiratory function stable Cardiovascular status: blood pressure returned to baseline and stable Postop Assessment: no headache, no backache, no apparent nausea or vomiting and patient able to bend at knees Anesthetic complications: no   No complications documented.   Last Vitals:  Vitals:   04/28/20 2324 04/29/20 0412  BP: 140/73 (!) 118/49  Pulse: 68 68  Resp: 18 18  Temp: 36.5 C 36.7 C  SpO2: 94% 94%    Last Pain:  Vitals:   04/29/20 0412  TempSrc: Oral  PainSc:                  Caryl Asp

## 2020-04-29 NOTE — TOC Progression Note (Signed)
Transition of Care [facility]) - Progression Note    Patient Details  Name: Angela Mullen MRN: [id number] Date of Birth: 04/23/1956  Transition of Care [facility]) CM/SW [place], RN Phone Number: 04/29/2020, 11:14 AM  Clinical Narrative:   Met with the patient and her husband in the room to discuss DC plan and needs They have an appointment next Wed for Outpatient PT, She has transportation and can afford her medication, she is up to date with her PCP, She needs a RW and a 3 in 1, I notified Zack and explained that she has a DC in place.     Expected Discharge Plan: OP Rehab Barriers to Discharge: Barriers Resolved  Expected Discharge Plan and Services Expected Discharge Plan: OP Rehab   Discharge Planning Services: CM Consult   Living arrangements for the past 2 months: Single Family Home Expected Discharge Date: 04/29/20               DME Arranged: Berta Minor rolling DME Agency: [employer] Date DME Agency Contacted: 04/29/20 Time DME Agency Contacted: [phone] Representative spoke with at DME Agency: [employer] Arranged: NA           Social Determinants of Health ([place]) Interventions    Readmission Risk Interventions No flowsheet data found.

## 2020-04-29 NOTE — Progress Notes (Signed)
  Subjective:  Patient reports pain as mild to moderate.    Objective:   VITALS:   Vitals:   04/28/20 1854 04/28/20 2003 04/28/20 2324 04/29/20 0412  BP: 127/71 (!) 142/78 140/73 (!) 118/49  Pulse: 71 70 68 68  Resp: 18 20 18 18   Temp:  97.7 F (36.5 C) 97.7 F (36.5 C) 98 F (36.7 C)  TempSrc:  Oral Oral Oral  SpO2: 92% 98% 94% 94%  Weight:      Height:        PHYSICAL EXAM:  Neurologically intact ABD soft Neurovascular intact Sensation intact distally Intact pulses distally Dorsiflexion/Plantar flexion intact Incision: scant drainage No cellulitis present Compartment soft dressing changed  LABS  Results for orders placed or performed during the hospital encounter of 04/28/20 (from the past 24 hour(s))  Glucose, capillary     Status: None   Collection Time: 04/28/20  9:14 AM  Result Value Ref Range   Glucose-Capillary 98 70 - 99 mg/dL  ABO/Rh     Status: None   Collection Time: 04/28/20  9:18 AM  Result Value Ref Range   ABO/RH(D)      O POS Performed at [facility], [address] [place], [place] [address]   Glucose, capillary     Status: Abnormal   Collection Time: 04/28/20  1:03 PM  Result Value Ref Range   Glucose-Capillary 103 (H) 70 - 99 mg/dL  Glucose, capillary     Status: Abnormal   Collection Time: 04/28/20  5:08 PM  Result Value Ref Range   Glucose-Capillary 185 (H) 70 - 99 mg/dL  Glucose, capillary     Status: Abnormal   Collection Time: 04/28/20  9:19 PM  Result Value Ref Range   Glucose-Capillary 179 (H) 70 - 99 mg/dL    DG Knee Left Port  Result Date: 04/28/2020 CLINICAL DATA:  Status post left knee replacement EXAM: PORTABLE LEFT KNEE - 1-2 VIEW COMPARISON:  None. FINDINGS: Left knee prosthesis is noted in satisfactory position. No acute soft tissue or bony abnormality is noted. IMPRESSION: Status post left knee replacement Electronically Signed   By: Inez Catalina M.D.   On: 04/28/2020 13:36   [place] OR NERVE BLOCK-IMAGE ONLY  [facility])  Result Date: 04/28/2020 There is no interpretation for this exam.  This order is for images obtained during a surgical procedure.  Please See "Surgeries" Tab for more information regarding the procedure.    Assessment/Plan: 1 Day Post-Op   Active Problems:   S/P TKR (total knee replacement) using cement, left   Advance diet Up with therapy  Discharge home today after PT goals met Follow up in 2 weeks.  Call Office to confirm appointment. [place]   Carlynn Spry , PA-C 04/29/2020, 6:36 AM

## 2020-05-10 ENCOUNTER — Encounter: Payer: Self-pay | Admitting: Family Medicine

## 2020-05-19 ENCOUNTER — Other Ambulatory Visit: Payer: Self-pay | Admitting: Family Medicine

## 2020-05-19 NOTE — Telephone Encounter (Signed)
Requested medication (s) are due for refill today: no  Requested medication (s) are on the active medication list: yes  Last refill:  04/23/2020  Future visit scheduled: yes  Notes to clinic:  this refill cannot be delegated    Requested Prescriptions  Pending Prescriptions Disp Refills   methocarbamol (ROBAXIN) 750 MG tablet [Pharmacy Med Name: METHOCARBAMOL 750 MG TABLET] 90 tablet 0    Sig: Take 1 tablet (750 mg total) by mouth every 8 (eight) hours as needed.      Not Delegated - Analgesics:  Muscle Relaxants Failed - 05/19/2020 11:09 AM      Failed - This refill cannot be delegated      Passed - Valid encounter within last 6 months    Recent Outpatient Visits           1 month ago Essential (primary) hypertension   [facility] [place], [place]   2 months ago [place], [place], [place]   7 months ago Encounter for screening for malignant neoplasm of breast, unspecified screening modality   [facility] [place], [place]   9 months ago DM (diabetes mellitus), type 2 with complications [facility])   [facility] [place] Roof [place], [place]   10 months ago DM (diabetes mellitus), type 2 with complications [facility])   [facility] [place], [place]       Future Appointments             In 1 week Jonathon Bellows, MD [place]   In 4 months [place], Barbaraann Faster, NP [employer], [facility]

## 2020-05-26 ENCOUNTER — Ambulatory Visit: Payer: 59 | Admitting: Gastroenterology

## 2020-07-07 ENCOUNTER — Other Ambulatory Visit: Payer: Self-pay | Admitting: Nurse Practitioner

## 2020-07-07 ENCOUNTER — Other Ambulatory Visit: Payer: Self-pay | Admitting: Family Medicine

## 2020-07-07 ENCOUNTER — Telehealth: Payer: Self-pay | Admitting: Family Medicine

## 2020-07-07 MED ORDER — PREGABALIN 150 MG PO CAPS: 300.0000 mg | ORAL_CAPSULE | Freq: Every day | ORAL | 0 refills | Status: DC

## 2020-07-07 NOTE — Telephone Encounter (Signed)
Requested medication (s) are due for refill today:yes  Requested medication (s) are on the active medication list:yes  Last refill:  02/10/20  #180 1 refill  Future visit scheduled: yes  Notes to clinic:  Not delegated    Requested Prescriptions  Pending Prescriptions Disp Refills   pregabalin (LYRICA) 150 MG capsule [Pharmacy Med Name: PREGABALIN 150 MG CAPSULE] 180 capsule 1    Sig: TAKE 2 [place]      Not Delegated - Neurology:  Anticonvulsants - Controlled Failed - 07/07/2020  2:16 PM      Failed - This refill cannot be delegated      Passed - Valid encounter within last 12 months    Recent Outpatient Visits           3 months ago Essential (primary) hypertension   [facility] [place], [place]   4 months ago [place], [place], [place]   9 months ago Encounter for screening for malignant neoplasm of breast, unspecified screening modality   [facility] Roof [place], [place]   11 months ago DM (diabetes mellitus), type 2 with complications [facility])   [facility] [place], [place]   1 year ago DM (diabetes mellitus), type 2 with complications [facility])   [place], [place], PA-C       Future Appointments             In 3 months [place], Barbaraann Faster, NP [employer], [facility]

## 2020-07-07 NOTE — Telephone Encounter (Signed)
Sent in 6 pills to pharmacy request.

## 2020-07-07 NOTE — Telephone Encounter (Signed)
Routing to provider to advise. Updated pharmacy in chart.

## 2020-07-07 NOTE — Telephone Encounter (Signed)
Patient requesting 5 pills of pregabalin (LYRICA) 150 MG capsule due to her leaving medication at home, [place]. Patient would like a follow up call when done.    [facility] [address], [place], [place] [id number] [phone]

## 2020-07-07 NOTE — Telephone Encounter (Signed)
Called and LVM letting patient know that this was sent in for her.

## 2020-07-07 NOTE — Telephone Encounter (Signed)
Patient is requesting short term Rx of pregabalin 150 mg [place]- she forgot Rx at home.

## 2020-07-07 NOTE — Telephone Encounter (Signed)
Pt stated she has not heard back from anyone so she was calling for an update on her request for short term Rx

## 2020-07-14 ENCOUNTER — Ambulatory Visit: Payer: 59 | Admitting: Pulmonary Disease

## 2020-07-15 ENCOUNTER — Ambulatory Visit: Payer: 59 | Admitting: Family

## 2020-07-20 ENCOUNTER — Ambulatory Visit: Payer: 59 | Admitting: Adult Health

## 2020-07-21 ENCOUNTER — Ambulatory Visit: Payer: 59 | Admitting: Family

## 2020-07-21 ENCOUNTER — Ambulatory Visit: Payer: 59 | Admitting: Pulmonary Disease

## 2020-07-21 ENCOUNTER — Encounter: Payer: Self-pay | Admitting: Family

## 2020-07-21 ENCOUNTER — Other Ambulatory Visit: Payer: Self-pay

## 2020-07-21 VITALS — BP 129/84 | HR 64 | Ht 64.0 in | Wt 250.0 lb

## 2020-07-21 DIAGNOSIS — E782 Mixed hyperlipidemia: Secondary | ICD-10-CM

## 2020-07-21 DIAGNOSIS — G4733 Obstructive sleep apnea (adult) (pediatric): Secondary | ICD-10-CM | POA: Diagnosis not present

## 2020-07-21 DIAGNOSIS — K219 Gastro-esophageal reflux disease without esophagitis: Secondary | ICD-10-CM

## 2020-07-21 DIAGNOSIS — I951 Orthostatic hypotension: Secondary | ICD-10-CM | POA: Diagnosis not present

## 2020-07-21 DIAGNOSIS — I350 Nonrheumatic aortic (valve) stenosis: Secondary | ICD-10-CM | POA: Diagnosis not present

## 2020-07-21 DIAGNOSIS — I10 Essential (primary) hypertension: Secondary | ICD-10-CM | POA: Diagnosis not present

## 2020-07-21 NOTE — Patient Instructions (Addendum)
Medication Instructions:  Your physician has recommended you make the following change in your medication:   STOP Hydrochlorothiazide  *If you need a refill on your cardiac medications before your next appointment, please call your pharmacy*  Lab Work: None ordered today.  Testing/Procedures: Your EKG today was stable compared to previous. It showed normal sinus rhythm.  Your physician has requested that you have an echocardiogram. Echocardiography is a painless test that uses sound waves to create images of your heart. It provides your doctor with information about the size and shape of your heart and how well your heart's chambers and valves are working. This procedure takes approximately one hour. There are no restrictions for this procedure.  Follow-Up: At [facility], you and your health needs are our priority.  As part of our continuing mission to provide you with exceptional heart care, we have created designated Provider Care Teams.  These Care Teams include your primary Cardiologist (physician) and Advanced Practice Providers (APPs -  Physician Assistants and Nurse Practitioners) who all work together to provide you with the care you need, when you need it.  We recommend signing up for the patient portal called "MyChart".  Sign up information is provided on this After Visit Summary.  MyChart is used to connect with patients for Virtual Visits (Telemedicine).  Patients are able to view lab/test results, encounter notes, upcoming appointments, etc.  Non-urgent messages can be sent to your provider as well.   To learn more about what you can do with MyChart, go to [email or web address].    Your next appointment:   2-3 month(s)  The format for your next appointment:   In Person  Provider:   You may see Ida Rogue, MD or one of the following Advanced Practice Providers on your designated Care Team:    Murray Hodgkins, NP  Christell Faith, PA-C  Marrianne Mood,  PA-C  Cadence Kathlen Mody, [place]  Laurann Montana, NP   Other Instructions   Orthostatic Hypotension Blood pressure is a measurement of how strongly, or weakly, your blood is pressing against the walls of your arteries. Orthostatic hypotension is a sudden drop in blood pressure that happens when you quickly change positions, such as when you get up from sitting or lying down. Arteries are blood vessels that carry blood from your heart throughout your body. When blood pressure is too low, you may not get enough blood to your brain or to the rest of your organs. This can cause weakness, light-headedness, rapid heartbeat, and fainting. This can last for just a few seconds or for up to a few minutes. Orthostatic hypotension is usually not a serious problem. However, if it happens frequently or gets worse, it may be a sign of something more serious. What are the causes? This condition may be caused by:  Sudden changes in posture, such as standing up quickly after you have been sitting or lying down.  Blood loss.  Loss of body fluids (dehydration).  Heart problems.  Hormone (endocrine) problems.  Pregnancy.  Severe infection.  Lack of certain nutrients.  Severe allergic reactions (anaphylaxis).  Certain medicines, such as blood pressure medicine or medicines that make the body lose excess fluids (diuretics). Sometimes, this condition can be caused by not taking medicine as directed, such as taking too much of a certain medicine. What increases the risk? The following factors may make you more likely to develop this condition:  Age. Risk increases as you get older.  Conditions that affect the heart or the  central nervous system.  Taking certain medicines, such as blood pressure medicine or diuretics.  Being pregnant. What are the signs or symptoms? Symptoms of this condition may include:  Weakness.  Light-headedness.  Dizziness.  Blurred vision.  Fatigue.  Rapid  heartbeat.  Fainting, in severe cases. How is this diagnosed? This condition is diagnosed based on:  Your medical history.  Your symptoms.  Your blood pressure measurement. Your health care provider will check your blood pressure when you are: ? Lying down. ? Sitting. ? Standing. A blood pressure reading is recorded as two numbers, such as "120 over 80" (or 120/80). The first ("top") number is called the systolic pressure. It is a measure of the pressure in your arteries as your heart beats. The second ("bottom") number is called the diastolic pressure. It is a measure of the pressure in your arteries when your heart relaxes between beats. Blood pressure is measured in a unit called mm Hg. Healthy blood pressure for most adults is 120/80. If your blood pressure is below 90/60, you may be diagnosed with hypotension. Other information or tests that may be used to diagnose orthostatic hypotension include:  Your other vital signs, such as your heart rate and temperature.  Blood tests.  Tilt table test. For this test, you will be safely secured to a table that moves you from a lying position to an upright position. Your heart rhythm and blood pressure will be monitored during the test. How is this treated? This condition may be treated by:  Changing your diet. This may involve eating more salt (sodium) or drinking more water.  Taking medicines to raise your blood pressure.  Changing the dosage of certain medicines you are taking that might be lowering your blood pressure.  Wearing compression stockings. These stockings help to prevent blood clots and reduce swelling in your legs. In some cases, you may need to go to the hospital for:  Fluid replacement. This means you will receive fluids through an IV.  Blood replacement. This means you will receive donated blood through an IV (transfusion).  Treating an infection or heart problems, if this applies.  Monitoring. You may need to be  monitored while medicines that you are taking wear off. Follow these instructions at home: Eating and drinking   Drink enough fluid to keep your urine pale yellow.  Eat a healthy diet, and follow instructions from your health care provider about eating or drinking restrictions. A healthy diet includes: ? Fresh fruits and vegetables. ? Whole grains. ? Lean meats. ? Low-fat dairy products.  Eat extra salt only as directed. Do not add extra salt to your diet unless your health care provider told you to do that.  Eat frequent, small meals.  Avoid standing up suddenly after eating. Medicines  Take over-the-counter and prescription medicines only as told by your health care provider. ? Follow instructions from your health care provider about changing the dosage of your current medicines, if this applies. ? Do not stop or adjust any of your medicines on your own. General instructions   Wear compression stockings as told by your health care provider.  Get up slowly from lying down or sitting positions. This gives your blood pressure a chance to adjust.  Avoid hot showers and excessive heat as directed by your health care provider.  Return to your normal activities as told by your health care provider. Ask your health care provider what activities are safe for you.  Do not use any products  that contain nicotine or tobacco, such as cigarettes, e-cigarettes, and chewing tobacco. If you need help quitting, ask your health care provider.  Keep all follow-up visits as told by your health care provider. This is important. Contact a health care provider if you:  Vomit.  Have diarrhea.  Have a fever for more than 2-3 days.  Feel more thirsty than usual.  Feel weak and tired. Get help right away if you:  Have chest pain.  Have a fast or irregular heartbeat.  Develop numbness in any part of your body.  Cannot move your arms or your legs.  Have trouble speaking.  Become sweaty  or feel light-headed.  Faint.  Feel short of breath.  Have trouble staying awake.  Feel confused. Summary  Orthostatic hypotension is a sudden drop in blood pressure that happens when you quickly change positions.  Orthostatic hypotension is usually not a serious problem.  It is diagnosed by having your blood pressure taken lying down, sitting, and then standing.  It may be treated by changing your diet or adjusting your medicines. This information is not intended to replace advice given to you by your health care provider. Make sure you discuss any questions you have with your health care provider. Document Revised: 03/07/2018 Document Reviewed: 03/07/2018 Elsevier Patient Education  [place].

## 2020-07-21 NOTE — Progress Notes (Signed)
Office Visit    Patient Name: Angela Mullen Date of Encounter: 07/21/2020  Primary Care Provider:  Volney American, PA-C Primary Cardiologist:  Ida Rogue, MD Electrophysiologist:  None   Chief Complaint    Angela Mullen is a 64 y.o. female with a hx of HTN, HLD, DM2, obesity s/p gastric bypass, CKDIII, OSA, GERD,  COPD presents today for lightheadedness.  Past Medical History    Past Medical History:  Diagnosis Date  . Abnormal liver enzymes 09/03/2014  . Acid reflux   . Allergy   . Anemia    H/O  . Anxiety   . Arthritis   . Cataract   . COPD (chronic obstructive pulmonary disease) ([place])   . Depression   . Diabetes mellitus without complication ([place])   . Dyspnea    WITH EXERTION DUE TO COPD  . Elevation of level of transaminase or lactic acid dehydrogenase (LDH) 08/27/2012  . Fatty liver   . Headache    H/O MIGRAINES  . High blood pressure   . High cholesterol   . Sleep apnea    BIPAP   Past Surgical History:  Procedure Laterality Date  . ABDOMINAL HYSTERECTOMY    . BARIATRIC SURGERY  06/15/2017  . CARDIAC CATHETERIZATION    . COLONOSCOPY WITH PROPOFOL N/A 10/10/2018   Procedure: COLONOSCOPY WITH PROPOFOL;  Surgeon: Jonathon Bellows, MD;  Location[facility] END[facility]COPY;  Service: Gastroenterology;  Laterality: N/A;  . FRACTURE SURGERY  1990   RIGHT LEG   . KNEE SURGERY    . REPLACEMENT TOTAL KNEE Left   . TONSILLECTOMY    . TOTAL KNEE ARTHROPLASTY Left 04/28/2020   Procedure: TOTAL KNEE ARTHROPLASTY;  Surgeon: Lovell Sheehan, MD;  Location: [facility] ORS;  Service: Orthopedics;  Laterality: Left;  . TUBAL LIGATION      Allergies  Allergies  Allergen Reactions  . Codeine Hives and Nausea Only  . Ketorolac Other (See Comments)    Altered Mental Status-PT DENIES THIS ALLERGY  . Prednisone Other (See Comments)    "wheezing"  . Gabapentin Nausea Only and Other (See Comments)    'weird dreams' increased appetite hallucinations  . Nsaids     DUE TO  HAVING GASTRIC BYPASS SURGERY   History of Present Illness    Angela Mullen is a 65 y.o. female with a hx of f HTN, HLD, DM2, obesity s/p gastric bypass, CKDIII, OSA, GERD,  COPD. She was last seen 04/13/20.  Per previous documentation she had cardiac catheterization October 2015 revealing normal coronary anatomy and normal LV function.  This was performed at [facility] and is unfortunately unavailable in [place].  She had echocardiogram 03/2017 with EF 55%, mild LVH, mild aortic stenosis with mean gradient of 13 mmHg.  She was seen in clinic 04/13/20 for preoperative clearance for knee surgery. Her oldest stepson had a stroke a week prior she was understandably stressed. She noted some intermittent palpitations which were attributed to stress. Clearance for knee surgery was provided which was performed 04/28/20.   Presents today for low BP and lightheadedness. Her weight is down 11 pounds from her last clinic visit. She attributes this to working with PT and walking more after knee surgery. 111/66 with her psychiatrist this month. Not checking BP at home but has a new cuff at home and is planning to start monitoring at home.   Reports no shortness of breath nor dyspnea on exertion. Reports no chest pain, pressure, or tightness. No edema, orthopnea, PND. Reports  no palpitations.   She endorses both lightheadedness and dizziness. Her dizziness occurred a few weeks ago while at the beach and was a spinning sensation even while laying down. Discussed following up with primary care provider.  Notes lightheadedness with position changes. Discussed likely orthostatic hypotension due to low BP.   EKGs/Labs/Other Studies Reviewed:   The following studies were reviewed today:  Echocardiogram 04/18/2017 via care everywhere EF 55%   NORMAL LEFT VENTRICULAR SYSTOLIC FUNCTION WITH MILD LVH    NORMAL LA PRESSURES WITH NORMAL DIASTOLIC FUNCTION    NORMAL RIGHT VENTRICULAR SYSTOLIC FUNCTION    VALVULAR  REGURGITATION: MILD AR, TRIVIAL PR, TRIVIAL TR    VALVULAR STENOSIS: MILD AS (Mean gradient 82m Hg)   MILD AS.    NO PRIOR STUDY FOR COMPARISON   EKG:  EKG is  ordered today.  The ekg ordered today demonstrates sinus rhythm 64 bpm with no acute ST/T wave changes.  Recent Labs: 04/08/2020: ALT 20 04/29/2020: BUN 19; Creatinine, Ser 1.41; Hemoglobin 12.3; Platelets 158; Potassium 3.7; Sodium 137  Recent Lipid Panel    Component Value Date/Time   CHOL 145 04/08/2020 0955   TRIG 108 04/08/2020 0955   HDL 53 04/08/2020 0955   CHOLHDL 3.9 06/04/2018 0948   LDLCALC 72 04/08/2020 0955   Home Medications   Current Meds  Medication Sig  . acetaminophen (TYLENOL) 500 MG tablet Take 1,000 mg by mouth every 6 (six) hours as needed for moderate pain.   .Marland Kitchenalbuterol (PROVENTIL) (2.5 MG/3ML) 0.083% nebulizer solution Take 3 mLs (2.5 mg total) by nebulization every 6 (six) hours as needed for wheezing or shortness of breath.  .Marland Kitchenalbuterol (VENTOLIN HFA) 108 (90 Base) MCG/ACT inhaler Inhale 1-2 puffs into the lungs every 6 (six) hours as needed for wheezing or shortness of breath.  .Marland KitchenamLODipine (NORVASC) 10 MG tablet Take 10 mg by mouth daily.  .Marland Kitchenatenolol (TENORMIN) 50 MG tablet Take 1 tablet (50 mg) in the morning & 2 tablets (100 mg) in the evening.  .Marland KitchenbuPROPion (WELLBUTRIN SR) 150 MG 12 hr tablet Take 150 mg by mouth 2 (two) times daily.   . clotrimazole-betamethasone (LOTRISONE) cream Apply 1 application topically 2 (two) times daily as needed.  . DULoxetine (CYMBALTA) 30 MG capsule Take 30 mg by mouth at bedtime.   . DULoxetine (CYMBALTA) 60 MG capsule Take 60 mg by mouth at bedtime.   . fluticasone-salmeterol (ADVAIR HFA) 115-21 MCG/ACT inhaler Inhale 2 puffs into the lungs 2 (two) times daily as needed.  . hydrochlorothiazide (HYDRODIURIL) 25 MG tablet Take 25 mg by mouth daily.  .Marland Kitchenlisinopril (ZESTRIL) 20 MG tablet Take 20 mg by mouth daily.  .Marland Kitchenlovastatin (MEVACOR) 40 MG tablet Take 1 tablet (40  mg total) by mouth at bedtime.  . methocarbamol (ROBAXIN) 750 MG tablet TAKE 1 TABLET (750 MG TOTAL) BY MOUTH EVERY 8 (EIGHT) HOURS AS NEEDED.  .Marland Kitchenomeprazole (PRILOSEC) 40 MG capsule Take 40 mg by mouth daily.  . ondansetron (ZOFRAN) 4 MG tablet Take 1 tablet (4 mg total) by mouth every 6 (six) hours as needed for nausea.  .Marland KitchenOZEMPIC, 0.25 OR 0.5 MG/DOSE, 2 MG/1.5ML SOPN INJECT 0.25 MG INTO THE SKIN ONCE A WEEK.  . pregabalin (LYRICA) 150 MG capsule Take 2 capsules (300 mg total) by mouth daily.  .Marland Kitchentiotropium (SPIRIVA) 18 MCG inhalation capsule Place 18 mcg into inhaler and inhale daily as needed.  . traZODone (DESYREL) 100 MG tablet Take 100 mg by mouth at bedtime as  needed for sleep.  Marland Kitchen VYVANSE 30 MG capsule Take 30 mg by mouth every morning.     Review of Systems    All other systems reviewed and are otherwise negative except as noted above.  Physical Exam    VS:  BP 129/84 (BP Location: Left Arm, Patient Position: Sitting, Cuff Size: Large)   Pulse 64   Ht 5' 4"  (1.626 m)   Wt 250 lb (113.4 kg)   SpO2 97%   BMI 42.91 kg/m  , BMI Body mass index is 42.91 kg/m. GEN: Well nourished, overweight, well developed, in no acute distress. HEENT: normal. Neck: Supple, no JVD, carotid bruits, or masses. Cardiac: RRR, no murmurs, rubs, or gallops. No clubbing, cyanosis, edema.  Radials/DP/PT 2+ and equal bilaterally.  Respiratory:  Respirations regular and unlabored, clear to auscultation bilaterally. GI: Soft, nontender, nondistended, BS + x 4. MS: No deformity or atrophy. Skin: Warm and dry, no rash. Neuro:  Strength and sensation are intact. Psych: Normal affect.  Assessment & Plan    1. HTN / Orthostatic hypotension - Now with orthostatic hypotension likely precipitated by weight loss. Stop HCTZ. Continue Amlodipine, Atenolol, HCTZ, Lisinopril. Encouraged to make position changes slowly, wear compression stockings.   2. HLD - Lipid panel 04/08/20 with LDL 72. Continue Lovastatin 23m  daily.   3. Mild aortic stenosis by echo 2018 with mean gradient of 30 mmHg - Plan to update echocardiogram due to lightheadedness to rule out worsening aortic stenosis. No new dyspnea nor chest pain. Continue optimal BP and volume control.  4. OSA - Reports compliance with with CPAP.   5. GERD - Continue Omeprazole. Discussed to avoid spicy, fried, acidic foods.   6. CKD III - Careful titration of diuretics and antihypertensive. Continue to follow with PCP. Continue Lisinopril for renal protection.   Disposition: Follow up in 3 month(s) with Dr. GRockey Situor APP   CLoel Dubonnet NP 07/21/2020, 9:27 AM

## 2020-08-02 ENCOUNTER — Other Ambulatory Visit: Payer: Self-pay | Admitting: Family Medicine

## 2020-08-02 NOTE — Telephone Encounter (Signed)
Patient last seen in July and has appointment in [place]. Previous RL patient.

## 2020-08-02 NOTE — Telephone Encounter (Signed)
Requested medication (s) are due for refill today: yes  Requested medication (s) are on the active medication list: yes  Last refill: 05/20/20  Future visit scheduled: yes  Notes to clinic:  not delegated   Requested Prescriptions  Pending Prescriptions Disp Refills   methocarbamol (ROBAXIN) 750 MG tablet [Pharmacy Med Name: METHOCARBAMOL 750 MG TABLET] 90 tablet 0    Sig: TAKE 1 TABLET (750 MG TOTAL) BY MOUTH EVERY 8 (EIGHT) HOURS AS NEEDED.      Not Delegated - Analgesics:  Muscle Relaxants Failed - 08/02/2020 10:30 AM      Failed - This refill cannot be delegated      Passed - Valid encounter within last 6 months    Recent Outpatient Visits           3 months ago Essential (primary) hypertension   [place], [place], [place]   5 months ago [place], [place], [place]   10 months ago Encounter for screening for malignant neoplasm of breast, unspecified screening modality   [place], Lilia Argue, [place]   1 year ago DM (diabetes mellitus), type 2 with complications [facility])   [facility] Volney American, [place]   1 year ago DM (diabetes mellitus), type 2 with complications [facility])   [facility] Volney American, [place]       Future Appointments             In 3 weeks Flora Lipps, MD [place]   In 2 months [place], Barbaraann Faster, NP [employer], [place]   In 2 months [place], Kathlene November, MD [facility], [place]

## 2020-08-09 ENCOUNTER — Other Ambulatory Visit: Payer: Self-pay | Admitting: Family Medicine

## 2020-08-09 ENCOUNTER — Other Ambulatory Visit: Payer: 59

## 2020-08-09 NOTE — Telephone Encounter (Signed)
Previous RL patient. Last seen 04/08/20 and has appointment 10/14/20.

## 2020-08-09 NOTE — Telephone Encounter (Signed)
Requested medication (s) are due for refill today: yes  Requested medication (s) are on the active medication list: yes  Last refill:  07/07/20  Future visit scheduled: yes  Notes to clinic:  med not delegated to NT to RF   Requested Prescriptions  Pending Prescriptions Disp Refills   pregabalin (LYRICA) 150 MG capsule [Pharmacy Med Name: PREGABALIN 150 MG CAPSULE] 180 capsule     Sig: TAKE 2 CAPSULES BY MOUTH EVERY DAY      Not Delegated - Neurology:  Anticonvulsants - Controlled Failed - 08/09/2020  2:52 PM      Failed - This refill cannot be delegated      Passed - Valid encounter within last 12 months    Recent Outpatient Visits           4 months ago Essential (primary) hypertension   [facility] Volney American, [place]   5 months ago [place], [place], [place]   10 months ago Encounter for screening for malignant neoplasm of breast, unspecified screening modality   [place], Lilia Argue, [place]   1 year ago DM (diabetes mellitus), type 2 with complications [facility])   [facility] Volney American, [place]   1 year ago DM (diabetes mellitus), type 2 with complications [facility])   [facility] Volney American, [place]       Future Appointments             In 2 weeks Flora Lipps, MD [place]   In 2 months [place], Barbaraann Faster, NP [employer], [place]   In 2 months [place], Kathlene November, MD [facility], [place]

## 2020-08-09 NOTE — Telephone Encounter (Signed)
This is a duplicate request. Pregabalin request was routed to [facility] today 08/09/20 at 2:55 pm

## 2020-08-09 NOTE — Telephone Encounter (Signed)
Medication: pregabalin (LYRICA) 150 MG capsule [[id number]] requesting 90 day supply  Has the patient contacted their pharmacy? YES (Agent: If no, request that the patient contact the pharmacy for the refill.) (Agent: If yes, when and what did the pharmacy advise?)  Preferred Pharmacy (with phone number or street name[id number] [id number]: [phone]: [phone]: Not open 24 hours    Agent: Please be advised that RX refills may take up to 3 business days. We ask that you follow-up with your pharmacy.

## 2020-08-26 ENCOUNTER — Ambulatory Visit: Payer: 59 | Admitting: Internal Medicine

## 2020-09-08 ENCOUNTER — Other Ambulatory Visit: Payer: 59

## 2020-09-21 ENCOUNTER — Other Ambulatory Visit: Payer: Self-pay

## 2020-09-21 DIAGNOSIS — R002 Palpitations: Secondary | ICD-10-CM

## 2020-09-21 DIAGNOSIS — I10 Essential (primary) hypertension: Secondary | ICD-10-CM

## 2020-09-21 DIAGNOSIS — E782 Mixed hyperlipidemia: Secondary | ICD-10-CM

## 2020-09-21 MED ORDER — LOVASTATIN 40 MG PO TABS: 40.0000 mg | ORAL_TABLET | Freq: Every day | ORAL | 3 refills | Status: DC

## 2020-09-21 MED ORDER — LISINOPRIL 20 MG PO TABS: 20.0000 mg | ORAL_TABLET | Freq: Every day | ORAL | 0 refills | Status: DC

## 2020-09-21 MED ORDER — AMLODIPINE BESYLATE 10 MG PO TABS: 10.0000 mg | ORAL_TABLET | Freq: Every day | ORAL | 0 refills | Status: DC

## 2020-09-21 MED ORDER — ATENOLOL 50 MG PO TABS: ORAL_TABLET | ORAL | 3 refills | Status: DC

## 2020-10-06 ENCOUNTER — Telehealth: Payer: Self-pay | Admitting: Cardiovascular Disease

## 2020-10-06 ENCOUNTER — Telehealth: Payer: Self-pay

## 2020-10-06 NOTE — Telephone Encounter (Signed)
Return pt call regarding her need for an echo before her upcoming appt with Dr. Rockey Situ on 1/24. She last seen Laurann Montana, NP 10/27 and advised for her to have an echo. Pt was unsure if her EKG was concerning as to why the echo was order, educated pt based on [place], NP notes she stated "demonstrates sinus rhythm 64 bpm with no acute ST/T wave changes." otherwise EKG was "ok". Advised pt Caitlin NP, order the echo for "Mild aortic stenosis by echo 2018 with mean gradient of 30 mmHg", order was placed under the associated diagnoses of "mild aortic stenosis". Provider wanted to see if there was any changes from her last echo in 2018, pt verbalized understanding, stated she was told that from when it was order. Pt stated she called today and was directed to someone in finance and they told her based on her insurance, they would pay 60% and she would half to pay the remaining 40$ which could up to $1,200 OFP exspense. Pt wanted to know if the echo was necessary. Advised pt it would be a good consideration to have a repeat echo since her last was in 2018 and there is concern for aortic stenosis. Pt reports she understands. Offer to reach out to pre-cert to see if they could give an estimate of insurance coverage for her echo and will call Mrs. [place]son back with the cost. Agreeable with plan, will await call back and then determine if she wants to have echo before seeing Dr. Rockey Situ on 1/24

## 2020-10-06 NOTE — Telephone Encounter (Signed)
Patient calling Would like to know if her EKG indicated anything that would have her need an ECHO or if the ECHO is just for check up Patient is having trouble with insurance and and does not want ECHO unless truly necessary  Please call to discuss

## 2020-10-06 NOTE — Telephone Encounter (Signed)
Reach back out to pt after speak with billings, they advised me of same advice they gave pt this am when they spoke with her. Pt verbalized understanding, she will proceed with echo, able to get schedule for 1/17 at 1pm and then has follow-up appt with Dr. Rockey Situ on 1/24. Pt understands to reach out for two different  assistance for out-of-pocket expense after she receives bill, may quality for two different options that were provided to her included completing a financial assistance application for charity coverage (due to her current patient balance being over $3K, she may qualify) and the [employer] card option. Once services are provided a payment plan can be established as well. Pt verbalized understanding, no additional concerns at this time. Agreeable to plan, will call back for anything further.

## 2020-10-11 ENCOUNTER — Other Ambulatory Visit: Payer: 59

## 2020-10-14 ENCOUNTER — Encounter: Payer: 59 | Admitting: Nurse Practitioner

## 2020-10-14 ENCOUNTER — Encounter: Payer: 59 | Admitting: Family Medicine

## 2020-10-18 ENCOUNTER — Ambulatory Visit: Payer: 59 | Admitting: Cardiovascular Disease

## 2020-10-18 ENCOUNTER — Ambulatory Visit (INDEPENDENT_AMBULATORY_CARE_PROVIDER_SITE_OTHER): Payer: 59

## 2020-10-18 ENCOUNTER — Other Ambulatory Visit: Payer: Self-pay

## 2020-10-18 DIAGNOSIS — I350 Nonrheumatic aortic (valve) stenosis: Secondary | ICD-10-CM

## 2020-10-18 LAB — ECHOCARDIOGRAM COMPLETE
AR max vel: 1.93 cm2
AV Peak grad: 13 mmHg
AV Vena cont: 0.35 cm
Ao pk vel: 1.8 m/s
Area-P 1/2: 3.65 cm2
Calc EF: 54.8 %
P 1/2 time: 504 msec
S' Lateral: 3.3 cm
Single Plane A2C EF: 46.3 %
Single Plane A4C EF: 63.7 %

## 2020-10-19 ENCOUNTER — Telehealth: Payer: Self-pay | Admitting: *Deleted

## 2020-10-19 NOTE — Telephone Encounter (Signed)
The patient has been notified of the result and verbalized understanding.  All questions (if any) were answered. Darlyne Russian, RN 10/19/2020 10:47 AM

## 2020-10-19 NOTE — Telephone Encounter (Signed)
-----   Message from Loel Dubonnet, NP sent at 10/19/2020  8:00 AM EST ----- Echocardiogram shows normal heart pumping function. Heart is mildly stiff which is likely due to her history of hypertension. Keeping her blood pressure well controlled will help prevent this from worsening. No evidence of aortic stenosis which is a great result!

## 2020-10-19 NOTE — Telephone Encounter (Signed)
Attempted to call pt to review results. No answer. Lmtcb.

## 2020-10-20 ENCOUNTER — Telehealth: Payer: 59 | Admitting: Family Medicine

## 2020-10-22 ENCOUNTER — Ambulatory Visit: Payer: 59 | Admitting: Family

## 2020-10-22 ENCOUNTER — Encounter: Payer: Self-pay | Admitting: Family

## 2020-10-22 ENCOUNTER — Other Ambulatory Visit: Payer: Self-pay

## 2020-10-22 VITALS — BP 110/60 | HR 63 | Ht 65.5 in | Wt 244.0 lb

## 2020-10-22 DIAGNOSIS — I951 Orthostatic hypotension: Secondary | ICD-10-CM

## 2020-10-22 DIAGNOSIS — I10 Essential (primary) hypertension: Secondary | ICD-10-CM | POA: Diagnosis not present

## 2020-10-22 DIAGNOSIS — E782 Mixed hyperlipidemia: Secondary | ICD-10-CM

## 2020-10-22 DIAGNOSIS — G4733 Obstructive sleep apnea (adult) (pediatric): Secondary | ICD-10-CM

## 2020-10-22 MED ORDER — AMLODIPINE BESYLATE 5 MG PO TABS: 5.0000 mg | ORAL_TABLET | Freq: Every day | ORAL | 1 refills | Status: DC

## 2020-10-22 NOTE — Progress Notes (Signed)
Office Visit    Patient Name: Angela Mullen Date of Encounter: 10/22/2020  Primary Care Provider:  Jon Billings, NP Primary Cardiologist:  Ida Rogue, MD Electrophysiologist:  None   Chief Complaint    Angela Mullen is a 65 y.o. female with a hx of HTN, HLD, DM2, obesity s/p gastric bypass, CKDIII, OSA, GERD,  COPD presents today for follow-up of blood pressure  Past Medical History    Past Medical History:  Diagnosis Date  . Abnormal liver enzymes 09/03/2014  . Acid reflux   . Allergy   . Anemia    H/O  . Anxiety   . Arthritis   . Cataract   . COPD (chronic obstructive pulmonary disease) ([place])   . Depression   . Diabetes mellitus without complication ([place])   . Dyspnea    WITH EXERTION DUE TO COPD  . Elevation of level of transaminase or lactic acid dehydrogenase (LDH) 08/27/2012  . Fatty liver   . Headache    H/O MIGRAINES  . High blood pressure   . High cholesterol   . Sleep apnea    BIPAP   Past Surgical History:  Procedure Laterality Date  . ABDOMINAL HYSTERECTOMY    . BARIATRIC SURGERY  06/15/2017  . CARDIAC CATHETERIZATION    . COLONOSCOPY WITH PROPOFOL N/A 10/10/2018   Procedure: COLONOSCOPY WITH PROPOFOL;  Surgeon: Jonathon Bellows, MD;  Location: [facility] ENDOSCOPY;  Service: Gastroenterology;  Laterality: N/A;  . FRACTURE SURGERY  1990   RIGHT LEG   . KNEE SURGERY    . REPLACEMENT TOTAL KNEE Left   . TONSILLECTOMY    . TOTAL KNEE ARTHROPLASTY Left 04/28/2020   Procedure: TOTAL KNEE ARTHROPLASTY;  Surgeon: Lovell Sheehan, MD;  Location: [facility] ORS;  Service: Orthopedics;  Laterality: Left;  . TUBAL LIGATION      Allergies  Allergies  Allergen Reactions  . Codeine Hives and Nausea Only  . Ketorolac Other (See Comments)    Altered Mental Status-PT DENIES THIS ALLERGY  . Prednisone Other (See Comments)    "wheezing"  . Gabapentin Nausea Only and Other (See Comments)    'weird dreams' increased appetite hallucinations  . Nsaids     DUE  TO HAVING GASTRIC BYPASS SURGERY   History of Present Illness    Angela Mullen is a 65 y.o. female with a hx of f HTN, HLD, DM2, obesity s/p gastric bypass, CKDIII, OSA, GERD,  COPD. She was last seen 07/21/2020  Per previous documentation she had cardiac catheterization October 2015 revealing normal coronary anatomy and normal LV function.  This was performed at [facility] and is unfortunately unavailable in [place].  She had echocardiogram 03/2017 with EF 55%, mild LVH, mild aortic stenosis with mean gradient of 13 mmHg.  She was seen in clinic 04/13/20 for preoperative clearance for knee surgery. Her oldest stepson had a stroke a week prior she was understandably stressed. She noted some intermittent palpitations which were attributed to stress. Clearance for knee surgery was provided which was performed 04/28/20.   Seen in clinic 07/21/2020.  She noted hypotension and lightheadedness.  She had continued to lose weight through working on diet and exercise with subsequent drop in blood pressure.  Her hydrochlorothiazide was discontinued.  Due to lightheadedness and history of mild aortic stenosis by echo 2018 she was recommended for repeat echocardiogram.  Echocardiogram performed 10/18/2020 showing LVEF 35-57%, grade 1 diastolic dysfunction, normal LV global longitudinal strain, RV SF normal, mild aortic valve regurgitation with mild  to moderate aortic valve sclerosis with no evidence of stenosis.  She presents today for follow-up.  We reviewed her echocardiogram in detail.  She was reassured by the results.  Reports her lightheadedness and dizziness have much improved.  She still gets them occasionally with quick changes in position.  Her blood pressure when checked at home is routinely 025-427C systolic.  Reports no shortness of breath at rest.  Tells me her dyspnea on exertion is stable at baseline.. Reports no chest pain, pressure, or tightness. No edema, orthopnea, PND. Reports no  palpitations.  She has had a significant amount of stress recently related to family situations.  EKGs/Labs/Other Studies Reviewed:   The following studies were reviewed today: Echo 10/18/20 1. Left ventricular ejection fraction, by estimation, is 55 to 60%. The  left ventricle has normal function. The left ventricle has no regional  wall motion abnormalities. Left ventricular diastolic parameters are  consistent with Grade I diastolic  dysfunction (impaired relaxation). The average left ventricular global  longitudinal strain is -19.7 %. The global longitudinal strain is normal.   2. Right ventricular systolic function is normal. The right ventricular  size is normal. There is normal pulmonary artery systolic pressure.   3. Left atrial size was mildly dilated.   4. The mitral valve is normal in structure. No evidence of mitral valve  regurgitation. No evidence of mitral stenosis.   5. The aortic valve has an indeterminant number of cusps but likely  tricuspid. Aortic valve regurgitation is mild. Mild to moderate aortic  valve sclerosis/calcification is present, without any evidence of aortic  stenosis.   6. The inferior vena cava is normal in size with greater than 50%  respiratory variability, suggesting right atrial pressure of 3 mmHg.   Echocardiogram 04/18/2017 [facility] EF 55%   NORMAL LEFT VENTRICULAR SYSTOLIC FUNCTION WITH MILD LVH    NORMAL LA PRESSURES WITH NORMAL DIASTOLIC FUNCTION    NORMAL RIGHT VENTRICULAR SYSTOLIC FUNCTION    VALVULAR REGURGITATION: MILD AR, TRIVIAL PR, TRIVIAL TR    VALVULAR STENOSIS: MILD AS (Mean gradient 47m Hg)   MILD AS.    NO PRIOR STUDY FOR COMPARISON   EKG:  EKG is  ordered today.  The ekg ordered today demonstrates NSR 63 bpm with first-degree AV block.  No acute ST/T wave changes.  Recent Labs: 04/08/2020: ALT 20 04/29/2020: BUN 19; Creatinine, Ser 1.41; Hemoglobin 12.3; Platelets 158; Potassium 3.7; Sodium 137  Recent Lipid  Panel    Component Value Date/Time   CHOL 145 04/08/2020 0955   TRIG 108 04/08/2020 0955   HDL 53 04/08/2020 0955   CHOLHDL 3.9 06/04/2018 0948   LDLCALC 72 04/08/2020 0955   Home Medications   Current Meds  Medication Sig  . acetaminophen (TYLENOL) 500 MG tablet Take 1,000 mg by mouth every 6 (six) hours as needed for moderate pain.   .Marland KitchenADVAIR HFA 115-21 MCG/ACT inhaler INHALE 2 PUFFS BY MOUTH EVERY 12 HOURS  . albuterol (PROVENTIL) (2.5 MG/3ML) 0.083% nebulizer solution Take 3 mLs (2.5 mg total) by nebulization every 6 (six) hours as needed for wheezing or shortness of breath.  .Marland Kitchenalbuterol (VENTOLIN HFA) 108 (90 Base) MCG/ACT inhaler Inhale 1-2 puffs into the lungs every 6 (six) hours as needed for wheezing or shortness of breath.  .Marland KitchenamLODipine (NORVASC) 5 MG tablet Take 1 tablet (5 mg total) by mouth daily.  .Marland Kitchenatenolol (TENORMIN) 50 MG tablet Take 50 mg by mouth 2 (two) times daily. Take 1 tablet (  36m) in the morning and 2 tablets (1027m in the evening.  . Marland KitchenuPROPion (WELLBUTRIN SR) 150 MG 12 hr tablet Take 150 mg by mouth 2 (two) times daily.   . clotrimazole-betamethasone (LOTRISONE) cream Apply 1 application topically 2 (two) times daily as needed.  . DULoxetine (CYMBALTA) 30 MG capsule Take 30 mg by mouth at bedtime.   . DULoxetine (CYMBALTA) 60 MG capsule Take 60 mg by mouth at bedtime.   . fluticasone-salmeterol (ADVAIR HFA) 115-21 MCG/ACT inhaler Inhale 2 puffs into the lungs 2 (two) times daily as needed.  . Marland Kitchenisinopril (ZESTRIL) 20 MG tablet Take 1 tablet (20 mg total) by mouth daily.  . Marland Kitchenovastatin (MEVACOR) 40 MG tablet Take 1 tablet (40 mg total) by mouth at bedtime.  . methocarbamol (ROBAXIN) 750 MG tablet TAKE 1 TABLET (750 MG TOTAL) BY MOUTH EVERY 8 (EIGHT) HOURS AS NEEDED.  . Marland Kitchenmeprazole (PRILOSEC) 40 MG capsule Take 40 mg by mouth daily.  . ondansetron (ZOFRAN) 4 MG tablet Take 1 tablet (4 mg total) by mouth every 6 (six) hours as needed for nausea.  . Marland KitchenZEMPIC, 0.25 OR  0.5 MG/DOSE, 2 MG/1.5ML SOPN INJECT 0.25 MG INTO THE SKIN ONCE A WEEK.  . pregabalin (LYRICA) 150 MG capsule TAKE 2 CAPSULES BY MOUTH EVERY DAY  . tiotropium (SPIRIVA) 18 MCG inhalation capsule Place 18 mcg into inhaler and inhale daily as needed.  . traZODone (DESYREL) 100 MG tablet Take 100 mg by mouth at bedtime as needed for sleep.  . Marland KitchenYVANSE 30 MG capsule Take 30 mg by mouth every morning.   . [DISCONTINUED] amLODipine (NORVASC) 10 MG tablet Take 1 tablet (10 mg total) by mouth daily.    Review of Systems    All other systems reviewed and are otherwise negative except as noted above.  Physical Exam    VS:  BP 110/60 (BP Location: Left Arm, Patient Position: Sitting, Cuff Size: Normal)   Pulse 63   Ht 5' 5.5" (1.664 m)   Wt 244 lb (110.7 kg)   SpO2 96%   BMI 39.99 kg/m  , BMI Body mass index is 39.99 kg/m. GEN: Well nourished, overweight, well developed, in no acute distress. HEENT: normal. Neck: Supple, no JVD, carotid bruits, or masses. Cardiac: RRR, no murmurs, rubs, or gallops. No clubbing, cyanosis, edema.  Radials/DP/PT 2+ and equal bilaterally.  Respiratory:  Respirations regular and unlabored, clear to auscultation bilaterally. GI: Soft, nontender, nondistended, BS + x 4. MS: No deformity or atrophy. Skin: Warm and dry, no rash. Neuro:  Strength and sensation are intact. Psych: Normal affect.  Assessment & Plan    1. HTN / Orthostatic hypotension -hydrochlorothiazide discontinued last office visit.  Blood pressure continues to run low normal with occasional lightheadedness and dizziness.  Continue atenolol and lisinopril at present doses.  We will reduce amlodipine to 5 mg daily.  If her blood pressure continues to be low could consider discontinuation.  Make position changes slowly, wear compression stockings.  Her blood pressure likely continues to lower as she has continued to work very hard on her weight loss and was coAdvertising account executive 2. HLD - Lipid panel 04/08/20  with LDL 72. Continue Lovastatin 4075maily.   3. Mild aortic stenosis by echo 2018 with mean gradient of 30 mmHg -Echo 10/18/2020 with mild AI and mild to moderate aortic sclerosis with no evidence of stenosis.  Continue optimal BP and heart rate control.  4. OSA -encouraged to continue to use CPAP  5. GERD - Continue Omeprazole.  Reports symptoms are well controlled  6. CKD III - Careful titration of diuretics and antihypertensive. Continue to follow with PCP. Continue Lisinopril for renal protection.   Disposition: Follow up in 4 month(s) with Dr. Rockey Situ or APP   Loel Dubonnet, NP 10/22/2020, 1:10 PM

## 2020-10-22 NOTE — Patient Instructions (Addendum)
Medication Instructions:  Your physician has recommended you make the following change in your medication:   CHANGE Amlodipine to 43m daily You may use up your 136mtablets by splitting them in half  *If you need a refill on your cardiac medications before your next appointment, please call your pharmacy*   Lab Work: No lab work today.   Testing/Procedures: Your EKG today was stable compared to previous.  Your echocardiogram shows your heart pumping function great! Your heart is mildly stiff which is very common - we prevent this from worsening by continuing to keep your blood pressure well controlled.   Follow-Up: [facility].  As part of our continuing mission to provide you with exceptional heart care, we have created designated Provider Care Teams.  These Care Teams include your primary Cardiologist (physician) and Advanced Practice Providers (APPs -  Physician Assistants and Nurse Practitioners) who all work together to provide you with the care you need, when you need it.  We recommend signing up for the patient portal called "MyChart".  Sign up information is provided on this After Visit Summary.  MyChart is used to connect with patients for Virtual Visits (Telemedicine).  Patients are able to view lab/test results, encounter notes, upcoming appointments, etc.  Non-urgent messages can be sent to your provider as well.   To learn more about what you can do with MyChart, go to [email or web address]   Your next appointment:   4 month(s)  The format for your next appointment:   In Person  Provider:   You may see TiIda RogueMD or one of the following Advanced Practice Providers on your designated Care Team:    ChMurray HodgkinsNP  RyChristell FaithPA-C  JaMarrianne MoodPA-C  Cadence FuAniakPAVermontCaLaurann MontanaNP  Other Instructions  We will be thinking about you during this stressful time. Don't hesitate to call  usKoreaf you need something.   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 Keep up the good work!

## 2020-11-01 ENCOUNTER — Encounter: Payer: Self-pay | Admitting: Family Medicine

## 2020-11-01 ENCOUNTER — Telehealth (INDEPENDENT_AMBULATORY_CARE_PROVIDER_SITE_OTHER): Payer: 59 | Admitting: Family Medicine

## 2020-11-01 DIAGNOSIS — I10 Essential (primary) hypertension: Secondary | ICD-10-CM

## 2020-11-01 DIAGNOSIS — E1169 Type 2 diabetes mellitus with other specified complication: Secondary | ICD-10-CM | POA: Diagnosis not present

## 2020-11-01 DIAGNOSIS — E118 Type 2 diabetes mellitus with unspecified complications: Secondary | ICD-10-CM | POA: Diagnosis not present

## 2020-11-01 DIAGNOSIS — F324 Major depressive disorder, single episode, in partial remission: Secondary | ICD-10-CM

## 2020-11-01 DIAGNOSIS — E785 Hyperlipidemia, unspecified: Secondary | ICD-10-CM

## 2020-11-01 DIAGNOSIS — F411 Generalized anxiety disorder: Secondary | ICD-10-CM

## 2020-11-01 MED ORDER — PREGABALIN 150 MG PO CAPS: 300.0000 mg | ORAL_CAPSULE | Freq: Every day | ORAL | 0 refills | Status: DC

## 2020-11-01 MED ORDER — OZEMPIC (0.25 OR 0.5 MG/DOSE) 2 MG/1.5ML ~~LOC~~ SOPN: 0.2500 mg | PEN_INJECTOR | SUBCUTANEOUS | 0 refills | Status: DC

## 2020-11-01 NOTE — Assessment & Plan Note (Signed)
Under good control on current regimen. Continue current regimen. Continue to monitor. Call with any concerns. Refills through cardiology. Will check labs next visit- due for physical.

## 2020-11-01 NOTE — Assessment & Plan Note (Signed)
Followed by psychiatry. Continue to monitor. Call with any concerns.

## 2020-11-01 NOTE — Progress Notes (Signed)
BP 111/74   Pulse 60   Wt 236 lb (107 kg)   BMI 38.68 kg/m    Subjective:    Patient ID: Angela Mullen, female    DOB: 04-29-1956, 65 y.o.   MRN: [id number]  HPI: Angela Mullen is a 65 y.o. female  Chief Complaint  Patient presents with  . Hypertension  . Diabetes   No longer having any issues with URI symptoms. Feeling much better  DIABETES Hypoglycemic episodes:no Polydipsia/polyuria: no Visual disturbance: no Chest pain: no Paresthesias: no Glucose Monitoring: yes  Accucheck frequency: Daily Taking Insulin?: no Blood Pressure Monitoring: not checking Retinal Examination: Up to Date Foot Exam: Not up to Date Diabetic Education: Completed Pneumovax: Up to Date Influenza: Up to Date Aspirin: no  HYPERTENSION / [place] Satisfied with current treatment? yes Duration of hypertension: chronic BP monitoring frequency: not checking BP medication side effects: no Past BP meds: atenolol, lisinopril, amlodipine Duration of hyperlipidemia: chronic Cholesterol medication side effects: no Cholesterol supplements: none Past cholesterol medications: lovastatin Medication compliance: excellent compliance Aspirin: no Recent stressors: no Recurrent headaches: no Visual changes: no Palpitations: no Dyspnea: no Chest pain: no Lower extremity edema: no Dizzy/lightheaded: no   Relevant past medical, surgical, family and social history reviewed and updated as indicated. Interim medical history since our last visit reviewed. Allergies and medications reviewed and updated.  Review of Systems  Constitutional: Negative.   Respiratory: Negative.   Cardiovascular: Negative.   Gastrointestinal: Negative.   Genitourinary: Negative.   Musculoskeletal: Negative.   Neurological: Negative.   Psychiatric/Behavioral: Negative.     Per HPI unless specifically indicated above     Objective:    BP 111/74   Pulse 60   Wt 236 lb (107 kg)   BMI 38.68 kg/m    Wt Readings from Last 3 Encounters:  11/01/20 236 lb (107 kg)  10/22/20 244 lb (110.7 kg)  07/21/20 250 lb (113.4 kg)    Physical Exam Vitals and nursing note reviewed.  Constitutional:      General: She is not in acute distress.    Appearance: Normal appearance. She is not ill-appearing, toxic-appearing or diaphoretic.  HENT:     Head: Normocephalic and atraumatic.     Right Ear: External ear normal.     Left Ear: External ear normal.     Nose: Nose normal.     Mouth/Throat:     Mouth: Mucous membranes are moist.     Pharynx: Oropharynx is clear.  Eyes:     General: No scleral icterus.       Right eye: No discharge.        Left eye: No discharge.     Conjunctiva/sclera: Conjunctivae normal.     Pupils: Pupils are equal, round, and reactive to light.  Pulmonary:     Effort: Pulmonary effort is normal. No respiratory distress.     Comments: Speaking in full sentences Musculoskeletal:        General: Normal range of motion.     Cervical back: Normal range of motion.  Skin:    Coloration: Skin is not jaundiced or pale.     Findings: No bruising, erythema, lesion or rash.  Neurological:     Mental Status: She is alert and oriented to person, place, and time. Mental status is at baseline.  Psychiatric:        Mood and Affect: Mood normal.        Behavior: Behavior normal.  Thought Content: Thought content normal.        Judgment: Judgment normal.     Results for orders placed or performed in visit on 10/18/20  ECHOCARDIOGRAM COMPLETE  Result Value Ref Range   AR max vel 1.93 cm2   AV Peak grad 13.0 mmHg   Ao pk vel 1.80 m/s   S' Lateral 3.30 cm   Area-P 1/2 3.65 cm2   Single Plane A4C EF 63.7 %   Single Plane A2C EF 46.3 %   Calc EF 54.8 %   P 1/2 time 504 msec   AV Vena cont 0.35 cm      Assessment & Plan:   Problem List Items Addressed This Visit      Cardiovascular and Mediastinum   Essential (primary) hypertension    Under good control on current  regimen. Continue current regimen. Continue to monitor. Call with any concerns. Refills through cardiology. Will check labs next visit- due for physical.           Endocrine   Hyperlipidemia associated with type 2 diabetes mellitus ([place])    Under good control on current regimen. Continue current regimen. Continue to monitor. Call with any concerns. Refills through cardiology. Will check labs next visit- due for physical.        Relevant Medications   Semaglutide,0.25 or 0.5MG/DOS, (OZEMPIC, 0.25 OR 0.5 MG/DOSE,) 2 MG/1.5ML SOPN   DM (diabetes mellitus), type 2 with complications ([place])    Due for recheck on her A1c. Will get her in ASAP for physical. Refill of ozempic given today. Tolerating low dose well- will adjust as needed.       Relevant Medications   Semaglutide,0.25 or 0.5MG/DOS, (OZEMPIC, 0.25 OR 0.5 MG/DOSE,) 2 MG/1.5ML SOPN     Other   Depression, major, single episode, in partial remission ([place])    Followed by psychiatry. Continue to monitor. Call with any concerns.       Generalized anxiety disorder    Followed by psychiatry. Continue to monitor. Call with any concerns.           Follow up plan: Return in about 4 weeks (around 11/29/2020) for physical.   . This visit was completed via MyChart due to the restrictions of the COVID-19 pandemic. All issues as above were discussed and addressed. Physical exam was done as above through visual confirmation on MyChart. If it was felt that the patient should be evaluated in the office, they were directed there. The patient verbally consented to this visit. . Location of the patient: home . Location of the provider: work . Those involved with this call:  . Provider: Park Liter, DO . CMA: Louanna Raw, [place] . Front Desk/Registration: Jill Side  . Time spent on call: 25 minutes with patient face to face via video conference. More than 50% of this time was spent in counseling and coordination of care. 40 minutes total  spent in review of patient's record and preparation of their chart.

## 2020-11-01 NOTE — Assessment & Plan Note (Signed)
Due for recheck on her A1c. Will get her in ASAP for physical. Refill of ozempic given today. Tolerating low dose well- will adjust as needed.

## 2020-12-02 ENCOUNTER — Encounter: Payer: Self-pay | Admitting: Nurse Practitioner

## 2020-12-02 ENCOUNTER — Other Ambulatory Visit: Payer: Self-pay

## 2020-12-02 ENCOUNTER — Ambulatory Visit: Payer: 59 | Admitting: Nurse Practitioner

## 2020-12-02 VITALS — BP 97/60 | HR 63 | Temp 98.0°F | Ht 64.96 in | Wt 242.4 lb

## 2020-12-02 DIAGNOSIS — I10 Essential (primary) hypertension: Secondary | ICD-10-CM

## 2020-12-02 DIAGNOSIS — Z0000 Encounter for general adult medical examination without abnormal findings: Secondary | ICD-10-CM

## 2020-12-02 DIAGNOSIS — Z1231 Encounter for screening mammogram for malignant neoplasm of breast: Secondary | ICD-10-CM

## 2020-12-02 DIAGNOSIS — F324 Major depressive disorder, single episode, in partial remission: Secondary | ICD-10-CM | POA: Diagnosis not present

## 2020-12-02 DIAGNOSIS — G4733 Obstructive sleep apnea (adult) (pediatric): Secondary | ICD-10-CM

## 2020-12-02 DIAGNOSIS — F411 Generalized anxiety disorder: Secondary | ICD-10-CM

## 2020-12-02 DIAGNOSIS — Z23 Encounter for immunization: Secondary | ICD-10-CM | POA: Diagnosis not present

## 2020-12-02 DIAGNOSIS — J449 Chronic obstructive pulmonary disease, unspecified: Secondary | ICD-10-CM

## 2020-12-02 DIAGNOSIS — E118 Type 2 diabetes mellitus with unspecified complications: Secondary | ICD-10-CM

## 2020-12-02 DIAGNOSIS — E1169 Type 2 diabetes mellitus with other specified complication: Secondary | ICD-10-CM

## 2020-12-02 DIAGNOSIS — N1832 Chronic kidney disease, stage 3b: Secondary | ICD-10-CM

## 2020-12-02 LAB — MICROSCOPIC EXAMINATION: Bacteria, UA: NONE SEEN

## 2020-12-02 LAB — URINALYSIS, ROUTINE W REFLEX MICROSCOPIC
Bilirubin, UA: NEGATIVE
Glucose, UA: NEGATIVE
Ketones, UA: NEGATIVE
Nitrite, UA: NEGATIVE
Protein,UA: NEGATIVE
RBC, UA: NEGATIVE
Specific Gravity, UA: >1.03 — ABNORMAL HIGH (ref 1.005–1.030)
Urobilinogen, Ur: 0.2 mg/dL (ref 0.2–1.0)
pH, UA: 5.5 (ref 5.0–7.5)

## 2020-12-02 NOTE — Progress Notes (Addendum)
BP 97/60   Pulse 63   Temp 98 F (36.7 C)   Ht 5' 4.96" (1.65 m)   Wt 242 lb 6 oz (109.9 kg)   SpO2 91%   BMI 40.38 kg/m    Subjective:    Patient ID: Angela Mullen, female    DOB: 1956-04-03, 65 y.o.   MRN: [id number]  HPI: Angela Mullen is a 65 y.o. female presenting on 12/02/2020 for comprehensive medical examination. Current medical complaints include: fatigue.  She currently lives with: Husband Menopausal Symptoms: no   Patient states that since around Christmas time she has been feeling very fatigued.  Patient states she wants to sleep a lot.  Patient saw Cardiology who decreased her Amlodipine to 2.72m versus 535m  Patient's blood pressure is 112/80s in the morning before taking her medications.  Patient does not have a plan to go back and see Cardiology until her yearly appointment.  DIABETES Hypoglycemic episodes:no Polydipsia/polyuria: no Visual disturbance: no Chest pain: no Paresthesias: no Glucose Monitoring: yes             Accucheck frequency: Daily Taking Insulin?: no Blood Pressure Monitoring: not checking Retinal Examination: Up to Date Foot Exam: Not up to Date Diabetic Education: Completed Pneumovax: Up to Date Influenza: Up to Date Aspirin: no  HYPERTENSION / HYSt. Augustaatisfied with current treatment? yes Duration of hypertension: chronic BP monitoring frequency: not checking BP medication side effects: no Past BP meds: atenolol, lisinopril, amlodipine Duration of hyperlipidemia: chronic Cholesterol medication side effects: no Cholesterol supplements: none Past cholesterol medications: lovastatin Medication compliance: excellent compliance Aspirin: no Recent stressors: no Recurrent headaches: no Visual changes: no Palpitations: no Dyspnea: no Chest pain: no Lower extremity edema: no Dizzy/lightheaded: no  DEPRESSION/ANXIETY Patient see's a psychiatrist who manages her medications.  Patient plans to discuss her fatigue  with her at the next appointment on Wednesday.  Patient is not very comfortable talking with her Psychiatrist about ongoing problems. Patient denies SI/HI in office today.   Functional Status Survey: Is the patient deaf or have difficulty hearing?: No Does the patient have difficulty seeing, even when wearing glasses/contacts?: No Does the patient have difficulty concentrating, remembering, or making decisions?: Yes Does the patient have difficulty walking or climbing stairs?: No Does the patient have difficulty dressing or bathing?: No Does the patient have difficulty doing errands alone such as visiting a doctor's office or shopping?: No  Fall Risk  12/02/2020 08/07/2018  Falls in the past year? 0 0  Number falls in past yr: 0 -  Injury with Fall? 0 -  Follow up - Falls evaluation completed    Depression Screen [facility] 12/02/2020 11/01/2020 04/08/2020 03/01/2020 08/07/2018  Decreased Interest 0 1 0 1 1  Down, Depressed, Hopeless 0 1 1 1 1   PHQ - 2 Score 0 2 1 2 2   Altered sleeping - 1 0 - 1  Tired, decreased energy - 3 1 - 1  Change in appetite - 1 1 - 1  Feeling bad or failure about yourself  - 1 1 - 1  Trouble concentrating - 1 0 - 1  Moving slowly or fidgety/restless - 1 0 - 1  Suicidal thoughts - 0 0 - 0  PHQ-9 Score - 10 4 - 8  Difficult doing work/chores - Somewhat difficult - - -     Past Medical History:  Past Medical History:  Diagnosis Date  . Abnormal liver enzymes 09/03/2014  . Acid reflux   .  Allergy   . Anemia    H/O  . Anxiety   . Arthritis   . Cataract   . COPD (chronic obstructive pulmonary disease) ([place])   . Depression   . Diabetes mellitus without complication ([place])   . Dyspnea    WITH EXERTION DUE TO COPD  . Elevation of level of transaminase or lactic acid dehydrogenase (LDH) 08/27/2012  . Fatty liver   . Headache    H/O MIGRAINES  . High blood pressure   . High cholesterol   . Sleep apnea    BIPAP    Surgical History:   Past Surgical History:  Procedure Laterality Date  . ABDOMINAL HYSTERECTOMY    . BARIATRIC SURGERY  06/15/2017  . CARDIAC CATHETERIZATION    . COLONOSCOPY WITH PROPOFOL N/A 10/10/2018   Procedure: COLONOSCOPY WITH PROPOFOL;  Surgeon: Jonathon Bellows, MD;  Location: [facility] ENDOSCOPY;  Service: Gastroenterology;  Laterality: N/A;  . FRACTURE SURGERY  1990   RIGHT LEG   . KNEE SURGERY    . REPLACEMENT TOTAL KNEE Left   . TONSILLECTOMY    . TOTAL KNEE ARTHROPLASTY Left 04/28/2020   Procedure: TOTAL KNEE ARTHROPLASTY;  Surgeon: Lovell Sheehan, MD;  Location: [facility] ORS;  Service: Orthopedics;  Laterality: Left;  . TUBAL LIGATION      Medications:  Current Outpatient Medications on File Prior to Visit  Medication Sig  . acetaminophen (TYLENOL) 500 MG tablet Take 1,000 mg by mouth every 6 (six) hours as needed for moderate pain.   Marland Kitchen ADVAIR HFA 115-21 MCG/ACT inhaler INHALE 2 PUFFS BY MOUTH EVERY 12 HOURS  . albuterol (PROVENTIL) (2.5 MG/3ML) 0.083% nebulizer solution Take 3 mLs (2.5 mg total) by nebulization every 6 (six) hours as needed for wheezing or shortness of breath.  Marland Kitchen albuterol (VENTOLIN HFA) 108 (90 Base) MCG/ACT inhaler Inhale 1-2 puffs into the lungs every 6 (six) hours as needed for wheezing or shortness of breath.  Marland Kitchen amLODipine (NORVASC) 5 MG tablet Take 1 tablet (5 mg total) by mouth daily.  Marland Kitchen atenolol (TENORMIN) 50 MG tablet Take 50 mg by mouth 2 (two) times daily. Take 1 tablet (68m) in the morning and 2 tablets (1063m in the evening.  . Marland KitchenuPROPion (WELLBUTRIN SR) 150 MG 12 hr tablet Take 150 mg by mouth 2 (two) times daily.   . clotrimazole-betamethasone (LOTRISONE) cream Apply 1 application topically 2 (two) times daily as needed.  . DULoxetine (CYMBALTA) 30 MG capsule Take 30 mg by mouth at bedtime.   . DULoxetine (CYMBALTA) 60 MG capsule Take 60 mg by mouth at bedtime.   . fluticasone-salmeterol (ADVAIR HFA) 115-21 MCG/ACT inhaler Inhale 2 puffs into the lungs 2 (two) times daily  as needed.  . Marland Kitchenisinopril (ZESTRIL) 20 MG tablet Take 1 tablet (20 mg total) by mouth daily.  . Marland Kitchenovastatin (MEVACOR) 40 MG tablet Take 1 tablet (40 mg total) by mouth at bedtime.  . methocarbamol (ROBAXIN) 750 MG tablet TAKE 1 TABLET (750 MG TOTAL) BY MOUTH EVERY 8 (EIGHT) HOURS AS NEEDED.  . Marland Kitchenmeprazole (PRILOSEC) 40 MG capsule Take 40 mg by mouth daily.  . pregabalin (LYRICA) 150 MG capsule Take 2 capsules (300 mg total) by mouth daily.  . Semaglutide,0.25 or 0.5MG/DOS, (OZEMPIC, 0.25 OR 0.5 MG/DOSE,) 2 MG/1.5ML SOPN Inject 0.25 mg into the skin once a week.  . tiotropium (SPIRIVA) 18 MCG inhalation capsule Place 18 mcg into inhaler and inhale daily as needed.  . traZODone (DESYREL) 100 MG tablet Take 100 mg by mouth at  bedtime as needed for sleep.  Marland Kitchen VYVANSE 30 MG capsule Take 30 mg by mouth every morning.    No current facility-administered medications on file prior to visit.    Allergies:  Allergies  Allergen Reactions  . Ketorolac Other (See Comments)    Altered Mental Status-PT DENIES THIS ALLERGY  . Prednisone Other (See Comments)    "wheezing"  . Nsaids     DUE TO HAVING GASTRIC BYPASS SURGERY    Social History:  Social History   Socioeconomic History  . Marital status: Married    Spouse name: Not on file  . Number of children: Not on file  . Years of education: Not on file  . Highest education level: Not on file  Occupational History  . Not on file  Tobacco Use  . Smoking status: Never Smoker  . Smokeless tobacco: Never Used  Vaping Use  . Vaping Use: Never used  Substance and Sexual Activity  . Alcohol use: Yes    Comment: occassional  . Drug use: No  . Sexual activity: Not Currently  Other Topics Concern  . Not on file  Social History Narrative  . Not on file   Social Determinants of Health   Financial Resource Strain: Not on file  Food Insecurity: Not on file  Transportation Needs: Not on file  Physical Activity: Not on file  Stress: Not on file   Social Connections: Not on file  Intimate Partner Violence: Not on file   Social History   Tobacco Use  Smoking Status Never Smoker  Smokeless Tobacco Never Used   Social History   Substance and Sexual Activity  Alcohol Use Yes   Comment: occassional    Family History:  Family History  Problem Relation Age of Onset  . Cerebral aneurysm Mother   . Stroke Father   . Diabetes Father   . Depression Sister   . Obesity Sister   . Kidney disease Sister   . Heart attack Sister   . Stroke Sister   . Hypertension Sister   . Diabetes Brother   . High blood pressure Brother   . Heart attack Maternal Grandfather   . Hyperlipidemia Son   . Stroke Maternal Grandmother   . Diabetes Paternal Grandfather   . Hyperlipidemia Paternal Grandfather   . Heart disease Paternal Grandfather     Past medical history, surgical history, medications, allergies, family history and social history reviewed with patient today and changes made to appropriate areas of the chart.   Review of Systems  Constitutional: Positive for malaise/fatigue.  Eyes: Negative for blurred vision and double vision.  Respiratory: Negative for shortness of breath.   Cardiovascular: Negative for chest pain, palpitations and leg swelling.  Neurological: Negative for dizziness and headaches.    All other ROS negative except what is listed above and in the HPI.      Objective:    BP 97/60   Pulse 63   Temp 98 F (36.7 C)   Ht 5' 4.96" (1.65 m)   Wt 242 lb 6 oz (109.9 kg)   SpO2 91%   BMI 40.38 kg/m   Wt Readings from Last 3 Encounters:  12/02/20 242 lb 6 oz (109.9 kg)  11/01/20 236 lb (107 kg)  10/22/20 244 lb (110.7 kg)    Physical Exam Vitals and nursing note reviewed.  Constitutional:      General: She is awake. She is not in acute distress.    Appearance: She is well-developed. She is obese.  She is not ill-appearing.  HENT:     Head: Normocephalic and atraumatic.     Right Ear: Hearing, tympanic  membrane, ear canal and external ear normal. No drainage.     Left Ear: Hearing, tympanic membrane, ear canal and external ear normal. No drainage.     Nose: Nose normal.     Right Sinus: No maxillary sinus tenderness or frontal sinus tenderness.     Left Sinus: No maxillary sinus tenderness or frontal sinus tenderness.     Mouth/Throat:     Mouth: Mucous membranes are moist.     Pharynx: Oropharynx is clear. Uvula midline. No pharyngeal swelling, oropharyngeal exudate or posterior oropharyngeal erythema.  Eyes:     General: Lids are normal.        Right eye: No discharge.        Left eye: No discharge.     Extraocular Movements: Extraocular movements intact.     Conjunctiva/sclera: Conjunctivae normal.     Pupils: Pupils are equal, round, and reactive to light.     Visual Fields: Right eye visual fields normal and left eye visual fields normal.  Neck:     Thyroid: No thyromegaly.     Vascular: No carotid bruit.     Trachea: Trachea normal.  Cardiovascular:     Rate and Rhythm: Normal rate and regular rhythm.     Pulses:          Dorsalis pedis pulses are 2+ on the right side and 2+ on the left side.     Heart sounds: Normal heart sounds. No murmur heard. No gallop.   Pulmonary:     Effort: Pulmonary effort is normal. No accessory muscle usage or respiratory distress.     Breath sounds: Normal breath sounds.  Chest:  Breasts:     Right: Normal. No axillary adenopathy or supraclavicular adenopathy.     Left: Normal. No axillary adenopathy or supraclavicular adenopathy.    Abdominal:     General: Bowel sounds are normal.     Palpations: Abdomen is soft. There is no hepatomegaly or splenomegaly.     Tenderness: There is no abdominal tenderness.  Musculoskeletal:        General: Normal range of motion.     Cervical back: Normal range of motion and neck supple.     Right lower leg: No edema.     Left lower leg: No edema.  Feet:     Right foot:     Protective Sensation: 10  sites tested. 6 sites sensed.     Skin integrity: Skin integrity normal.     Toenail Condition: Right toenails are normal.     Left foot:     Protective Sensation: 10 sites tested. 6 sites sensed.     Skin integrity: Skin integrity normal.     Toenail Condition: Left toenails are normal.  Lymphadenopathy:     Head:     Right side of head: No submental, submandibular, tonsillar, preauricular or posterior auricular adenopathy.     Left side of head: No submental, submandibular, tonsillar, preauricular or posterior auricular adenopathy.     Cervical: No cervical adenopathy.     Upper Body:     Right upper body: No supraclavicular, axillary or pectoral adenopathy.     Left upper body: No supraclavicular, axillary or pectoral adenopathy.  Skin:    General: Skin is warm and dry.     Capillary Refill: Capillary refill takes less than 2 seconds.  Findings: No rash.  Neurological:     Mental Status: She is alert and oriented to person, place, and time.     Cranial Nerves: Cranial nerves are intact.     Gait: Gait is intact.     Deep Tendon Reflexes: Reflexes are normal and symmetric.     Reflex Scores:      Brachioradialis reflexes are 2+ on the right side and 2+ on the left side.      Patellar reflexes are 2+ on the right side and 2+ on the left side. Psychiatric:        Attention and Perception: Attention normal.        Mood and Affect: Mood normal.        Speech: Speech normal.        Behavior: Behavior normal. Behavior is cooperative.        Thought Content: Thought content normal.        Judgment: Judgment normal.      Results for orders placed or performed in visit on 10/18/20  ECHOCARDIOGRAM COMPLETE  Result Value Ref Range   AR max vel 1.93 cm2   AV Peak grad 13.0 mmHg   Ao pk vel 1.80 m/s   S' Lateral 3.30 cm   Area-P 1/2 3.65 cm2   Single Plane A4C EF 63.7 %   Single Plane A2C EF 46.3 %   Calc EF 54.8 %   P 1/2 time 504 msec   AV Vena cont 0.35 cm       Assessment & Plan:   Problem List Items Addressed This Visit      Cardiovascular and Mediastinum   Essential (primary) hypertension    Chronic.  Patient has         Respiratory   Chronic obstructive pulmonary disease (HCC)    Stable on current inhaler regimen (advair, spiriva, albuterol prn). Continue current regimen.      OSA (obstructive sleep apnea)    Stable on CPAP, continue nightly use as well as efforts toward continued weight loss.        Endocrine   Hyperlipidemia associated with type 2 diabetes mellitus ([place])    Under good control on current regimen. Continue current regimen. Continue to monitor. Call with any concerns. Refills through cardiology. Will check labs next visit- due for physical.        DM (diabetes mellitus), type 2 with complications (HCC)    Chronic.  Stable on current regimen.  Labs ordered today.       Relevant Orders   HgB A1c     Genitourinary   Chronic kidney disease (CKD), stage III (moderate) (HCC)    Chronic.  Stable.  Will continue to monitor.  Avoid NSAIDS.        Other   Depression, major, single episode, in partial remission ([place])    Followed by psychiatry. Continue to monitor.Will consider managing medications in office due.  Patient agrees to stop Vyvanse for weight loss if medications are managed in office. Follow up in one month for further evaluation and discussion.       Generalized anxiety disorder    Followed by psychiatry. Continue to monitor.Will consider managing medications in office due.  Patient agrees to stop Vyvanse for weight loss if medications are managed in office. Follow up in one month for further evaluation and discussion.       Morbid obesity with body mass index of 40.0-49.9 (HCC)    Chronic. Discussed weight loss with patient during  visit.  Patient has been working on weight loss.  Will consider increase Ozempic to aid in weight loss.        Other Visit Diagnoses    Annual physical exam    -   Primary   Health Maintenance discussed with patient at appointment today.  Lab work ordered.    Relevant Orders   CBC with Differential/Platelet   Comprehensive metabolic panel   Lipid panel   TSH   Urinalysis, Routine w reflex microscopic   HgB A1c   Encounter for screening mammogram for malignant neoplasm of breast       Relevant Orders   MM Digital Screening   Immunization due       Relevant Orders   Td vaccine greater than or equal to 7yo preservative free IM (Completed)       Preventative Services:  Breast Cancer Screening: Order today Cervical Cancer Screening: Up to Date Colon Cancer Screening: Up to Date Depression Screening: Done Today Diabetes Screening: Done Today Glaucoma Screening: Due 11/2020 Hepatitis B vaccine: Hepatitis C screening: Up to Date HIV Screening: Up to Date Flu Vaccine: Not Up to Date Lung cancer Screening: NA Obesity Screening: Done Today Pneumonia Vaccines (2): NA   Follow up plan: Return in about 1 month (around 01/02/2021) for BP Check and Depression (17mns).   LABORATORY TESTING:  - Pap smear: up to date  IMMUNIZATIONS:   - Tdap: Tetanus vaccination status reviewed: last tetanus booster within 10 years. - Influenza: Up to date - Pneumovax: Not applicable - Prevnar: Not applicable   SCREENING: -Mammogram: Ordered today  - Colonoscopy: Up to date  - Bone Density: Not applicable    PATIENT COUNSELING:   Advised to take 1 mg of folate supplement per day if capable of pregnancy.   Sexuality: Discussed sexually transmitted diseases, partner selection, use of condoms, avoidance of unintended pregnancy  and contraceptive alternatives.   Advised to avoid cigarette smoking.  I discussed with the patient that most people either abstain from alcohol or drink within safe limits (<=14/week and <=4 drinks/occasion for males, <=7/weeks and <= 3 drinks/occasion for females) and that the risk for alcohol disorders and other health effects  rises proportionally with the number of drinks per week and how often a drinker exceeds daily limits.  Discussed cessation/primary prevention of drug use and availability of treatment for abuse.   Diet: Encouraged to adjust caloric intake to maintain  or achieve ideal body weight, to reduce intake of dietary saturated fat and total fat, to limit sodium intake by avoiding high sodium foods and not adding table salt, and to maintain adequate dietary potassium and calcium preferably from fresh fruits, vegetables, and low-fat dairy products.    stressed the importance of regular exercise  Injury prevention: Discussed safety belts, safety helmets, smoke detector, smoking near bedding or upholstery.   Dental health: Discussed importance of regular tooth brushing, flossing, and dental visits.    NEXT PREVENTATIVE PHYSICAL DUE IN 1 YEAR. Return in about 1 month (around 01/02/2021) for BP Check and Depression (42ms).

## 2020-12-02 NOTE — Assessment & Plan Note (Signed)
Chronic.  Stable.  Will continue to monitor.  Avoid NSAIDS.

## 2020-12-02 NOTE — Assessment & Plan Note (Signed)
Chronic.  Patient has

## 2020-12-02 NOTE — Assessment & Plan Note (Signed)
Stable on current inhaler regimen (advair, spiriva, albuterol prn). Continue current regimen.

## 2020-12-02 NOTE — Assessment & Plan Note (Addendum)
Followed by psychiatry. Continue to monitor.Will consider managing medications in office due.  Patient agrees to stop Vyvanse for weight loss if medications are managed in office. Follow up in one month for further evaluation and discussion.

## 2020-12-02 NOTE — Assessment & Plan Note (Addendum)
Chronic. Discussed weight loss with patient during visit.  Patient has been working on weight loss.  Will consider increase Ozempic to aid in weight loss.

## 2020-12-02 NOTE — Assessment & Plan Note (Signed)
Under good control on current regimen. Continue current regimen. Continue to monitor. Call with any concerns. Refills through cardiology. Will check labs next visit- due for physical.

## 2020-12-02 NOTE — Assessment & Plan Note (Signed)
Chronic.  Stable on current regimen.  Labs ordered today.

## 2020-12-02 NOTE — Assessment & Plan Note (Signed)
Stable on CPAP, continue nightly use as well as efforts toward continued weight loss.

## 2020-12-03 ENCOUNTER — Other Ambulatory Visit: Payer: 59

## 2020-12-03 LAB — LIPID PANEL
Chol/HDL Ratio: 2.4 ratio (ref 0.0–4.4)
Cholesterol, Total: 144 mg/dL (ref 100–199)
HDL: 60 mg/dL (ref >39)
LDL Chol Calc (NIH): 64 mg/dL (ref 0–99)
Triglycerides: 109 mg/dL (ref 0–149)
VLDL Cholesterol Cal: 20 mg/dL (ref 5–40)

## 2020-12-03 LAB — CBC WITH DIFFERENTIAL/PLATELET
Basophils Absolute: 0.1 10*3/uL (ref 0.0–0.2)
Basos: 1 %
EOS (ABSOLUTE): 0.4 10*3/uL (ref 0.0–0.4)
Eos: 4 %
Hematocrit: 41.5 % (ref 34.0–46.6)
Hemoglobin: 13.3 g/dL (ref 11.1–15.9)
Immature Grans (Abs): 0 10*3/uL (ref 0.0–0.1)
Immature Granulocytes: 0 %
Lymphocytes Absolute: 1.6 10*3/uL (ref 0.7–3.1)
Lymphs: 19 %
MCH: 29.4 pg (ref 26.6–33.0)
MCHC: 32 g/dL (ref 31.5–35.7)
MCV: 92 fL (ref 79–97)
Monocytes Absolute: 0.5 10*3/uL (ref 0.1–0.9)
Monocytes: 6 %
Neutrophils Absolute: 6 10*3/uL (ref 1.4–7.0)
Neutrophils: 70 %
Platelets: 189 10*3/uL (ref 150–450)
RBC: 4.53 x10E6/uL (ref 3.77–5.28)
RDW: 12.2 % (ref 11.7–15.4)
WBC: 8.5 10*3/uL (ref 3.4–10.8)

## 2020-12-03 LAB — COMPREHENSIVE METABOLIC PANEL
ALT: 24 IU/L (ref 0–32)
AST: 23 IU/L (ref 0–40)
Albumin/Globulin Ratio: 1.9 (ref 1.2–2.2)
Albumin: 4.3 g/dL (ref 3.8–4.8)
Alkaline Phosphatase: 110 IU/L (ref 44–121)
BUN/Creatinine Ratio: 8 — ABNORMAL LOW (ref 12–28)
BUN: 15 mg/dL (ref 8–27)
Bilirubin Total: 0.3 mg/dL (ref 0.0–1.2)
CO2: 20 mmol/L (ref 20–29)
Calcium: 9.3 mg/dL (ref 8.7–10.3)
Chloride: 107 mmol/L — ABNORMAL HIGH (ref 96–106)
Creatinine, Ser: 1.81 mg/dL — ABNORMAL HIGH (ref 0.57–1.00)
Globulin, Total: 2.3 g/dL (ref 1.5–4.5)
Glucose: 90 mg/dL (ref 65–99)
Potassium: 3.9 mmol/L (ref 3.5–5.2)
Sodium: 143 mmol/L (ref 134–144)
Total Protein: 6.6 g/dL (ref 6.0–8.5)
eGFR: 31 mL/min/{1.73_m2} — ABNORMAL LOW (ref >59)

## 2020-12-03 LAB — HEMOGLOBIN A1C
Est. average glucose Bld gHb Est-mCnc: 111 mg/dL
Hgb A1c MFr Bld: 5.5 % (ref 4.8–5.6)

## 2020-12-03 LAB — TSH: TSH: 1.21 u[IU]/mL (ref 0.450–4.500)

## 2020-12-03 NOTE — Addendum Note (Signed)
Addended by: Jon Billings on: 12/03/2020 12:01 PM   Modules accepted: Orders

## 2020-12-03 NOTE — Progress Notes (Signed)
Please call patient and let her know that her lab work shows her kidney function worsened a little bit since it was last checked.  Her urine also has some cystals and mucus.  Both of these things could be related to hydration. She needs to make sure she is drinking at least 64 ounces of water daily.  I would like to repeat her urine and her kidney function in one week.  Please schedule patient an appointment to come in for lab work next Friday.  Otherwise, lab work looks good.  A1c remains well controlled at 5.9.  I will see her for our in person visit in 1 month.

## 2020-12-09 ENCOUNTER — Other Ambulatory Visit: Payer: Self-pay

## 2020-12-09 ENCOUNTER — Other Ambulatory Visit: Payer: 59

## 2020-12-09 ENCOUNTER — Other Ambulatory Visit: Payer: Self-pay | Admitting: Nurse Practitioner

## 2020-12-09 DIAGNOSIS — N1832 Chronic kidney disease, stage 3b: Secondary | ICD-10-CM

## 2020-12-09 DIAGNOSIS — Z1231 Encounter for screening mammogram for malignant neoplasm of breast: Secondary | ICD-10-CM

## 2020-12-09 LAB — MICROSCOPIC EXAMINATION
Bacteria, UA: NONE SEEN
RBC, Urine: NONE SEEN /hpf (ref 0–2)

## 2020-12-09 LAB — URINALYSIS, ROUTINE W REFLEX MICROSCOPIC
Bilirubin, UA: NEGATIVE
Glucose, UA: NEGATIVE
Ketones, UA: NEGATIVE
Nitrite, UA: NEGATIVE
RBC, UA: NEGATIVE
Specific Gravity, UA: >1.03 — ABNORMAL HIGH (ref 1.005–1.030)
Urobilinogen, Ur: 0.2 mg/dL (ref 0.2–1.0)
pH, UA: 6 (ref 5.0–7.5)

## 2020-12-10 LAB — COMPREHENSIVE METABOLIC PANEL
ALT: 27 IU/L (ref 0–32)
AST: 23 IU/L (ref 0–40)
Albumin/Globulin Ratio: 1.9 (ref 1.2–2.2)
Albumin: 4.1 g/dL (ref 3.8–4.8)
Alkaline Phosphatase: 112 IU/L (ref 44–121)
BUN/Creatinine Ratio: 9 — ABNORMAL LOW (ref 12–28)
BUN: 14 mg/dL (ref 8–27)
Bilirubin Total: 0.3 mg/dL (ref 0.0–1.2)
CO2: 19 mmol/L — ABNORMAL LOW (ref 20–29)
Calcium: 9 mg/dL (ref 8.7–10.3)
Chloride: 112 mmol/L — ABNORMAL HIGH (ref 96–106)
Creatinine, Ser: 1.53 mg/dL — ABNORMAL HIGH (ref 0.57–1.00)
Globulin, Total: 2.2 g/dL (ref 1.5–4.5)
Glucose: 90 mg/dL (ref 65–99)
Potassium: 4.3 mmol/L (ref 3.5–5.2)
Sodium: 147 mmol/L — ABNORMAL HIGH (ref 134–144)
Total Protein: 6.3 g/dL (ref 6.0–8.5)
eGFR: 38 mL/min/{1.73_m2} — ABNORMAL LOW (ref >59)

## 2020-12-10 MED ORDER — NITROFURANTOIN MONOHYD MACRO 100 MG PO CAPS
100.0000 mg | ORAL_CAPSULE | Freq: Two times a day (BID) | ORAL | 0 refills | Status: DC
Start: 2020-12-10 — End: 2021-01-11

## 2020-12-10 NOTE — Progress Notes (Signed)
Please call patient and let her know that her Kidney function has returned to baseline although it is still abnormal and showing some chronic kidney disease.  Her urine also looks like she has an infection so I am going to go ahead and treat her with a short course of antibiotics.  We can discuss all of this further at her follow up.  Please let me know if patient has any questions.

## 2020-12-10 NOTE — Progress Notes (Signed)
I sent it to [facility] in [place].

## 2020-12-22 ENCOUNTER — Other Ambulatory Visit: Payer: Self-pay | Admitting: Family

## 2020-12-27 ENCOUNTER — Inpatient Hospital Stay: Admission: RE | Admit: 2020-12-27 | Payer: 59 | Source: Ambulatory Visit

## 2020-12-29 ENCOUNTER — Inpatient Hospital Stay: Admission: RE | Admit: 2020-12-29 | Payer: 59 | Source: Ambulatory Visit

## 2021-01-04 ENCOUNTER — Ambulatory Visit: Payer: 59 | Admitting: Nurse Practitioner

## 2021-01-10 NOTE — Progress Notes (Signed)
BP 109/63   Pulse (!) 127   Temp (!) 97 F (36.1 C)   SpO2 99%    Subjective:    Patient ID: Angela Mullen, female    DOB: April 26, 1956, 65 y.o.   MRN: [id number]  HPI: Angela Mullen is a 65 y.o. female  Chief Complaint  Patient presents with  . Depression  . Hypertension    Patient here today to follow up on Depression/Hypertension and anxiety.  Patient recently saw her psychiatrist and has decided to stop taking the Vyvanse and her depression/anxiety medications will be managed here.   DEPRESSION/ANXIETY Patient feels more at peace about having her medications managed here.  She has stopped taking the Vyvanse on 12/10/2020. States she has been sleeping better since stopping the medication.  Will meet with your Psychiatrist one last time and tell her that she has stopped the Vyvanse and her medications will now be managed here. Patient denies SI.  HYPERTENSION / HYPERLIPIDEMIA Satisfied with current treatment?yes Duration of hypertension:chronic BP monitoring frequency:not checking BP medication side effects:no Past BP meds:atenolol, lisinopril, amlodipine Duration of hyperlipidemia:chronic Cholesterol medication side effects:no Cholesterol supplements: none Past cholesterol medications:lovastatin Medication compliance:excellent compliance Aspirin:no Recent stressors:no Recurrent headaches:no Visual changes:no Palpitations:no Dyspnea:no Chest pain:no Lower extremity edema:no Dizzy/lightheaded:no Relevant past medical, surgical, family and social history reviewed and updated as indicated. Interim medical history since our last visit reviewed. Allergies and medications reviewed and updated.  Review of Systems  Eyes: Negative for visual disturbance.  Respiratory: Negative for cough, chest tightness and shortness of breath.   Cardiovascular: Negative for chest pain, palpitations and leg swelling.  Neurological: Negative for dizziness and  headaches.  Psychiatric/Behavioral: Positive for dysphoric mood and sleep disturbance. Negative for suicidal ideas. The patient is nervous/anxious.     Per HPI unless specifically indicated above     Objective:    BP 109/63   Pulse (!) 127   Temp (!) 97 F (36.1 C)   SpO2 99%   Wt Readings from Last 3 Encounters:  12/02/20 242 lb 6 oz (109.9 kg)  11/01/20 236 lb (107 kg)  10/22/20 244 lb (110.7 kg)    Physical Exam Vitals and nursing note reviewed.  Constitutional:      General: She is not in acute distress.    Appearance: Normal appearance. She is obese. She is not ill-appearing, toxic-appearing or diaphoretic.  HENT:     Head: Normocephalic.     Right Ear: External ear normal.     Left Ear: External ear normal.     Nose: Nose normal.     Mouth/Throat:     Mouth: Mucous membranes are moist.     Pharynx: Oropharynx is clear.  Eyes:     General:        Right eye: No discharge.        Left eye: No discharge.     Extraocular Movements: Extraocular movements intact.     Conjunctiva/sclera: Conjunctivae normal.     Pupils: Pupils are equal, round, and reactive to light.  Cardiovascular:     Rate and Rhythm: Normal rate and regular rhythm.     Heart sounds: No murmur heard.   Pulmonary:     Effort: Pulmonary effort is normal. No respiratory distress.     Breath sounds: Normal breath sounds. No wheezing or rales.  Musculoskeletal:     Cervical back: Normal range of motion and neck supple.  Skin:    General: Skin is warm and dry.  Capillary Refill: Capillary refill takes less than 2 seconds.  Neurological:     General: No focal deficit present.     Mental Status: She is alert and oriented to person, place, and time. Mental status is at baseline.  Psychiatric:        Mood and Affect: Mood normal.        Behavior: Behavior normal.        Thought Content: Thought content normal.        Judgment: Judgment normal.     Results for orders placed or performed in  visit on 12/09/20  Microscopic Examination   Urine  Result Value Ref Range   WBC, UA 0-5 0 - 5 /hpf   RBC None seen 0 - 2 /hpf   Epithelial Cells (non renal) 0-10 0 - 10 /hpf   Mucus, UA Present (A) Not Estab.   Bacteria, UA None seen None seen/Few  Comp Met (CMET)  Result Value Ref Range   Glucose 90 65 - 99 mg/dL   BUN 14 8 - 27 mg/dL   Creatinine, Ser 1.53 (H) 0.57 - 1.00 mg/dL   eGFR 38 (L) >59 mL/min/1.73   BUN/Creatinine Ratio 9 (L) 12 - 28   Sodium 147 (H) 134 - 144 mmol/L   Potassium 4.3 3.5 - 5.2 mmol/L   Chloride 112 (H) 96 - 106 mmol/L   CO2 19 (L) 20 - 29 mmol/L   Calcium 9.0 8.7 - 10.3 mg/dL   Total Protein 6.3 6.0 - 8.5 g/dL   Albumin 4.1 3.8 - 4.8 g/dL   Globulin, Total 2.2 1.5 - 4.5 g/dL   Albumin/Globulin Ratio 1.9 1.2 - 2.2   Bilirubin Total 0.3 0.0 - 1.2 mg/dL   Alkaline Phosphatase 112 44 - 121 IU/L   AST 23 0 - 40 IU/L   ALT 27 0 - 32 IU/L  Urinalysis, Routine w reflex microscopic  Result Value Ref Range   Specific Gravity, UA >1.030 (H) 1.005 - 1.030   pH, UA 6.0 5.0 - 7.5   Color, UA Yellow Yellow   Appearance Ur Cloudy (A) Clear   Leukocytes,UA 1+ (A) Negative   Protein,UA 1+ (A) Negative/Trace   Glucose, UA Negative Negative   Ketones, UA Negative Negative   RBC, UA Negative Negative   Bilirubin, UA Negative Negative   Urobilinogen, Ur 0.2 0.2 - 1.0 mg/dL   Nitrite, UA Negative Negative   Microscopic Examination See below:       Assessment & Plan:   Problem List Items Addressed This Visit      Cardiovascular and Mediastinum   Essential (primary) hypertension    Chronic.  Well controlled on current medication regimen.  Labs drawn at last visit. Follow up in 6 weeks.        Genitourinary   Chronic kidney disease (CKD), stage III (moderate) (HCC)    Chronic.  Referral placed nephrology further management of CKD.  Reviewed patient's labs with her during visit today.       Relevant Orders   Ambulatory referral to Nephrology     Other    Depression, major, single episode, in partial remission ([place]) - Primary    Chronic.  Better controlled. Will have medications managed in this office moving forward.  Follow up in one month after seeing psychiatrist for final visit.       Insomnia, persistent    Chronic.  Improving now that patient is no longer taking Vyvanse. Discussed with patient that Wellbutrin can also cause  insomnia.  May benefit from taking both doses in the morning.  Follow up in one month.      Generalized anxiety disorder    Chronic.  Better controlled. Will have medications managed in this office moving forward.  Follow up in one month after seeing psychiatrist for final visit.           Follow up plan: Return in about 6 weeks (around 02/22/2021) for Depression/Anxiety FU.  A total of 30 minutes were spent on this encounter today.  When total time is documented, this includes both the face-to-face and non-face-to-face time personally spent before, during and after the visit on the date of the encounter.

## 2021-01-11 ENCOUNTER — Ambulatory Visit (INDEPENDENT_AMBULATORY_CARE_PROVIDER_SITE_OTHER): Payer: 59 | Admitting: Nurse Practitioner

## 2021-01-11 ENCOUNTER — Other Ambulatory Visit: Payer: Self-pay

## 2021-01-11 ENCOUNTER — Encounter: Payer: Self-pay | Admitting: Nurse Practitioner

## 2021-01-11 VITALS — BP 109/63 | HR 127 | Temp 97.0°F

## 2021-01-11 DIAGNOSIS — I10 Essential (primary) hypertension: Secondary | ICD-10-CM

## 2021-01-11 DIAGNOSIS — N1832 Chronic kidney disease, stage 3b: Secondary | ICD-10-CM

## 2021-01-11 DIAGNOSIS — G4700 Insomnia, unspecified: Secondary | ICD-10-CM

## 2021-01-11 DIAGNOSIS — F324 Major depressive disorder, single episode, in partial remission: Secondary | ICD-10-CM | POA: Diagnosis not present

## 2021-01-11 DIAGNOSIS — F411 Generalized anxiety disorder: Secondary | ICD-10-CM

## 2021-01-11 NOTE — Assessment & Plan Note (Signed)
Chronic.  Better controlled. Will have medications managed in this office moving forward.  Follow up in one month after seeing psychiatrist for final visit.

## 2021-01-11 NOTE — Assessment & Plan Note (Signed)
Chronic.  Improving now that patient is no longer taking Vyvanse. Discussed with patient that Wellbutrin can also cause insomnia.  May benefit from taking both doses in the morning.  Follow up in one month.

## 2021-01-11 NOTE — Patient Instructions (Addendum)
Retina Exam May 25.

## 2021-01-11 NOTE — Assessment & Plan Note (Signed)
Chronic.  Referral placed nephrology further management of CKD.  Reviewed patient's labs with her during visit today.

## 2021-01-11 NOTE — Assessment & Plan Note (Signed)
Chronic.  Well controlled on current medication regimen.  Labs drawn at last visit. Follow up in 6 weeks.

## 2021-01-25 ENCOUNTER — Ambulatory Visit: Payer: 59

## 2021-02-10 ENCOUNTER — Other Ambulatory Visit: Payer: Self-pay | Admitting: Family Medicine

## 2021-02-10 NOTE — Telephone Encounter (Signed)
Requested medication (s) are due for refill today: yes  Requested medication (s) are on the active medication list: yes  Last refill:  11/01/20 #180  Future visit scheduled: yes  Notes to clinic:  Please review for refill. Refill not delegated per protocol    Requested Prescriptions  Pending Prescriptions Disp Refills   pregabalin (LYRICA) 150 MG capsule [Pharmacy Med Name: Pregabalin 150 MG Oral Capsule] 180 capsule 0    Sig: Take 2 capsules by mouth once daily      Not Delegated - Neurology:  Anticonvulsants - Controlled Failed - 02/10/2021  9:11 AM      Failed - This refill cannot be delegated      Passed - Valid encounter within last 12 months    Recent Outpatient Visits           1 month ago Depression, major, single episode, in partial remission ([place])   [place], Karen, NP   2 months ago Annual physical exam   [place], NP   3 months ago Essential (primary) hypertension   [employer], [place], DO   10 months ago Essential (primary) hypertension   [facility], [place]   11 months ago [place], Lilia Argue, [place]       Future Appointments             In 2 weeks Gollan, Kathlene November, MD [facility], [place]   In 2 weeks Jon Billings, NP [facility], [place]

## 2021-02-10 NOTE — Telephone Encounter (Signed)
Pt had apt on 01/11/2021 next apt on 03/02/2021

## 2021-02-22 ENCOUNTER — Other Ambulatory Visit: Payer: Self-pay | Admitting: Family Medicine

## 2021-02-22 MED ORDER — TRAZODONE HCL 100 MG PO TABS: 100.0000 mg | ORAL_TABLET | Freq: Every evening | ORAL | 1 refills | Status: DC | PRN

## 2021-02-22 MED ORDER — OZEMPIC (0.25 OR 0.5 MG/DOSE) 2 MG/1.5ML ~~LOC~~ SOPN
0.2500 mg | PEN_INJECTOR | SUBCUTANEOUS | 0 refills | Status: DC
Start: 2021-02-22 — End: 2021-05-25

## 2021-02-22 NOTE — Progress Notes (Deleted)
Virtual Visit via Video Note   This visit type was conducted due to national recommendations for restrictions regarding the COVID-19 Pandemic (e.g. social distancing) in an effort to limit this patient's exposure and mitigate transmission in our community.  Due to her co-morbid illnesses, this patient is at least at moderate risk for complications without adequate follow up.  This format is felt to be most appropriate for this patient at this time.  All issues noted in this document were discussed and addressed.  A limited physical exam was performed with this format.  Please refer to the patient's chart for her consent to telehealth for [facility].   I connected with  Angela Mullen on 02/22/21 by a video enabled telemedicine application and verified that I am speaking with the correct person using two identifiers. I discussed the limitations of evaluation and management by telemedicine. The patient expressed understanding and agreed to proceed.   Evaluation Performed:  Follow-up visit  Date:  02/22/2021   ID:  Angela Mullen, DOB 1956/07/03, MRN [id number]  Patient Location:  [place] [address]   Provider location:   [facility], [place] office  PCP:  Jon Billings, NP  Cardiologist:  [facility]   Chief Complaint:      History of Present Illness:    Angela Mullen is a 65 y.o. female who presents via audio/video conferencing for a telehealth visit today.   The patient does not symptoms concerning for COVID-19 infection (fever, chills, cough, or new SHORTNESS OF BREATH).   Patient has a past medical history of essential hypertension,  hyperlipidemia type 2 diabetes.  Cardiac catheterization 07/15/2014 revealed normal coronary anatomy and normal left ventricular function Fatty liver Type 2 diabetes COPD Morbid obesity Chronic shortness of breath Chronic lower extremity edema Gastric bypass surgery Who  presents for follow-up of her hypertension and hyperlipidemia  BP stable 120-130/70 [place]   Weight up 10-15 pounds Previous gastric bypass surgery with over 100 pound weight loss  Drinking sweet tea, carbs  No blood pressure cuff at home  Has a therapist/psychiatry  Labs reviewed HBA1C 5.8 Total chol 169, LDL 88 CR 1.26    Prior CV studies:   The following studies were reviewed today:   Echocardiogram July 20 50,018 showing normal LV function mild LVH normal diastolic function mild aortic valve stenosis peak gradient 28 mmHg mean gradient 13 mmHg   Past Medical History:  Diagnosis Date  . Abnormal liver enzymes 09/03/2014  . Acid reflux   . Allergy   . Anemia    H/O  . Anxiety   . Arthritis   . Cataract   . COPD (chronic obstructive pulmonary disease) ([place])   . Depression   . Diabetes mellitus without complication ([place])   . Dyspnea    WITH EXERTION DUE TO COPD  . Elevation of level of transaminase or lactic acid dehydrogenase (LDH) 08/27/2012  . Fatty liver   . Headache    H/O MIGRAINES  . High blood pressure   . High cholesterol   . Sleep apnea    BIPAP   Past Surgical History:  Procedure Laterality Date  . ABDOMINAL HYSTERECTOMY    . BARIATRIC SURGERY  06/15/2017  . CARDIAC CATHETERIZATION    . COLONOSCOPY WITH PROPOFOL N/A 10/10/2018   Procedure: COLONOSCOPY WITH PROPOFOL;  Surgeon: Jonathon Bellows, MD;  Location: [facility] ENDOSCOPY;  Service: Gastroenterology;  Laterality: N/A;  . FRACTURE SURGERY  1990   RIGHT LEG   . KNEE SURGERY    . REPLACEMENT TOTAL KNEE Left   . TONSILLECTOMY    . TOTAL KNEE ARTHROPLASTY Left 04/28/2020   Procedure: TOTAL KNEE ARTHROPLASTY;  Surgeon: Lovell Sheehan, MD;  Location: [facility] ORS;  Service: Orthopedics;  Laterality: Left;  . TUBAL LIGATION        Allergies:   Ketorolac, Prednisone, and Nsaids   Social History   Tobacco Use  . Smoking status: Never Smoker  . Smokeless tobacco: Never Used  Vaping Use  . Vaping Use:  Never used  Substance Use Topics  . Alcohol use: Yes    Comment: occassional  . Drug use: No     Current Outpatient Medications on File Prior to Visit  Medication Sig Dispense Refill  . acetaminophen (TYLENOL) 500 MG tablet Take 1,000 mg by mouth every 6 (six) hours as needed for moderate pain.     Marland Kitchen ADVAIR HFA 115-21 MCG/ACT inhaler INHALE 2 PUFFS BY MOUTH EVERY 12 HOURS 3 Inhaler 3  . albuterol (PROVENTIL) (2.5 MG/3ML) 0.083% nebulizer solution Take 3 mLs (2.5 mg total) by nebulization every 6 (six) hours as needed for wheezing or shortness of breath. 270 mL 3  . albuterol (VENTOLIN HFA) 108 (90 Base) MCG/ACT inhaler Inhale 1-2 puffs into the lungs every 6 (six) hours as needed for wheezing or shortness of breath. 54 g 3  . amLODipine (NORVASC) 5 MG tablet Take 1 tablet (5 mg total) by mouth daily. 90 tablet 1  . atenolol (TENORMIN) 50 MG tablet Take 50 mg by mouth 2 (two) times daily. Take 1 tablet (36m) in the morning and 2 tablets (1057m in the evening.    . Marland KitchenuPROPion (WELLBUTRIN SR) 150 MG 12 hr tablet Take 150 mg by mouth 2 (two) times daily.     . clotrimazole-betamethasone (LOTRISONE) cream Apply 1 application topically 2 (two) times daily as needed.    . DULoxetine (CYMBALTA) 30 MG capsule Take 30 mg by mouth at bedtime.     . DULoxetine (CYMBALTA) 60 MG capsule Take 60 mg by mouth at bedtime.     . fluticasone-salmeterol (ADVAIR HFA) 115-21 MCG/ACT inhaler Inhale 2 puffs into the lungs 2 (two) times daily as needed.    . Marland Kitchenisinopril (ZESTRIL) 20 MG tablet Take 1 tablet by mouth once daily 90 tablet 0  . lovastatin (MEVACOR) 40 MG tablet Take 1 tablet (40 mg total) by mouth at bedtime. 90 tablet 3  . methocarbamol (ROBAXIN) 750 MG tablet TAKE 1 TABLET (750 MG TOTAL) BY MOUTH EVERY 8 (EIGHT) HOURS AS NEEDED. 90 tablet 0  . omeprazole (PRILOSEC) 40 MG capsule Take 40 mg by mouth daily. (Patient not taking: Reported on 01/11/2021)    . pregabalin (LYRICA) 150 MG capsule Take 2 capsules  by mouth once daily 180 capsule 0  . Semaglutide,0.25 or 0.5MG/DOS, (OZEMPIC, 0.25 OR 0.5 MG/DOSE,) 2 MG/1.5ML SOPN Inject 0.25 mg into the skin once a week. 3 mL 0  . tiotropium (SPIRIVA) 18 MCG inhalation capsule Place 18 mcg into inhaler and inhale daily as needed.    . traZODone (DESYREL) 100 MG tablet Take 100 mg by mouth at bedtime as needed for sleep.    . Marland KitchenYVANSE 30 MG capsule Take 30 mg by mouth every morning.      No current facility-administered medications on file prior to visit.     Family Hx: The patient's family history includes Cerebral aneurysm in her mother; Depression in her sister;  Diabetes in her brother, father, and paternal grandfather; Heart attack in her maternal grandfather and sister; Heart disease in her paternal grandfather; High blood pressure in her brother; Hyperlipidemia in her paternal grandfather and son; Hypertension in her sister; Kidney disease in her sister; Obesity in her sister; Stroke in her father, maternal grandmother, and sister.  ROS:   Please see the history of present illness.    Review of Systems  Constitutional: Negative.        Weight increase  HENT: Negative.   Respiratory: Negative.   Cardiovascular: Negative.   Gastrointestinal: Negative.   Musculoskeletal: Negative.   Neurological: Negative.   Psychiatric/Behavioral: Negative.   All other systems reviewed and are negative.     Labs/Other Tests and Data Reviewed:    Recent Labs: 12/02/2020: Hemoglobin 13.3; Platelets 189; TSH 1.210 12/09/2020: ALT 27; BUN 14; Creatinine, Ser 1.53; Potassium 4.3; Sodium 147   Recent Lipid Panel Lab Results  Component Value Date/Time   CHOL 144 12/02/2020 11:11 AM   TRIG 109 12/02/2020 11:11 AM   HDL 60 12/02/2020 11:11 AM   CHOLHDL 2.4 12/02/2020 11:11 AM   LDLCALC 64 12/02/2020 11:11 AM    Wt Readings from Last 3 Encounters:  12/02/20 242 lb 6 oz (109.9 kg)  11/01/20 236 lb (107 kg)  10/22/20 244 lb (110.7 kg)     Exam:    Vital  Signs: Vital signs may also be detailed in the HPI There were no vitals taken for this visit.  Wt Readings from Last 3 Encounters:  12/02/20 242 lb 6 oz (109.9 kg)  11/01/20 236 lb (107 kg)  10/22/20 244 lb (110.7 kg)   Temp Readings from Last 3 Encounters:  01/11/21 (!) 97 F (36.1 C)  12/02/20 98 F (36.7 C)  04/29/20 (!) 97.5 F (36.4 C) (Oral)   BP Readings from Last 3 Encounters:  01/11/21 109/63  12/02/20 97/60  11/01/20 111/74   Pulse Readings from Last 3 Encounters:  01/11/21 (!) 127  12/02/20 63  11/01/20 60     Well nourished, well developed female in no acute distress. Constitutional:  oriented to person, place, and time. No distress.    ASSESSMENT & PLAN:    Problem List Items Addressed This Visit   None    Type 2 diabetes mellitus with complication, unspecified whether long term insulin use ([place]) - Plan: EKG 12-Lead  avoid sweet tea, snacks Follow a low carbohydrate diet HBA1C 5.8  Morbid (severe) obesity due to excess calories (HCC) Dramatic weight loss after gastric bypass surgery over 100 pounds Weight up >10 pounds past 6 months Need knee replacement  NASH (nonalcoholic steatohepatitis) Normal LFTs Recommend continued weight loss  Mixed hyperlipidemia - Relatively well-controlled numbers LDL 88 Continue statin  Essential (primary) hypertension  Blood pressure is well controlled on today's visit. No changes made to the medications.  Chronic kidney disease (CKD), stage III (moderate) (HCC) Stable,  For worsening numbers could wean down on the HCTZ   COVID-19 Education: The signs and symptoms of COVID-19 were discussed with the patient and how to seek care for testing (follow up with PCP or arrange E-visit).  The importance of social distancing was discussed today.  Patient Risk:   After full review of this patients clinical status, I feel that they are at least moderate risk at this time.  Time:   Today, I have spent 25 minutes  with the patient with telehealth technology discussing the cardiac and medical problems/diagnoses detailed above  Additional 10 min spent reviewing the chart prior to patient visit today   Medication Adjustments/Labs and Tests Ordered: Current medicines are reviewed at length with the patient today.  Concerns regarding medicines are outlined above.   Tests Ordered: No tests ordered   Medication Changes: No changes made   Disposition: Follow-up in 12 months   Signed, Ida Rogue, MD  [place] Office [address] [place] [address], [place], [place] [address]

## 2021-02-23 ENCOUNTER — Other Ambulatory Visit: Payer: Self-pay | Admitting: Nephrology

## 2021-02-23 ENCOUNTER — Other Ambulatory Visit (HOSPITAL_COMMUNITY): Payer: Self-pay | Admitting: Nephrology

## 2021-02-23 DIAGNOSIS — R8290 Unspecified abnormal findings in urine: Secondary | ICD-10-CM

## 2021-02-23 DIAGNOSIS — N1832 Chronic kidney disease, stage 3b: Secondary | ICD-10-CM

## 2021-02-24 ENCOUNTER — Ambulatory Visit: Payer: 59 | Admitting: Cardiovascular Disease

## 2021-03-02 ENCOUNTER — Ambulatory Visit: Payer: 59 | Admitting: Nurse Practitioner

## 2021-03-02 DIAGNOSIS — F339 Major depressive disorder, recurrent, unspecified: Secondary | ICD-10-CM | POA: Insufficient documentation

## 2021-03-02 NOTE — Progress Notes (Deleted)
There were no vitals taken for this visit.   Subjective:    Patient ID: Angela Mullen, female    DOB: 1956-05-14, 65 y.o.   MRN: [id number]  HPI: Angela Mullen is a 65 y.o. female  No chief complaint on file.  DEPRESSION/ANXIETY Mood status: {Blank single:19197::"controlled","uncontrolled","better","worse","exacerbated","stable"} Satisfied with current treatment?: {Blank single:19197::"yes","no"} Symptom severity: {Blank single:19197::"mild","moderate","severe"}  Duration of current treatment : {Blank single:19197::"chronic","months","years"} Side effects: {Blank single:19197::"yes","no"} Medication compliance: {Blank single:19197::"excellent compliance","good compliance","fair compliance","poor compliance"} Psychotherapy/counseling: {Blank single:19197::"yes","no"} {Blank single:19197::"current","in the past"} Previous psychiatric medications: {Blank multiple:19196::"abilify","amitryptiline","buspar","celexa","cymbalta","depakote","effexor","lamictal","lexapro","lithium","nortryptiline","paxil","prozac","pristiq (desvenlafaxine","seroquel","wellbutrin","zoloft","zyprexa"} Depressed mood: {Blank single:19197::"yes","no"} Anxious mood: {Blank single:19197::"yes","no"} Anhedonia: {Blank single:19197::"yes","no"} Significant weight loss or gain: {Blank single:19197::"yes","no"} Insomnia: {Blank single:19197::"yes","no"} {Blank single:19197::"hard to fall asleep","hard to stay asleep"} Fatigue: {Blank single:19197::"yes","no"} Feelings of worthlessness or guilt: {Blank single:19197::"yes","no"} Impaired concentration/indecisiveness: {Blank single:19197::"yes","no"} Suicidal ideations: {Blank single:19197::"yes","no"} Hopelessness: {Blank single:19197::"yes","no"} Crying spells: {Blank single:19197::"yes","no"} Depression screen [facility] 2/9 01/11/2021 12/02/2020 11/01/2020 04/08/2020 03/01/2020  Decreased Interest 0 0 1 0 1  Down, Depressed, Hopeless 0 0 1 1 1   PHQ - 2 Score 0 0 2 1 2    Altered sleeping 0 - 1 0 -  Tired, decreased energy 2 - 3 1 -  Change in appetite 0 - 1 1 -  Feeling bad or failure about yourself  0 - 1 1 -  Trouble concentrating 0 - 1 0 -  Moving slowly or fidgety/restless 0 - 1 0 -  Suicidal thoughts 0 - 0 0 -  PHQ-9 Score 2 - 10 4 -  Difficult doing work/chores Not difficult at all - Somewhat difficult - -    Relevant past medical, surgical, family and social history reviewed and updated as indicated. Interim medical history since our last visit reviewed. Allergies and medications reviewed and updated.  Review of Systems  Per HPI unless specifically indicated above     Objective:    There were no vitals taken for this visit.  Wt Readings from Last 3 Encounters:  12/02/20 242 lb 6 oz (109.9 kg)  11/01/20 236 lb (107 kg)  10/22/20 244 lb (110.7 kg)    Physical Exam  Results for orders placed or performed in visit on 12/09/20  Microscopic Examination   Urine  Result Value Ref Range   WBC, UA 0-5 0 - 5 /hpf   RBC None seen 0 - 2 /hpf   Epithelial Cells (non renal) 0-10 0 - 10 /hpf   Mucus, UA Present (A) Not Estab.   Bacteria, UA None seen None seen/Few  Comp Met (CMET)  Result Value Ref Range   Glucose 90 65 - 99 mg/dL   BUN 14 8 - 27 mg/dL   Creatinine, Ser 1.53 (H) 0.57 - 1.00 mg/dL   eGFR 38 (L) >59 mL/min/1.73   BUN/Creatinine Ratio 9 (L) 12 - 28   Sodium 147 (H) 134 - 144 mmol/L   Potassium 4.3 3.5 - 5.2 mmol/L   Chloride 112 (H) 96 - 106 mmol/L   CO2 19 (L) 20 - 29 mmol/L   Calcium 9.0 8.7 - 10.3 mg/dL   Total Protein 6.3 6.0 - 8.5 g/dL   Albumin 4.1 3.8 - 4.8 g/dL   Globulin, Total 2.2 1.5 - 4.5 g/dL   Albumin/Globulin Ratio 1.9 1.2 - 2.2   Bilirubin Total 0.3 0.0 - 1.2 mg/dL   Alkaline Phosphatase 112 44 - 121 IU/L   AST 23 0 - 40 IU/L   ALT 27 0 - 32 IU/L  Urinalysis, Routine w reflex microscopic  Result  Value Ref Range   Specific Gravity, UA >1.030 (H) 1.005 - 1.030   pH, UA 6.0 5.0 - 7.5   Color, UA Yellow  Yellow   Appearance Ur Cloudy (A) Clear   Leukocytes,UA 1+ (A) Negative   Protein,UA 1+ (A) Negative/Trace   Glucose, UA Negative Negative   Ketones, UA Negative Negative   RBC, UA Negative Negative   Bilirubin, UA Negative Negative   Urobilinogen, Ur 0.2 0.2 - 1.0 mg/dL   Nitrite, UA Negative Negative   Microscopic Examination See below:       Assessment & Plan:   Problem List Items Addressed This Visit      Other   Generalized anxiety disorder - Primary   Depression, recurrent ([place])   RESOLVED: Depression, major, single episode, in partial remission ([place])       Follow up plan: No follow-ups on file.

## 2021-03-10 ENCOUNTER — Ambulatory Visit: Payer: 59 | Admitting: Nurse Practitioner

## 2021-03-12 ENCOUNTER — Other Ambulatory Visit: Payer: Self-pay | Admitting: Family

## 2021-03-16 ENCOUNTER — Ambulatory Visit
Admission: RE | Admit: 2021-03-16 | Discharge: 2021-03-16 | Disposition: A | Discharge status: Home / Self Care | Payer: 59 | Source: Ambulatory Visit | Attending: Nephrology | Admitting: Nephrology

## 2021-03-16 ENCOUNTER — Other Ambulatory Visit: Payer: Self-pay

## 2021-03-16 DIAGNOSIS — N1832 Chronic kidney disease, stage 3b: Secondary | ICD-10-CM

## 2021-03-16 DIAGNOSIS — R8290 Unspecified abnormal findings in urine: Secondary | ICD-10-CM

## 2021-03-22 ENCOUNTER — Ambulatory Visit: Payer: 59 | Admitting: Cardiovascular Disease

## 2021-03-23 ENCOUNTER — Ambulatory Visit: Payer: 59 | Admitting: Nurse Practitioner

## 2021-03-29 ENCOUNTER — Ambulatory Visit: Payer: 59 | Admitting: Cardiovascular Disease

## 2021-03-29 ENCOUNTER — Telehealth: Payer: Self-pay

## 2021-03-29 MED ORDER — CLOTRIMAZOLE-BETAMETHASONE 1-0.05 % EX CREA: 1.0000 "application " | TOPICAL_CREAM | Freq: Two times a day (BID) | CUTANEOUS | 1 refills | Status: DC | PRN

## 2021-03-29 NOTE — Telephone Encounter (Signed)
Pt is scheduled 7/7  Copied from [place] [id number]. Topic: General - Inquiry >> Mar 29, 2021 10:50 AM Greggory Keen D wrote: Reason for CRM: Pt called asking for a refill on the generic Lotrisone cream.  She said she talked to the nurse and provider on her visit in March.  [facility]

## 2021-03-29 NOTE — Telephone Encounter (Signed)
Please let patient know that I sent the refill to the pharmacy.

## 2021-03-29 NOTE — Telephone Encounter (Signed)
Called and LVM letting patient know that the medication was sent in for her.

## 2021-03-31 ENCOUNTER — Ambulatory Visit: Payer: 59 | Admitting: Nurse Practitioner

## 2021-04-04 NOTE — Progress Notes (Signed)
BP (!) 105/58   Pulse 63   Temp 98 F (36.7 C)   Wt 231 lb 9.6 oz (105.1 kg)   SpO2 95%   BMI 38.59 kg/m    Subjective:    Patient ID: Angela Mullen, female    DOB: 04-Jan-1956, 65 y.o.   MRN: [id number]  HPI: Angela Mullen is a 65 y.o. female  Chief Complaint  Patient presents with   Depression   Anxiety   ANXIETY/DEPRESSION Patient states her depression and anxiety are well controlled.  She is feeling great with her mood. She continues to see her therapist which is beneficial.  Denies concerns related to her anxiety/depression today.  Denies SI.  CHRONIC KIDNEY DISEASE Patient has been seeing the nephrologist for CKD. They discussed weight loss options to help control diabetes and CKD. Patient has changed her diet and lost some weight.    Denies HA, CP, SOB, dizziness, palpitations, visual changes, and lower extremity swelling. Denies polyuria and polydipsia.  Relevant past medical, surgical, family and social history reviewed and updated as indicated. Interim medical history since our last visit reviewed. Allergies and medications reviewed and updated.  Review of Systems  Eyes:  Negative for visual disturbance.  Respiratory:  Negative for cough, chest tightness and shortness of breath.   Cardiovascular:  Negative for chest pain, palpitations and leg swelling.  Endocrine: Negative for polydipsia and polyuria.  Neurological:  Negative for dizziness and headaches.   Per HPI unless specifically indicated above     Objective:    BP (!) 105/58   Pulse 63   Temp 98 F (36.7 C)   Wt 231 lb 9.6 oz (105.1 kg)   SpO2 95%   BMI 38.59 kg/m   Wt Readings from Last 3 Encounters:  04/05/21 231 lb 9.6 oz (105.1 kg)  12/02/20 242 lb 6 oz (109.9 kg)  11/01/20 236 lb (107 kg)    Physical Exam Vitals and nursing note reviewed.  Constitutional:      General: She is not in acute distress.    Appearance: Normal appearance. She is obese. She is not ill-appearing,  toxic-appearing or diaphoretic.  HENT:     Head: Normocephalic.     Right Ear: External ear normal.     Left Ear: External ear normal.     Nose: Nose normal.     Mouth/Throat:     Mouth: Mucous membranes are moist.     Pharynx: Oropharynx is clear.  Eyes:     General:        Right eye: No discharge.        Left eye: No discharge.     Extraocular Movements: Extraocular movements intact.     Conjunctiva/sclera: Conjunctivae normal.     Pupils: Pupils are equal, round, and reactive to light.  Cardiovascular:     Rate and Rhythm: Normal rate and regular rhythm.     Heart sounds: No murmur heard. Pulmonary:     Effort: Pulmonary effort is normal. No respiratory distress.     Breath sounds: Normal breath sounds. No wheezing or rales.  Musculoskeletal:     Cervical back: Normal range of motion and neck supple.  Skin:    General: Skin is warm and dry.     Capillary Refill: Capillary refill takes less than 2 seconds.  Neurological:     General: No focal deficit present.     Mental Status: She is alert and oriented to person, place, and time. Mental status is at  baseline.  Psychiatric:        Mood and Affect: Mood normal.        Behavior: Behavior normal.        Thought Content: Thought content normal.        Judgment: Judgment normal.    Results for orders placed or performed in visit on 12/09/20  Microscopic Examination   Urine  Result Value Ref Range   WBC, UA 0-5 0 - 5 /hpf   RBC None seen 0 - 2 /hpf   Epithelial Cells (non renal) 0-10 0 - 10 /hpf   Mucus, UA Present (A) Not Estab.   Bacteria, UA None seen None seen/Few  Comp Met (CMET)  Result Value Ref Range   Glucose 90 65 - 99 mg/dL   BUN 14 8 - 27 mg/dL   Creatinine, Ser 1.53 (H) 0.57 - 1.00 mg/dL   eGFR 38 (L) >59 mL/min/1.73   BUN/Creatinine Ratio 9 (L) 12 - 28   Sodium 147 (H) 134 - 144 mmol/L   Potassium 4.3 3.5 - 5.2 mmol/L   Chloride 112 (H) 96 - 106 mmol/L   CO2 19 (L) 20 - 29 mmol/L   Calcium 9.0 8.7 -  10.3 mg/dL   Total Protein 6.3 6.0 - 8.5 g/dL   Albumin 4.1 3.8 - 4.8 g/dL   Globulin, Total 2.2 1.5 - 4.5 g/dL   Albumin/Globulin Ratio 1.9 1.2 - 2.2   Bilirubin Total 0.3 0.0 - 1.2 mg/dL   Alkaline Phosphatase 112 44 - 121 IU/L   AST 23 0 - 40 IU/L   ALT 27 0 - 32 IU/L  Urinalysis, Routine w reflex microscopic  Result Value Ref Range   Specific Gravity, UA >1.030 (H) 1.005 - 1.030   pH, UA 6.0 5.0 - 7.5   Color, UA Yellow Yellow   Appearance Ur Cloudy (A) Clear   Leukocytes,UA 1+ (A) Negative   Protein,UA 1+ (A) Negative/Trace   Glucose, UA Negative Negative   Ketones, UA Negative Negative   RBC, UA Negative Negative   Bilirubin, UA Negative Negative   Urobilinogen, Ur 0.2 0.2 - 1.0 mg/dL   Nitrite, UA Negative Negative   Microscopic Examination See below:       Assessment & Plan:   Problem List Items Addressed This Visit       Cardiovascular and Mediastinum   Essential (primary) hypertension    Chronic.  Controlled.  Continue with current medication regimen.  Labs ordered today.  Return to clinic in 3 months for reevaluation.  Call sooner if concerns arise.         Relevant Orders   Comp Met (CMET)   HgB A1c     Digestive   GERD (gastroesophageal reflux disease)    Chronic.  Controlled.  Refills sent today.        Relevant Medications   omeprazole (PRILOSEC) 40 MG capsule     Endocrine   DM (diabetes mellitus), type 2 with complications (HCC)    Chronic.  Controlled.  Continue with current medication regimen.  Labs ordered today.  Return to clinic in 3 months for reevaluation.  Call sooner if concerns arise. Patient has lost 10lbs since March and praised for change in diet.  Discussed ongoing diet and exercise to aid in weight loss.         Relevant Orders   Comp Met (CMET)   HgB A1c     Genitourinary   Chronic kidney disease (CKD), stage III (moderate) ([place])  Chronic.  Labs ordered today.  Following up with Nephrology for ongoing management in  kidney disease. Continue with weight loss and continue with recommendations per nephrology.          Other   Generalized anxiety disorder - Primary    Chronic.  Controlled.  Continue with current medication regimen.  Labs ordered today.  Return to clinic in 3 months for reevaluation.  Call sooner if concerns arise.         Morbid obesity with body mass index of 40.0-49.9 (HCC)    Praised for recent weight loss.  Encouraged to continue lifestyle changes.         Depression, recurrent (HCC)    Chronic.  Controlled.  Continue with current medication regimen.  Labs ordered today.  Return to clinic in 3 months for reevaluation.  Call sooner if concerns arise.           Follow up plan: Return in about 3 months (around 07/06/2021) for Depression/Anxiety FU, HTN, HLD, DM2 FU.   A total of 40 minutes were spent on this encounter today.  When total time is documented, this includes both the face-to-face and non-face-to-face time personally spent before, during and after the visit on the date of the encounter on CKD and diet modifications to aid in weight loss.

## 2021-04-05 ENCOUNTER — Other Ambulatory Visit: Payer: Self-pay

## 2021-04-05 ENCOUNTER — Encounter: Payer: Self-pay | Admitting: Nurse Practitioner

## 2021-04-05 ENCOUNTER — Ambulatory Visit: Payer: 59 | Admitting: Nurse Practitioner

## 2021-04-05 VITALS — BP 105/58 | HR 63 | Temp 98.0°F | Wt 231.6 lb

## 2021-04-05 DIAGNOSIS — E118 Type 2 diabetes mellitus with unspecified complications: Secondary | ICD-10-CM

## 2021-04-05 DIAGNOSIS — F411 Generalized anxiety disorder: Secondary | ICD-10-CM | POA: Diagnosis not present

## 2021-04-05 DIAGNOSIS — K219 Gastro-esophageal reflux disease without esophagitis: Secondary | ICD-10-CM

## 2021-04-05 DIAGNOSIS — I10 Essential (primary) hypertension: Secondary | ICD-10-CM | POA: Diagnosis not present

## 2021-04-05 DIAGNOSIS — N1832 Chronic kidney disease, stage 3b: Secondary | ICD-10-CM

## 2021-04-05 DIAGNOSIS — F339 Major depressive disorder, recurrent, unspecified: Secondary | ICD-10-CM

## 2021-04-05 MED ORDER — OMEPRAZOLE 40 MG PO CPDR: 40.0000 mg | DELAYED_RELEASE_CAPSULE | Freq: Every day | ORAL | 1 refills | Status: DC

## 2021-04-05 NOTE — Assessment & Plan Note (Addendum)
Chronic.  Controlled.  Continue with current medication regimen.  Labs ordered today.  Return to clinic in 3 months for reevaluation.  Call sooner if concerns arise.

## 2021-04-05 NOTE — Assessment & Plan Note (Signed)
Chronic.  Controlled.  Continue with current medication regimen.  Labs ordered today.  Return to clinic in 3 months for reevaluation.  Call sooner if concerns arise.

## 2021-04-05 NOTE — Assessment & Plan Note (Signed)
Chronic.  Controlled.  Continue with current medication regimen.  Labs ordered today.  Return to clinic in 3 months for reevaluation.  Call sooner if concerns arise. Patient has lost 10lbs since March and praised for change in diet.  Discussed ongoing diet and exercise to aid in weight loss.

## 2021-04-05 NOTE — Assessment & Plan Note (Signed)
Chronic.  Controlled.  Refills sent today.

## 2021-04-05 NOTE — Assessment & Plan Note (Signed)
Chronic.  Labs ordered today.  Following up with Nephrology for ongoing management in kidney disease. Continue with weight loss and continue with recommendations per nephrology.

## 2021-04-05 NOTE — Assessment & Plan Note (Signed)
Praised for recent weight loss.  Encouraged to continue lifestyle changes.

## 2021-04-06 LAB — COMPREHENSIVE METABOLIC PANEL
ALT: 27 IU/L (ref 0–32)
AST: 23 IU/L (ref 0–40)
Albumin/Globulin Ratio: 2.9 — ABNORMAL HIGH (ref 1.2–2.2)
Albumin: 4.4 g/dL (ref 3.8–4.8)
Alkaline Phosphatase: 97 IU/L (ref 44–121)
BUN/Creatinine Ratio: 9 — ABNORMAL LOW (ref 12–28)
BUN: 15 mg/dL (ref 8–27)
Bilirubin Total: 0.3 mg/dL (ref 0.0–1.2)
CO2: 17 mmol/L — ABNORMAL LOW (ref 20–29)
Calcium: 9.2 mg/dL (ref 8.7–10.3)
Chloride: 107 mmol/L — ABNORMAL HIGH (ref 96–106)
Creatinine, Ser: 1.61 mg/dL — ABNORMAL HIGH (ref 0.57–1.00)
Globulin, Total: 1.5 g/dL (ref 1.5–4.5)
Glucose: 87 mg/dL (ref 65–99)
Potassium: 4.7 mmol/L (ref 3.5–5.2)
Sodium: 145 mmol/L — ABNORMAL HIGH (ref 134–144)
Total Protein: 5.9 g/dL — ABNORMAL LOW (ref 6.0–8.5)
eGFR: 36 mL/min/{1.73_m2} — ABNORMAL LOW (ref >59)

## 2021-04-06 LAB — HEMOGLOBIN A1C
Est. average glucose Bld gHb Est-mCnc: 108 mg/dL
Hgb A1c MFr Bld: 5.4 % (ref 4.8–5.6)

## 2021-04-06 NOTE — Progress Notes (Signed)
Hi Angela Mullen.  Your lab work shows that your kidney function has worsened slightly.  Make sure you are hydrating well and avoiding NSAIDS. Your A1c is well controlled.  Keep up the good work and follow up with Nephrology.  We will continue to monitor in the future.

## 2021-04-27 NOTE — Progress Notes (Deleted)
Cardiology Office Note:    Date:  04/27/2021   ID:  Angela Mullen, DOB 1956-01-03, MRN [id number]  PCP:  Jon Billings, NP  [facility] Cardiologist:  Ida Rogue, MD  [facility] Electrophysiologist:  None   Referring MD: Jon Billings, NP   Chief Complaint: 4 month f/u  History of Present Illness:    Angela Mullen is a 65 y.o. female with a hx of ***  Past Medical History:  Diagnosis Date   Abnormal liver enzymes 09/03/2014   Acid reflux    Allergy    Anemia    H/O   Anxiety    Arthritis    Cataract    COPD (chronic obstructive pulmonary disease) ([place])    Depression    Diabetes mellitus without complication ([place])    Dyspnea    WITH EXERTION DUE TO COPD   Elevation of level of transaminase or lactic acid dehydrogenase (LDH) 08/27/2012   Fatty liver    Headache    H/O MIGRAINES   High blood pressure    High cholesterol    Sleep apnea    BIPAP    Past Surgical History:  Procedure Laterality Date   ABDOMINAL HYSTERECTOMY     BARIATRIC SURGERY  06/15/2017   CARDIAC CATHETERIZATION     COLONOSCOPY WITH PROPOFOL N/A 10/10/2018   Procedure: COLONOSCOPY WITH PROPOFOL;  Surgeon: Jonathon Bellows, MD;  Location: [facility] ENDOSCOPY;  Service: Gastroenterology;  Laterality: N/A;   FRACTURE SURGERY  1990   RIGHT LEG    KNEE SURGERY     REPLACEMENT TOTAL KNEE Left    TONSILLECTOMY     TOTAL KNEE ARTHROPLASTY Left 04/28/2020   Procedure: TOTAL KNEE ARTHROPLASTY;  Surgeon: Lovell Sheehan, MD;  Location: [facility] ORS;  Service: Orthopedics;  Laterality: Left;   TUBAL LIGATION      Current Medications: No outpatient medications have been marked as taking for the 04/28/21 encounter (Appointment) with Kathlen Mody, Zoraya Fiorenza H, PA-C.     Allergies:   Ketorolac, Prednisone, and Nsaids   Social History   Socioeconomic History   Marital status: Married    Spouse name: Not on file   Number of children: Not on file   Years of education: Not on file   Highest education  level: Not on file  Occupational History   Not on file  Tobacco Use   Smoking status: Never   Smokeless tobacco: Never  Vaping Use   Vaping Use: Never used  Substance and Sexual Activity   Alcohol use: Yes    Comment: occassional   Drug use: No   Sexual activity: Not Currently  Other Topics Concern   Not on file  Social History Narrative   Not on file   Social Determinants of Health   Financial Resource Strain: Not on file  Food Insecurity: Not on file  Transportation Needs: Not on file  Physical Activity: Not on file  Stress: Not on file  Social Connections: Not on file     Family History: The patient's ***family history includes Cerebral aneurysm in her mother; Depression in her sister; Diabetes in her brother, father, and paternal grandfather; Heart attack in her maternal grandfather and sister; Heart disease in her paternal grandfather; High blood pressure in her brother; Hyperlipidemia in her paternal grandfather and son; Hypertension in her sister; Kidney disease in her sister; Obesity in her sister; Stroke in her father, maternal grandmother, and sister.  ROS:   Please see the history of present illness.    ***  All other systems reviewed and are negative.  EKGs/Labs/Other Studies Reviewed:    The following studies were reviewed today: ***  EKG:  EKG is *** ordered today.  The ekg ordered today demonstrates ***  Recent Labs: 12/02/2020: Hemoglobin 13.3; Platelets 189; TSH 1.210 04/05/2021: ALT 27; BUN 15; Creatinine, Ser 1.61; Potassium 4.7; Sodium 145  Recent Lipid Panel    Component Value Date/Time   CHOL 144 12/02/2020 1111   TRIG 109 12/02/2020 1111   HDL 60 12/02/2020 1111   CHOLHDL 2.4 12/02/2020 1111   LDLCALC 64 12/02/2020 1111     Risk Assessment/Calculations:   {Does this patient have ATRIAL FIBRILLATION?[phone]}   Physical Exam:    VS:  There were no vitals taken for this visit.    Wt Readings from Last 3 Encounters:  04/05/21 231 lb  9.6 oz (105.1 kg)  12/02/20 242 lb 6 oz (109.9 kg)  11/01/20 236 lb (107 kg)     GEN: *** Well nourished, well developed in no acute distress HEENT: Normal NECK: No JVD; No carotid bruits LYMPHATICS: No lymphadenopathy CARDIAC: ***RRR, no murmurs, rubs, gallops RESPIRATORY:  Clear to auscultation without rales, wheezing or rhonchi  ABDOMEN: Soft, non-tender, non-distended MUSCULOSKELETAL:  No edema; No deformity  SKIN: Warm and dry NEUROLOGIC:  Alert and oriented x 3 PSYCHIATRIC:  Normal affect   ASSESSMENT:    No diagnosis found. PLAN:    In order of problems listed above:  ***  Disposition: Follow up {follow up:15908} with ***   Shared Decision Making/Informed Consent   {Are you ordering a CV Procedure (e.g. stress test, cath, DCCV, TEE, etc)?   Press F2        :[id number]}    Signed, Jaydrien Wassenaar Ninfa Meeker, PA-C  04/27/2021 1:35 PM     [facility]

## 2021-04-28 ENCOUNTER — Ambulatory Visit: Payer: 59 | Admitting: Medical

## 2021-05-20 IMAGING — US US EXTREM LOW VENOUS*L*
1 series · 13 of 24 positions shown · non-contrast
Comparison: None.

CLINICAL DATA: Left lower extremity swelling for the past day.
Remote history of fall. Evaluate for DVT.



[Series 1: us extrem low venous*left* · 0.09mm/px · 13 of 35 slices shown]
[im 1/35]
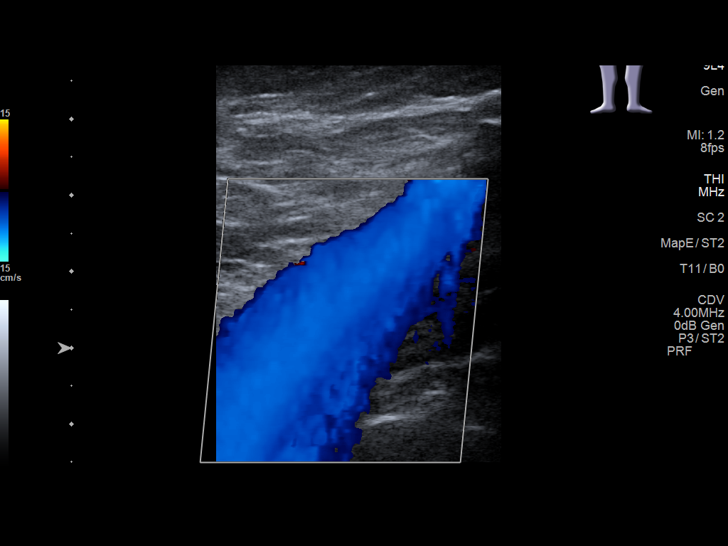
[im 3/35]
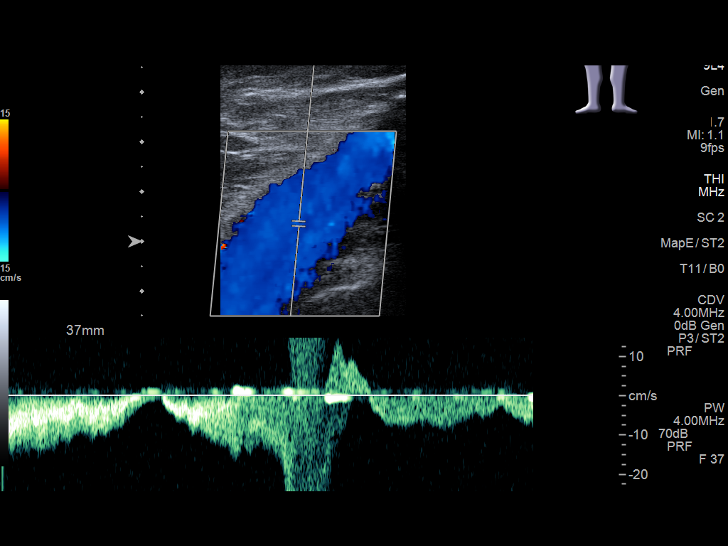
[im 6/35]
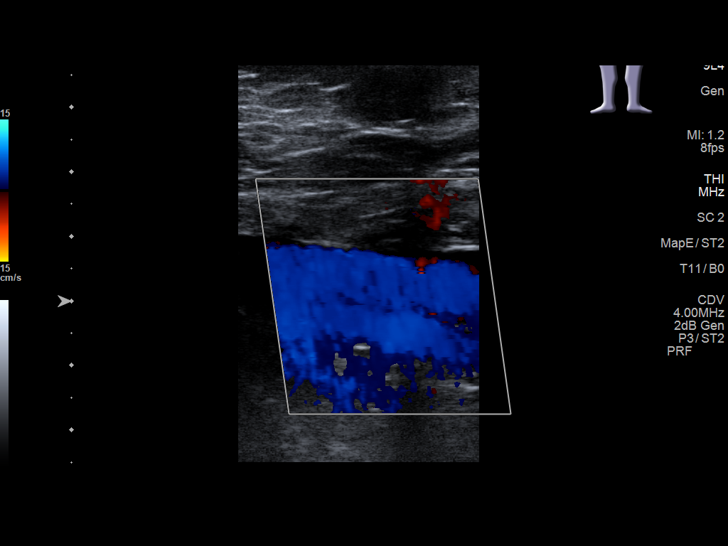
[im 9/35]
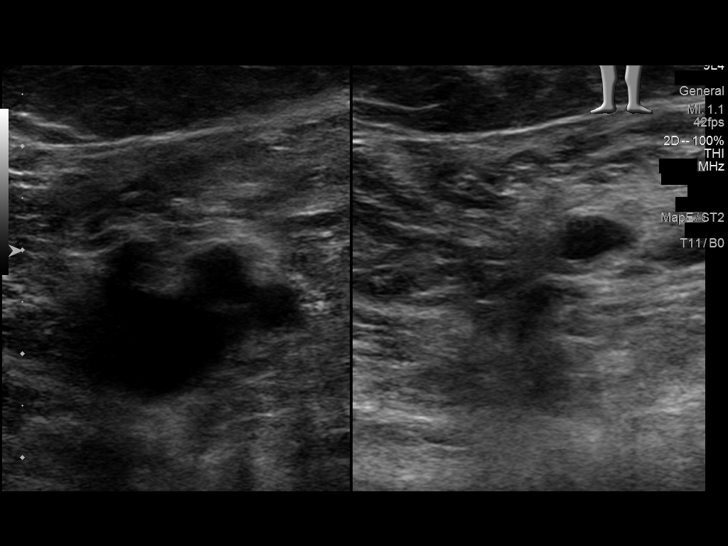
[im 12/35]
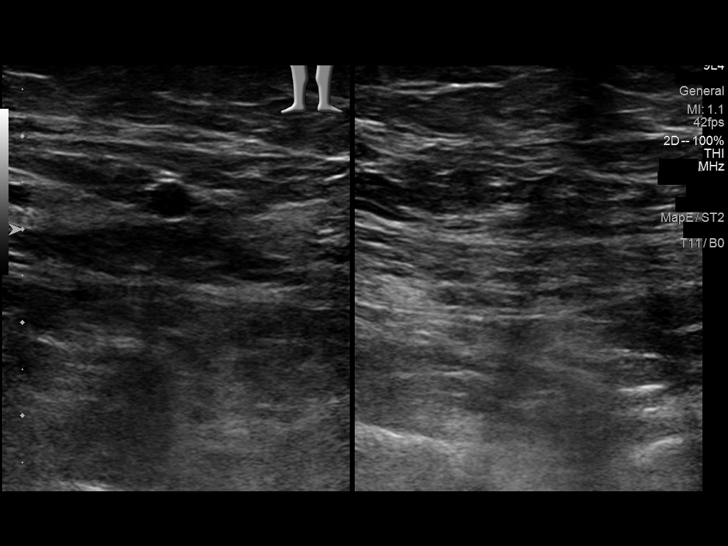
[im 15/35]
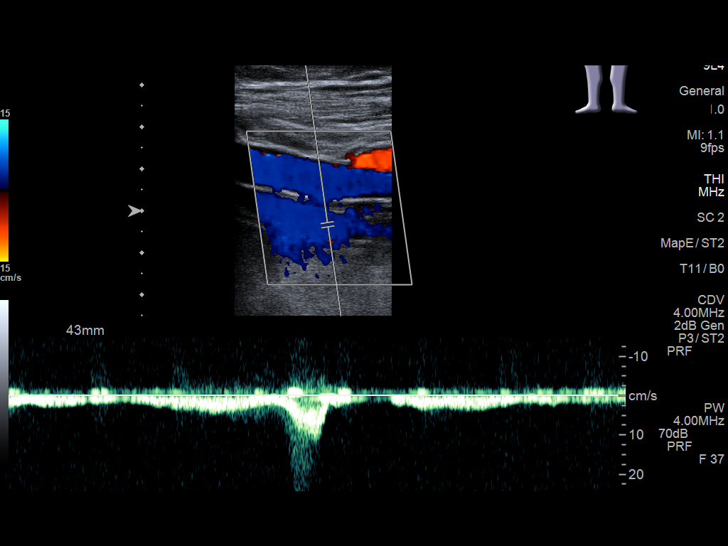
[im 18/35]
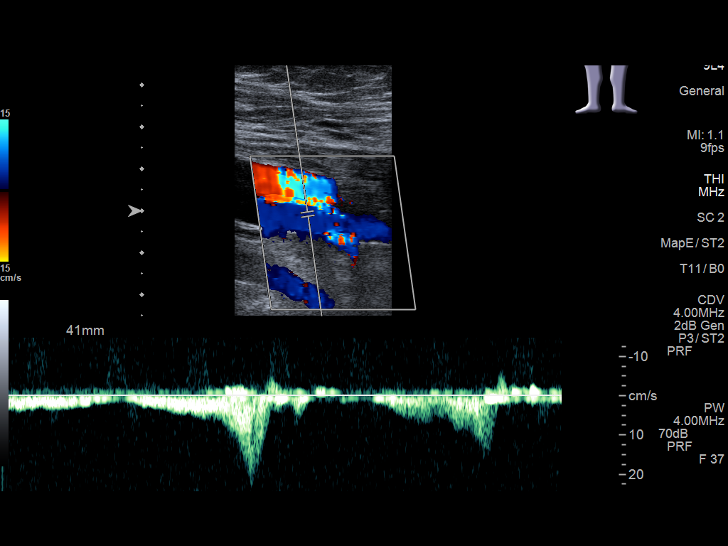
[im 20/35]
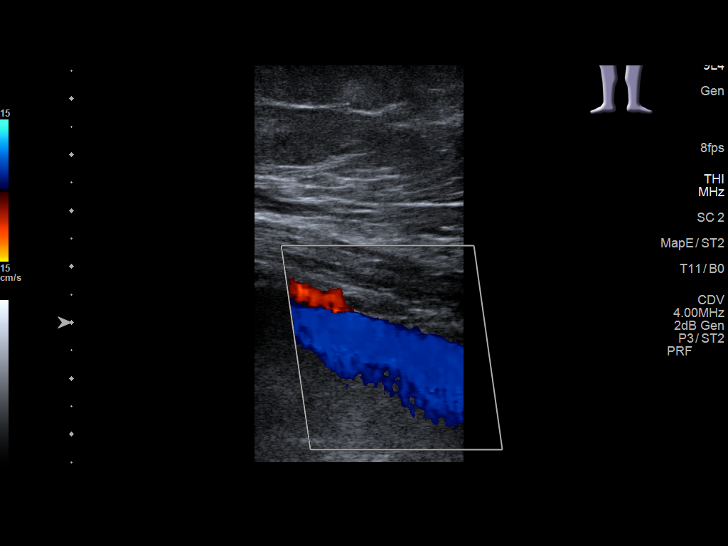
[im 23/35]
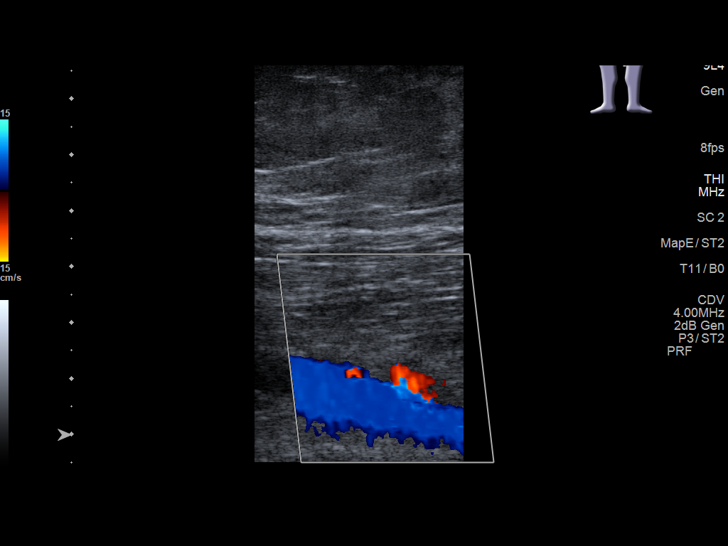
[im 26/35]
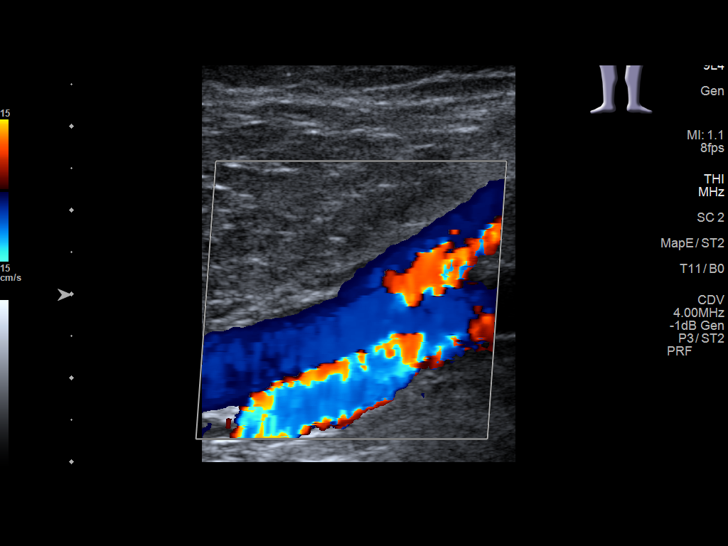
[im 29/35]
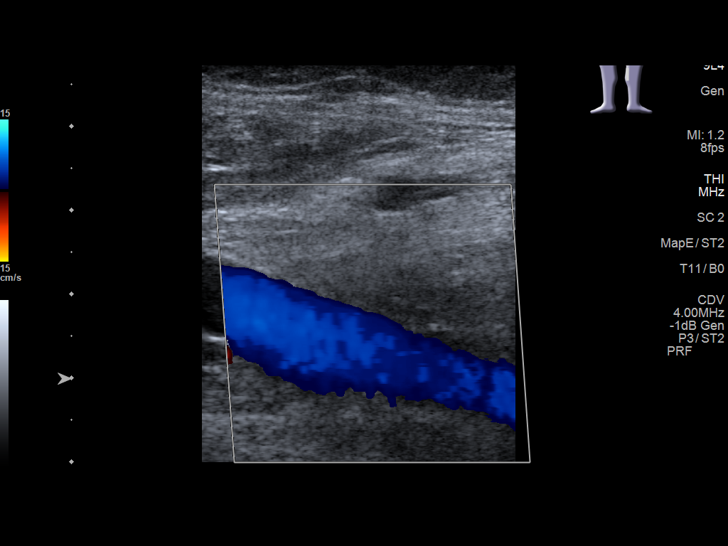
[im 32/35]
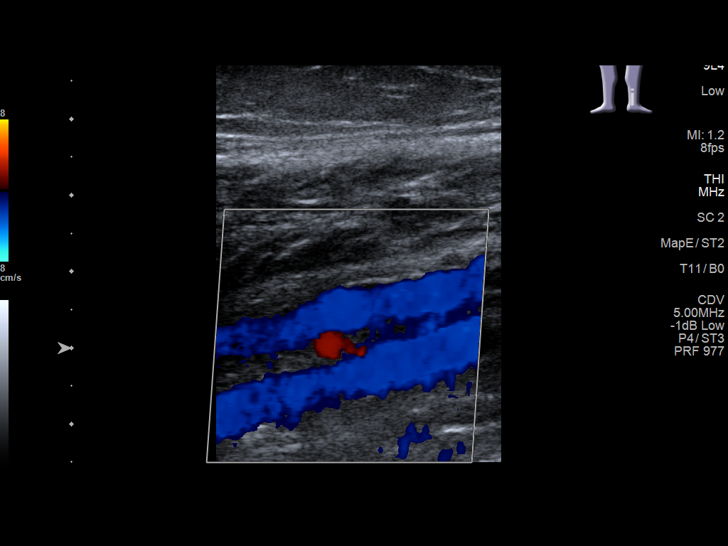
[im 35/35]
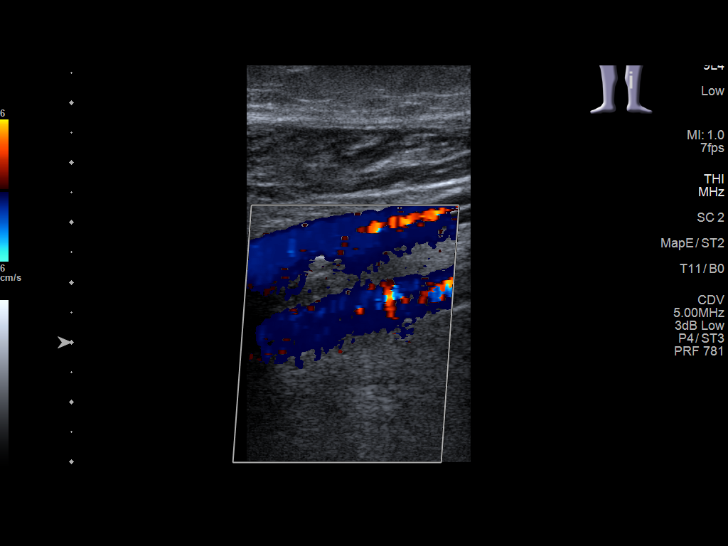

[13 of 24 positions shown; findings below may reference images not displayed]

FINDINGS: Contralateral Common Femoral Vein: Respiratory phasicity is normal
and symmetric with the symptomatic side. No evidence of thrombus.
Normal compressibility.

Common Femoral Vein: No evidence of thrombus. Normal
compressibility, respiratory phasicity and response to augmentation.

Saphenofemoral Junction: No evidence of thrombus. Normal
compressibility and flow on color Doppler imaging.

Profunda Femoral Vein: No evidence of thrombus. Normal
compressibility and flow on color Doppler imaging.

Femoral Vein: No evidence of thrombus. Normal compressibility,
respiratory phasicity and response to augmentation.

Popliteal Vein: No evidence of thrombus. Normal compressibility,
respiratory phasicity and response to augmentation.

Calf Veins: No evidence of thrombus. Normal compressibility and flow
on color Doppler imaging.

Superficial Great Saphenous Vein: No evidence of thrombus. Normal
compressibility.

Venous Reflux:  None.

Other Findings:  None.
IMPRESSION: No evidence of DVT within the left lower extremity.

## 2021-05-25 ENCOUNTER — Other Ambulatory Visit: Payer: Self-pay | Admitting: Nurse Practitioner

## 2021-05-25 ENCOUNTER — Other Ambulatory Visit: Payer: Self-pay

## 2021-06-03 ENCOUNTER — Ambulatory Visit: Payer: 59 | Admitting: Medical

## 2021-06-06 ENCOUNTER — Other Ambulatory Visit: Payer: Self-pay

## 2021-06-06 MED ORDER — LISINOPRIL 20 MG PO TABS: 20.0000 mg | ORAL_TABLET | Freq: Every day | ORAL | 0 refills | Status: DC

## 2021-06-06 NOTE — Telephone Encounter (Signed)
*  STAT* If patient is at the pharmacy, call can be transferred to refill team.   1. Which medications need to be refilled? (please list name of each medication and dose if known) Lisinopril  2. Which pharmacy/location (including street and city if local pharmacy) is medication to be sent to? [facility] [place]  3. Do they need a 30 day or 90 day supply? [place]

## 2021-06-10 ENCOUNTER — Other Ambulatory Visit: Payer: Self-pay | Admitting: Family

## 2021-06-10 DIAGNOSIS — I10 Essential (primary) hypertension: Secondary | ICD-10-CM

## 2021-06-13 ENCOUNTER — Other Ambulatory Visit: Payer: Self-pay | Admitting: Nurse Practitioner

## 2021-06-13 ENCOUNTER — Ambulatory Visit: Payer: 59 | Admitting: Nurse Practitioner

## 2021-06-13 NOTE — Telephone Encounter (Signed)
Requested medications are due for refill today.  yes  Requested medications are on the active medications list.  yes  Last refill. 02/10/2021  Future visit scheduled.   yes  Notes to clinic.  Medication not delegated.

## 2021-06-26 NOTE — Progress Notes (Deleted)
Cardiology Office Note:    Date:  06/26/2021   ID:  Angela, Mullen [birth year], MRN [id number]  PCP:  Jon Billings, NP  [facility] Cardiologist:  Ida Rogue, MD  [facility] Electrophysiologist:  None   Referring MD: Jon Billings, NP   Chief Complaint: 4 month follow-up  History of Present Illness:    Angela Mullen is a 65 y.o. female with a hx of HTN, HLD, DM2, obesity s/p gastric bypass, CKD stag 3, OSA, GERD, COPD who presents for follow-up for BP.   Past Medical History:  Diagnosis Date   Abnormal liver enzymes 09/03/2014   Acid reflux    Allergy    Anemia    H/O   Anxiety    Arthritis    Cataract    COPD (chronic obstructive pulmonary disease) (HCC)    Depression    Diabetes mellitus without complication (HCC)    Dyspnea    WITH EXERTION DUE TO COPD   Elevation of level of transaminase or lactic acid dehydrogenase (LDH) 08/27/2012   Fatty liver    Headache    H/O MIGRAINES   High blood pressure    High cholesterol    Sleep apnea    BIPAP    Past Surgical History:  Procedure Laterality Date   ABDOMINAL HYSTERECTOMY     BARIATRIC SURGERY  06/15/2017   CARDIAC CATHETERIZATION     COLONOSCOPY WITH PROPOFOL N/A 10/10/2018   Procedure: COLONOSCOPY WITH PROPOFOL;  Surgeon: Jonathon Bellows, MD;  Location: [facility] ENDOSCOPY;  Service: Gastroenterology;  Laterality: N/A;   FRACTURE SURGERY  1990   RIGHT LEG    KNEE SURGERY     REPLACEMENT TOTAL KNEE Left    TONSILLECTOMY     TOTAL KNEE ARTHROPLASTY Left 04/28/2020   Procedure: TOTAL KNEE ARTHROPLASTY;  Surgeon: Lovell Sheehan, MD;  Location: [facility] ORS;  Service: Orthopedics;  Laterality: Left;   TUBAL LIGATION      Current Medications: No outpatient medications have been marked as taking for the 06/27/21 encounter (Appointment) with Kathlen Mody, Emmalie Haigh H, PA-C.     Allergies:   Ketorolac, Prednisone, and Nsaids   Social History   Socioeconomic History   Marital status: Married    Spouse  name: Not on file   Number of children: Not on file   Years of education: Not on file   Highest education level: Not on file  Occupational History   Not on file  Tobacco Use   Smoking status: Never   Smokeless tobacco: Never  Vaping Use   Vaping Use: Never used  Substance and Sexual Activity   Alcohol use: Yes    Comment: occassional   Drug use: No   Sexual activity: Not Currently  Other Topics Concern   Not on file  Social History Narrative   Not on file   Social Determinants of Health   Financial Resource Strain: Not on file  Food Insecurity: Not on file  Transportation Needs: Not on file  Physical Activity: Not on file  Stress: Not on file  Social Connections: Not on file     Family History: The patient's ***family history includes Cerebral aneurysm in her mother; Depression in her sister; Diabetes in her brother, father, and paternal grandfather; Heart attack in her maternal grandfather and sister; Heart disease in her paternal grandfather; High blood pressure in her brother; Hyperlipidemia in her paternal grandfather and son; Hypertension in her sister; Kidney disease in her sister; Obesity in her sister; Stroke in her  father, maternal grandmother, and sister.  ROS:   Please see the history of present illness.    *** All other systems reviewed and are negative.  EKGs/Labs/Other Studies Reviewed:    The following studies were reviewed today: ***  EKG:  EKG is *** ordered today.  The ekg ordered today demonstrates ***  Recent Labs: 12/02/2020: Hemoglobin 13.3; Platelets 189; TSH 1.210 04/05/2021: ALT 27; BUN 15; Creatinine, Ser 1.61; Potassium 4.7; Sodium 145  Recent Lipid Panel    Component Value Date/Time   CHOL 144 12/02/2020 1111   TRIG 109 12/02/2020 1111   HDL 60 12/02/2020 1111   CHOLHDL 2.4 12/02/2020 1111   LDLCALC 64 12/02/2020 1111     Risk Assessment/Calculations:   {Does this patient have ATRIAL FIBRILLATION?:[phone]}   Physical Exam:     VS:  There were no vitals taken for this visit.    Wt Readings from Last 3 Encounters:  04/05/21 231 lb 9.6 oz (105.1 kg)  12/02/20 242 lb 6 oz (109.9 kg)  11/01/20 236 lb (107 kg)     GEN: *** Well nourished, well developed in no acute distress HEENT: Normal NECK: No JVD; No carotid bruits LYMPHATICS: No lymphadenopathy CARDIAC: ***RRR, no murmurs, rubs, gallops RESPIRATORY:  Clear to auscultation without rales, wheezing or rhonchi  ABDOMEN: Soft, non-tender, non-distended MUSCULOSKELETAL:  No edema; No deformity  SKIN: Warm and dry NEUROLOGIC:  Alert and oriented x 3 PSYCHIATRIC:  Normal affect   ASSESSMENT:    No diagnosis found. PLAN:    In order of problems listed above:  ***  Disposition: Follow up {follow up:15908} with ***   Shared Decision Making/Informed Consent   {Are you ordering a CV Procedure (e.g. stress test, cath, DCCV, TEE, etc)?   Press F2        :[id number]}    Signed, Traeson Dusza Ninfa Meeker, PA-C  06/26/2021 9:50 AM    [facility]

## 2021-06-27 ENCOUNTER — Ambulatory Visit: Payer: 59 | Admitting: Medical

## 2021-06-27 MED ORDER — TRAZODONE HCL 100 MG PO TABS: 100.0000 mg | ORAL_TABLET | Freq: Every evening | ORAL | 1 refills | Status: DC | PRN

## 2021-06-27 MED ORDER — DULOXETINE HCL 60 MG PO CPEP: 60.0000 mg | ORAL_CAPSULE | Freq: Every day | ORAL | 1 refills | Status: DC

## 2021-06-27 MED ORDER — DULOXETINE HCL 30 MG PO CPEP: 30.0000 mg | ORAL_CAPSULE | Freq: Every day | ORAL | 1 refills | Status: DC

## 2021-07-05 ENCOUNTER — Ambulatory Visit: Payer: 59 | Admitting: Nurse Practitioner

## 2021-07-12 ENCOUNTER — Ambulatory Visit: Payer: 59 | Admitting: Nurse Practitioner

## 2021-07-14 ENCOUNTER — Inpatient Hospital Stay: Admission: RE | Admit: 2021-07-14 | Payer: 59 | Source: Ambulatory Visit

## 2021-07-20 ENCOUNTER — Ambulatory Visit (INDEPENDENT_AMBULATORY_CARE_PROVIDER_SITE_OTHER): Payer: 59 | Admitting: Nurse Practitioner

## 2021-07-20 ENCOUNTER — Other Ambulatory Visit: Payer: Self-pay

## 2021-07-20 ENCOUNTER — Encounter: Payer: Self-pay | Admitting: Nurse Practitioner

## 2021-07-20 VITALS — BP 125/68 | HR 65 | Ht 64.96 in | Wt 223.6 lb

## 2021-07-20 DIAGNOSIS — F411 Generalized anxiety disorder: Secondary | ICD-10-CM

## 2021-07-20 DIAGNOSIS — J449 Chronic obstructive pulmonary disease, unspecified: Secondary | ICD-10-CM | POA: Diagnosis not present

## 2021-07-20 DIAGNOSIS — I10 Essential (primary) hypertension: Secondary | ICD-10-CM

## 2021-07-20 DIAGNOSIS — N1832 Chronic kidney disease, stage 3b: Secondary | ICD-10-CM

## 2021-07-20 DIAGNOSIS — E1169 Type 2 diabetes mellitus with other specified complication: Secondary | ICD-10-CM | POA: Diagnosis not present

## 2021-07-20 DIAGNOSIS — F339 Major depressive disorder, recurrent, unspecified: Secondary | ICD-10-CM

## 2021-07-20 DIAGNOSIS — E785 Hyperlipidemia, unspecified: Secondary | ICD-10-CM

## 2021-07-20 DIAGNOSIS — E118 Type 2 diabetes mellitus with unspecified complications: Secondary | ICD-10-CM | POA: Diagnosis not present

## 2021-07-20 MED ORDER — TRAZODONE HCL 100 MG PO TABS: 200.0000 mg | ORAL_TABLET | Freq: Every evening | ORAL | 1 refills | Status: DC | PRN

## 2021-07-20 NOTE — Assessment & Plan Note (Signed)
Chronic.  Controlled.  Continue with current medication regimen on Lovastatin 8m.  Labs ordered today.  Return to clinic in 6 months for reevaluation.  Call sooner if concerns arise.

## 2021-07-20 NOTE — Assessment & Plan Note (Signed)
Chronic.  Controlled.  Continue with current medication regimen on Ozempic .87m.  Patient has lost 8lbs since last visit. She has lost a total of 25lbs in the last year.  Praised for her weight loss.  Labs ordered today.  Return to clinic in 3 months for reevaluation.  Call sooner if concerns arise.

## 2021-07-20 NOTE — Assessment & Plan Note (Signed)
Chronic.  Controlled.  Continue with current medication regimen of Atenolol 10m, Amlodipine 576m and Lisinopril 2058m Labs ordered today.  Return to clinic in 3 months for reevaluation.  Call sooner if concerns arise.

## 2021-07-20 NOTE — Progress Notes (Signed)
BP 125/68   Pulse 65   Ht 5' 4.96" (1.65 m)   Wt 223 lb 9.6 oz (101.4 kg)   SpO2 95%   BMI 37.25 kg/m    Subjective:    Patient ID: Angela Mullen, female    DOB: [birth year], 65 y.o.   MRN: [id number]  HPI: Angela Mullen is a 65 y.o. female  Chief Complaint  Patient presents with   Depression   Diabetes   ANXIETY/DEPRESSION Patient states her mood is the same.  She feels like her medications are working well.  Denies SI.   [place] Office Visit from 07/20/2021 in [place]  PHQ-9 Total Score 9       CHRONIC KIDNEY DISEASE Patient has been seeing the nephrologist for CKD. They discussed weight loss options to help control diabetes and CKD. Patient has changed her diet and lost some weight.   HYPERTENSION Hypertension status: controlled  Satisfied with current treatment? no Duration of hypertension: years BP monitoring frequency:  not checking BP range:  BP medication side effects:  no Medication compliance: excellent compliance Previous BP meds: atenolol, amlodipine, and lisinopril Aspirin: no Recurrent headaches: no Visual changes: no Palpitations: no Dyspnea: no Chest pain: no Lower extremity edema: no Dizzy/lightheaded: no  DIABETES Hypoglycemic episodes:no Polydipsia/polyuria: no Visual disturbance: no Chest pain: no Paresthesias: no Glucose Monitoring: no  Accucheck frequency: Not Checking  Fasting glucose:  Post prandial:  Evening:  Before meals: Taking Insulin?: no  Long acting insulin:  Short acting insulin: Blood Pressure Monitoring: not checking Retinal Examination: Not up to Date Foot Exam: Up to Date Diabetic Education: Not Completed Pneumovax: Up to Date Influenza: Up to Date Aspirin: no   Denies HA, CP, SOB, dizziness, palpitations, visual changes, and lower extremity swelling. Denies polyuria and polydipsia.  Relevant past medical, surgical, family and social history reviewed and updated as  indicated. Interim medical history since our last visit reviewed. Allergies and medications reviewed and updated.  Review of Systems  Eyes:  Negative for visual disturbance.  Respiratory:  Negative for cough, chest tightness and shortness of breath.   Cardiovascular:  Negative for chest pain, palpitations and leg swelling.  Endocrine: Negative for polydipsia and polyuria.  Neurological:  Negative for dizziness and headaches.   Per HPI unless specifically indicated above     Objective:    BP 125/68   Pulse 65   Ht 5' 4.96" (1.65 m)   Wt 223 lb 9.6 oz (101.4 kg)   SpO2 95%   BMI 37.25 kg/m   Wt Readings from Last 3 Encounters:  07/20/21 223 lb 9.6 oz (101.4 kg)  04/05/21 231 lb 9.6 oz (105.1 kg)  12/02/20 242 lb 6 oz (109.9 kg)    Physical Exam Vitals and nursing note reviewed.  Constitutional:      General: She is not in acute distress.    Appearance: Normal appearance. She is obese. She is not ill-appearing, toxic-appearing or diaphoretic.  HENT:     Head: Normocephalic.     Right Ear: External ear normal.     Left Ear: External ear normal.     Nose: Nose normal.     Mouth/Throat:     Mouth: Mucous membranes are moist.     Pharynx: Oropharynx is clear.  Eyes:     General:        Right eye: No discharge.        Left eye: No discharge.     Extraocular Movements: Extraocular movements  intact.     Conjunctiva/sclera: Conjunctivae normal.     Pupils: Pupils are equal, round, and reactive to light.  Cardiovascular:     Rate and Rhythm: Normal rate and regular rhythm.     Heart sounds: No murmur heard. Pulmonary:     Effort: Pulmonary effort is normal. No respiratory distress.     Breath sounds: Normal breath sounds. No wheezing or rales.  Musculoskeletal:     Cervical back: Normal range of motion and neck supple.  Skin:    General: Skin is warm and dry.     Capillary Refill: Capillary refill takes less than 2 seconds.  Neurological:     General: No focal deficit  present.     Mental Status: She is alert and oriented to person, place, and time. Mental status is at baseline.  Psychiatric:        Mood and Affect: Mood normal.        Behavior: Behavior normal.        Thought Content: Thought content normal.        Judgment: Judgment normal.    Results for orders placed or performed in visit on 04/05/21  Comp Met (CMET)  Result Value Ref Range   Glucose 87 65 - 99 mg/dL   BUN 15 8 - 27 mg/dL   Creatinine, Ser 1.61 (H) 0.57 - 1.00 mg/dL   eGFR 36 (L) >59 mL/min/1.73   BUN/Creatinine Ratio 9 (L) 12 - 28   Sodium 145 (H) 134 - 144 mmol/L   Potassium 4.7 3.5 - 5.2 mmol/L   Chloride 107 (H) 96 - 106 mmol/L   CO2 17 (L) 20 - 29 mmol/L   Calcium 9.2 8.7 - 10.3 mg/dL   Total Protein 5.9 (L) 6.0 - 8.5 g/dL   Albumin 4.4 3.8 - 4.8 g/dL   Globulin, Total 1.5 1.5 - 4.5 g/dL   Albumin/Globulin Ratio 2.9 (H) 1.2 - 2.2   Bilirubin Total 0.3 0.0 - 1.2 mg/dL   Alkaline Phosphatase 97 44 - 121 IU/L   AST 23 0 - 40 IU/L   ALT 27 0 - 32 IU/L  HgB A1c  Result Value Ref Range   Hgb A1c MFr Bld 5.4 4.8 - 5.6 %   Est. average glucose Bld gHb Est-mCnc 108 mg/dL      Assessment & Plan:   Problem List Items Addressed This Visit       Cardiovascular and Mediastinum   Essential (primary) hypertension - Primary    Chronic.  Controlled.  Continue with current medication regimen of Atenolol 62m, Amlodipine 523m and Lisinopril 2045m Labs ordered today.  Return to clinic in 3 months for reevaluation.  Call sooner if concerns arise.        Relevant Orders   Comp Met (CMET)     Respiratory   Chronic obstructive pulmonary disease (HCC)    Chronic.  Controlled.  Continue with current medication regimen.  Labs ordered today.  Return to clinic in 3 months for reevaluation.  Call sooner if concerns arise.          Endocrine   Hyperlipidemia associated with type 2 diabetes mellitus (HCC)    Chronic.  Controlled.  Continue with current medication regimen on  Lovastatin 32m76mLabs ordered today.  Return to clinic in 6 months for reevaluation.  Call sooner if concerns arise.        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  Controlled.  Continue with current medication regimen on Ozempic .80m.  Patient has lost 8lbs since last visit. She has lost a total of 25lbs in the last year.  Praised for her weight loss.  Labs ordered today.  Return to clinic in 3 months for reevaluation.  Call sooner if concerns arise.        Relevant Orders   HgB A1c     Genitourinary   Chronic kidney disease (CKD), stage III (moderate) (HCC)    Controlled.  Followed by Nephrology. Continue with Ozempic for kidney protection. Diabetes is well controlled.         Other   Generalized anxiety disorder    Chronic.  Controlled.  Continue with current medication regimen of Duloxetine 960mand Wellbutrin 15052mID.  Labs ordered today.  Return to clinic in 3 months for reevaluation.  Call sooner if concerns arise.        Relevant Medications   traZODone (DESYREL) 100 MG tablet   Morbid obesity with body mass index of 40.0-49.9 ([place]  Patient has lost 25lbs over the last year.  8lbs since our last visit.  Praised for weight loss. Continue with healthy habits to maintain and continue to lose weight.       Depression, recurrent (HCC)    Chronic.  Controlled.  Continue with current medication regimen of Duloxetine 60m57md Wellbutrin 150mg42m.  Labs ordered today.  Return to clinic in 3 months for reevaluation.  Call sooner if concerns arise.        Relevant Medications   traZODone (DESYREL) 100 MG tablet     Follow up plan: Return in about 3 months (around 10/20/2021) for HTN, HLD, DM2 FU.

## 2021-07-20 NOTE — Assessment & Plan Note (Signed)
Chronic.  Controlled.  Continue with current medication regimen.  Labs ordered today.  Return to clinic in 3 months for reevaluation.  Call sooner if concerns arise.

## 2021-07-20 NOTE — Assessment & Plan Note (Signed)
Controlled.  Followed by Nephrology. Continue with Ozempic for kidney protection. Diabetes is well controlled.

## 2021-07-20 NOTE — Assessment & Plan Note (Signed)
Chronic.  Controlled.  Continue with current medication regimen of Duloxetine 21m and Wellbutrin 1528mBID.  Labs ordered today.  Return to clinic in 3 months for reevaluation.  Call sooner if concerns arise.

## 2021-07-20 NOTE — Assessment & Plan Note (Signed)
Chronic.  Controlled.  Continue with current medication regimen of Duloxetine 43m and Wellbutrin 1543mBID.  Labs ordered today.  Return to clinic in 3 months for reevaluation.  Call sooner if concerns arise.

## 2021-07-20 NOTE — Assessment & Plan Note (Signed)
Patient has lost 25lbs over the last year.  8lbs since our last visit.  Praised for weight loss. Continue with healthy habits to maintain and continue to lose weight.

## 2021-07-21 LAB — LIPID PANEL
Chol/HDL Ratio: 2.4 ratio (ref 0.0–4.4)
Cholesterol, Total: 137 mg/dL (ref 100–199)
HDL: 56 mg/dL (ref >39)
LDL Chol Calc (NIH): 65 mg/dL (ref 0–99)
Triglycerides: 80 mg/dL (ref 0–149)
VLDL Cholesterol Cal: 16 mg/dL (ref 5–40)

## 2021-07-21 LAB — COMPREHENSIVE METABOLIC PANEL
ALT: 50 IU/L — ABNORMAL HIGH (ref 0–32)
AST: 33 IU/L (ref 0–40)
Albumin/Globulin Ratio: 2.1 (ref 1.2–2.2)
Albumin: 4 g/dL (ref 3.8–4.8)
Alkaline Phosphatase: 114 IU/L (ref 44–121)
BUN/Creatinine Ratio: 9 — ABNORMAL LOW (ref 12–28)
BUN: 13 mg/dL (ref 8–27)
Bilirubin Total: 0.2 mg/dL (ref 0.0–1.2)
CO2: 19 mmol/L — ABNORMAL LOW (ref 20–29)
Calcium: 8.9 mg/dL (ref 8.7–10.3)
Chloride: 110 mmol/L — ABNORMAL HIGH (ref 96–106)
Creatinine, Ser: 1.46 mg/dL — ABNORMAL HIGH (ref 0.57–1.00)
Globulin, Total: 1.9 g/dL (ref 1.5–4.5)
Glucose: 79 mg/dL (ref 70–99)
Potassium: 4.1 mmol/L (ref 3.5–5.2)
Sodium: 142 mmol/L (ref 134–144)
Total Protein: 5.9 g/dL — ABNORMAL LOW (ref 6.0–8.5)
eGFR: 40 mL/min/{1.73_m2} — ABNORMAL LOW (ref >59)

## 2021-07-21 LAB — HEMOGLOBIN A1C
Est. average glucose Bld gHb Est-mCnc: 105 mg/dL
Hgb A1c MFr Bld: 5.3 % (ref 4.8–5.6)

## 2021-07-21 NOTE — Progress Notes (Signed)
Hi Angela Mullen.  Your lab work looks great.  Your A1c is well controlled at 5.3.  Your cholesterol also is well controlled.  Keep up the great work.  Please let me know if you have any questions.

## 2021-07-22 ENCOUNTER — Telehealth: Payer: Self-pay | Admitting: Nurse Practitioner

## 2021-07-22 NOTE — Telephone Encounter (Signed)
Called the pt due to her being in the office on 07/20/2021 inquiring about her 2 $0 copay before she has to pay a copay.  I researched it with [place] [employer]) and found that she had a outstanding balance and the two $30.00 copays that was paid on 01/11/2021 and 04/05/2021 went towards the outstanding balance.  Pt verbalized that she understood and nothing further is needed.

## 2021-07-27 ENCOUNTER — Ambulatory Visit: Payer: 59

## 2021-09-04 ENCOUNTER — Other Ambulatory Visit: Payer: Self-pay | Admitting: Cardiovascular Disease

## 2021-09-05 NOTE — Telephone Encounter (Signed)
Attempted to schedule.  LMOV to call office.  ° °

## 2021-09-05 NOTE — Telephone Encounter (Signed)
Please schedule overdue F/U appointment for refills. Thank you! 

## 2021-09-08 ENCOUNTER — Other Ambulatory Visit: Payer: Self-pay | Admitting: *Deleted

## 2021-09-08 MED ORDER — ATENOLOL 50 MG PO TABS: ORAL_TABLET | ORAL | 0 refills | Status: DC

## 2021-09-09 ENCOUNTER — Telehealth: Payer: Self-pay | Admitting: Cardiovascular Disease

## 2021-09-09 DIAGNOSIS — E782 Mixed hyperlipidemia: Secondary | ICD-10-CM

## 2021-09-09 MED ORDER — LOVASTATIN 40 MG PO TABS: 40.0000 mg | ORAL_TABLET | Freq: Every day | ORAL | 0 refills | Status: DC

## 2021-09-09 NOTE — Telephone Encounter (Signed)
°*  STAT* If patient is at the pharmacy, call can be transferred to refill team.   1. Which medications need to be refilled? (please list name of each medication and dose if known)   Atenolol and lovastatin  2. Which pharmacy/location (including street and city if local pharmacy) is medication to be sent to? [facility] [place]   3. Do they need a 30 day or 90 day supply? [place]

## 2021-09-09 NOTE — Telephone Encounter (Signed)
Requested Prescriptions   Signed Prescriptions Disp Refills   lovastatin (MEVACOR) 40 MG tablet 90 tablet 0    Sig: Take 1 tablet (40 mg total) by mouth at bedtime.    Authorizing Provider: Minna Merritts    Ordering User: Raelene Bott, Cordai Rodrigue L   Atenolol sent to pharmacy yesterday.

## 2021-09-23 ENCOUNTER — Other Ambulatory Visit: Payer: Self-pay | Admitting: Nurse Practitioner

## 2021-09-23 NOTE — Telephone Encounter (Signed)
Requested medication (s) are due for refill today: yes  Requested medication (s) are on the active medication list: yes  Last refill:  06/13/21 #180  Future visit scheduled: no  Notes to clinic:  Please review for refill. Refill not delegated per protocol    Requested Prescriptions  Pending Prescriptions Disp Refills   pregabalin (LYRICA) 150 MG capsule [Pharmacy Med Name: Pregabalin 150 MG Oral Capsule] 180 capsule 0    Sig: Take 2 capsules by mouth once daily     Not Delegated - Neurology:  Anticonvulsants - Controlled Failed - 09/23/2021  8:32 AM      Failed - This refill cannot be delegated      Passed - Valid encounter within last 12 months    Recent Outpatient Visits           2 months ago Essential (primary) hypertension   [place], NP   5 months ago Generalized anxiety disorder   [place], Santiago Glad, NP   8 months ago Depression, major, single episode, in partial remission [facility])   [facility] Jon Billings, NP   9 months ago Annual physical exam   [facility] Jon Billings, NP   10 months ago Essential (primary) hypertension   [employer], [place], DO       Future Appointments             In 2 weeks [place], Kathlene November, MD [facility], [facility]

## 2021-09-23 NOTE — Telephone Encounter (Signed)
Lmom asking pt to call back to schedule an appt. °

## 2021-09-23 NOTE — Telephone Encounter (Signed)
Due for follow up in January.

## 2021-09-27 NOTE — Telephone Encounter (Signed)
2nd attempt- Unable to leave vm to schedule an appt.

## 2021-09-28 NOTE — Telephone Encounter (Signed)
3rd attempt- Unable to leave vm to schedule appt.

## 2021-10-05 ENCOUNTER — Other Ambulatory Visit: Payer: Self-pay | Admitting: Nurse Practitioner

## 2021-10-05 NOTE — Telephone Encounter (Signed)
Requested medication (s) are due for refill today:   Provider to review  Requested medication (s) are on the active medication list:   Yes  Future visit scheduled:   No   Last ordered: 03/29/2021 30 g, 1 refill  Returned because no protocol assigned to this medication.   Requested Prescriptions  Pending Prescriptions Disp Refills   clotrimazole-betamethasone (LOTRISONE) cream [Pharmacy Med Name: Clotrimazole-Betamethasone 1-0.05 % External Cream] 30 g 0    Sig: APPLY  CREAM TOPICALLY TWICE DAILY AS NEEDED     Off-Protocol Failed - 10/05/2021  9:49 AM      Failed - Medication not assigned to a protocol, review manually.      Passed - Valid encounter within last 12 months    Recent Outpatient Visits           2 months ago Essential (primary) hypertension   [facility] Jon Billings, NP   6 months ago Generalized anxiety disorder   [facility] Jon Billings, NP   8 months ago Depression, major, single episode, in partial remission [facility])   [facility] Jon Billings, NP   10 months ago Annual physical exam   [facility] Jon Billings, NP   11 months ago Essential (primary) hypertension   [employer], [place], DO       Future Appointments             In 1 week [place], Kathlene November, MD [facility], [facility]

## 2021-10-12 ENCOUNTER — Ambulatory Visit: Payer: Self-pay | Admitting: Cardiovascular Disease

## 2021-10-12 ENCOUNTER — Other Ambulatory Visit: Payer: Self-pay | Admitting: Nurse Practitioner

## 2021-10-12 NOTE — Telephone Encounter (Signed)
Requested medication (s) are due for refill today: yes  Requested medication (s) are on the active medication list: yes  Last refill:  10/11/15  Future visit scheduled: no  Notes to clinic:  historical med. Please advise     Requested Prescriptions  Pending Prescriptions Disp Refills   buPROPion (WELLBUTRIN SR) 150 MG 12 hr tablet      Sig: Take 1 tablet (150 mg total) by mouth 2 (two) times daily.     Psychiatry: Antidepressants - bupropion Passed - 10/12/2021  4:06 PM      Passed - Completed PHQ-2 or PHQ-9 in the last 360 days      Passed - Last BP in normal range    BP Readings from Last 1 Encounters:  07/20/21 125/68          Passed - Valid encounter within last 6 months    Recent Outpatient Visits           2 months ago Essential (primary) hypertension   [facility] Jon Billings, NP   6 months ago Generalized anxiety disorder   [facility] Jon Billings, NP   9 months ago Depression, major, single episode, in partial remission [facility])   [facility] Jon Billings, NP   10 months ago Annual physical exam   [place], NP   11 months ago Essential (primary) hypertension   [employer], [place], DO

## 2021-10-12 NOTE — Telephone Encounter (Signed)
Medication Refill - Medication: buPROPion (WELLBUTRIN SR) 150 MG 12 hr tablet    Has the patient contacted their pharmacy? yes (Agent: If no, request that the patient contact the pharmacy for the refill. If patient does not wish to contact the pharmacy document the reason why and proceed with request.) (Agent: If yes, when and what did the pharmacy advise?)  Preferred Pharmacy (with phone number or street name): [place], [place] Phone:  [phone]  Fax:  [phone]     Has the patient been seen for an appointment in the last year OR does the patient have an upcoming appointment? yes  Agent: Please be advised that RX refills may take up to 3 business days. We ask that you follow-up with your pharmacy.

## 2021-10-13 MED ORDER — BUPROPION HCL ER (SR) 150 MG PO TB12: 150.0000 mg | ORAL_TABLET | Freq: Two times a day (BID) | ORAL | 0 refills | Status: DC

## 2021-10-13 NOTE — Telephone Encounter (Signed)
Lmom for patient to call back to schedule follow up visit.

## 2021-10-13 NOTE — Telephone Encounter (Signed)
Patient is due for follow up.

## 2021-10-14 NOTE — Telephone Encounter (Signed)
PT scheduled

## 2021-10-26 ENCOUNTER — Telehealth: Payer: Self-pay | Admitting: Nurse Practitioner

## 2021-10-26 NOTE — Telephone Encounter (Signed)
Medication Refill - Medication: pregabalin (LYRICA) 150 MG capsule  Has the patient contacted their pharmacy? No.No, more refills.  (Agent: If no, request that the patient contact the pharmacy for the refill. If patient does not wish to contact the pharmacy document the reason why and proceed with request.)   Preferred Pharmacy (with phone number or street name):  [place], [place] [address]  [place] [address]  Phone: [phone] Fax: [phone]  Hours: Not open 24 hours   Has the patient been seen for an appointment in the last year OR does the patient have an upcoming appointment? Yes.    Agent: Please be advised that RX refills may take up to 3 business days. We ask that you follow-up with your pharmacy.

## 2021-10-27 MED ORDER — PREGABALIN 150 MG PO CAPS: 300.0000 mg | ORAL_CAPSULE | Freq: Every day | ORAL | 0 refills | Status: DC

## 2021-10-27 NOTE — Telephone Encounter (Signed)
Requested medication (s) are due for refill today: yes  Requested medication (s) are on the active medication list: yes  Last refill:  09/23/21 #60 for 0 RF   Future visit scheduled: yes 11/15/21  Notes to clinic:  This medication can not be delegated, please assess.   Requested Prescriptions  Pending Prescriptions Disp Refills   pregabalin (LYRICA) 150 MG capsule 60 capsule 0    Sig: Take 2 capsules (300 mg total) by mouth daily.     Not Delegated - Neurology:  Anticonvulsants - Controlled - pregabalin Failed - 10/26/2021  6:07 PM      Failed - This refill cannot be delegated      Failed - Cr in normal range and within 360 days    Creatinine  Date Value Ref Range Status  05/11/2013 0.97 0.60 - 1.30 mg/dL Final   Creatinine, Ser  Date Value Ref Range Status  07/20/2021 1.46 (H) 0.57 - 1.00 mg/dL Final          Passed - Completed PHQ-2 or PHQ-9 in the last 360 days      Passed - Valid encounter within last 12 months    Recent Outpatient Visits           3 months ago Essential (primary) hypertension   [place], Karen, NP   6 months ago Generalized anxiety disorder   [facility] Jon Billings, NP   9 months ago Depression, major, single episode, in partial remission [facility])   [facility] Jon Billings, NP   10 months ago Annual physical exam   [place], NP   12 months ago Essential (primary) hypertension   [employer], Barb Merino, DO       Future Appointments             In 2 weeks Jon Billings, NP [facility], [place]

## 2021-10-27 NOTE — Telephone Encounter (Signed)
Last courtesy refill until patient comes in for an appt.

## 2021-10-28 NOTE — Telephone Encounter (Signed)
Pt has appt 2/21, does she need to come in sooner?

## 2021-10-28 NOTE — Telephone Encounter (Signed)
Routing to provider to advise.  

## 2021-10-28 NOTE — Telephone Encounter (Signed)
Patient is due for an appt. She should come in for an appt to discuss.

## 2021-10-28 NOTE — Telephone Encounter (Signed)
Pt aware that she needs an appt if she needs the medication sooner. Pt states that she will be out of town due to death in family and she will make it work until she gets back.

## 2021-10-28 NOTE — Telephone Encounter (Signed)
If she needs the medication sooner she will need to come in.

## 2021-10-28 NOTE — Telephone Encounter (Signed)
Pharmacy states Rx pregabalin needs a prior authorization  Pharmacist told me the prescription exceeds plan limitations.  They don't like pt taking 2 / day. Can change to 1/300 mg if pt needs to take. Please call [phone] for PA

## 2021-11-15 ENCOUNTER — Ambulatory Visit (INDEPENDENT_AMBULATORY_CARE_PROVIDER_SITE_OTHER): Payer: 59 | Admitting: Nurse Practitioner

## 2021-11-15 ENCOUNTER — Encounter: Payer: Self-pay | Admitting: Nurse Practitioner

## 2021-11-15 ENCOUNTER — Other Ambulatory Visit: Payer: Self-pay

## 2021-11-15 VITALS — BP 120/69 | HR 62 | Temp 98.5°F | Wt 222.6 lb

## 2021-11-15 DIAGNOSIS — J449 Chronic obstructive pulmonary disease, unspecified: Secondary | ICD-10-CM

## 2021-11-15 DIAGNOSIS — R69 Illness, unspecified: Secondary | ICD-10-CM | POA: Diagnosis not present

## 2021-11-15 DIAGNOSIS — E782 Mixed hyperlipidemia: Secondary | ICD-10-CM

## 2021-11-15 DIAGNOSIS — E1169 Type 2 diabetes mellitus with other specified complication: Secondary | ICD-10-CM

## 2021-11-15 DIAGNOSIS — I10 Essential (primary) hypertension: Secondary | ICD-10-CM

## 2021-11-15 DIAGNOSIS — E785 Hyperlipidemia, unspecified: Secondary | ICD-10-CM

## 2021-11-15 DIAGNOSIS — F411 Generalized anxiety disorder: Secondary | ICD-10-CM

## 2021-11-15 DIAGNOSIS — F339 Major depressive disorder, recurrent, unspecified: Secondary | ICD-10-CM | POA: Diagnosis not present

## 2021-11-15 DIAGNOSIS — N1832 Chronic kidney disease, stage 3b: Secondary | ICD-10-CM

## 2021-11-15 DIAGNOSIS — E118 Type 2 diabetes mellitus with unspecified complications: Secondary | ICD-10-CM | POA: Diagnosis not present

## 2021-11-15 MED ORDER — BREXPIPRAZOLE 0.25 MG PO TABS: 0.2500 mg | ORAL_TABLET | Freq: Every day | ORAL | 0 refills | Status: DC

## 2021-11-15 MED ORDER — PREGABALIN 150 MG PO CAPS: 300.0000 mg | ORAL_CAPSULE | Freq: Every day | ORAL | 2 refills | Status: DC

## 2021-11-15 MED ORDER — LISINOPRIL 20 MG PO TABS: 20.0000 mg | ORAL_TABLET | Freq: Every day | ORAL | 1 refills | Status: DC

## 2021-11-15 NOTE — Assessment & Plan Note (Signed)
Chronic. Not well controlled. On Cymbalta 75m daily due to Fibromyalgia and depression.  Hesitant to change medication.  Also on Wellbutirn 1549mBID. Will start Rexulti 0.2529m If tolerating well after 2 weeks can increase to 0.5mg28mily.  Follow up in 1 month. New referral placed for psychiatry.

## 2021-11-15 NOTE — Assessment & Plan Note (Signed)
Chronic.  Controlled.  Continue with current medication regimen on Ozempic .26m.   Labs ordered today. Will make recommendations based on lab results.  Return to clinic in 3 months for reevaluation.  Call sooner if concerns arise.

## 2021-11-15 NOTE — Assessment & Plan Note (Signed)
Chronic.  Controlled.  Continue with current medication regimen on Lovastatin 64m.  Labs ordered today.  Return to clinic in 3 months for reevaluation.  Call sooner if concerns arise.

## 2021-11-15 NOTE — Progress Notes (Signed)
BP 120/69    Pulse 62    Temp 98.5 F (36.9 C) (Oral)    Wt 222 lb 9.6 oz (101 kg)    SpO2 97%    BMI 37.09 kg/m    Subjective:    Patient ID: Angela Mullen, female    DOB: 26-Jul-1956, 66 y.o.   MRN: [id number]  HPI: Angela Mullen is a 66 y.o. female  Chief Complaint  Patient presents with   Anxiety   Diabetes   Hyperlipidemia   Hypertension   ANXIETY/DEPRESSION Patient states she feels like she is sliding backwards.  She doesn't have any interest in going anywhere.  She is nervous and depressed.  Denies SI.   [facility] Office Visit from 11/15/2021 in [place]  PHQ-9 Total Score 16       CHRONIC KIDNEY DISEASE Patient has been seeing the nephrologist for CKD. They discussed weight loss options to help control diabetes and CKD. Patient has changed her diet and lost some weight.   HYPERTENSION Hypertension status: controlled  Satisfied with current treatment? no Duration of hypertension: years BP monitoring frequency:  not checking BP range:  BP medication side effects:  no Medication compliance: excellent compliance Previous BP meds: atenolol, amlodipine, and lisinopril Aspirin: no Recurrent headaches: no Visual changes: no Palpitations: no Dyspnea: no Chest pain: no Lower extremity edema: no Dizzy/lightheaded: no  DIABETES Hypoglycemic episodes:no Polydipsia/polyuria: no Visual disturbance: no Chest pain: no Paresthesias: no Glucose Monitoring: no  Accucheck frequency: Not Checking  Fasting glucose:  Post prandial:  Evening:  Before meals: Taking Insulin?: no  Long acting insulin:  Short acting insulin: Blood Pressure Monitoring: not checking Retinal Examination: Not up to Date Foot Exam: Up to Date Diabetic Education: Not Completed Pneumovax: Up to Date Influenza: Up to Date Aspirin: no   Denies HA, CP, SOB, dizziness, palpitations, visual changes, and lower extremity swelling. Denies polyuria and  polydipsia.  Relevant past medical, surgical, family and social history reviewed and updated as indicated. Interim medical history since our last visit reviewed. Allergies and medications reviewed and updated.  Review of Systems  Eyes:  Negative for visual disturbance.  Respiratory:  Negative for cough, chest tightness and shortness of breath.   Cardiovascular:  Negative for chest pain, palpitations and leg swelling.  Endocrine: Negative for polydipsia and polyuria.  Neurological:  Negative for dizziness and headaches.  Psychiatric/Behavioral:  Positive for dysphoric mood. Negative for suicidal ideas. The patient is nervous/anxious.    Per HPI unless specifically indicated above     Objective:    BP 120/69    Pulse 62    Temp 98.5 F (36.9 C) (Oral)    Wt 222 lb 9.6 oz (101 kg)    SpO2 97%    BMI 37.09 kg/m   Wt Readings from Last 3 Encounters:  11/15/21 222 lb 9.6 oz (101 kg)  07/20/21 223 lb 9.6 oz (101.4 kg)  04/05/21 231 lb 9.6 oz (105.1 kg)    Physical Exam Vitals and nursing note reviewed.  Constitutional:      General: She is not in acute distress.    Appearance: Normal appearance. She is obese. She is not ill-appearing, toxic-appearing or diaphoretic.  HENT:     Head: Normocephalic.     Right Ear: External ear normal.     Left Ear: External ear normal.     Nose: Nose normal.     Mouth/Throat:     Mouth: Mucous membranes are moist.  Pharynx: Oropharynx is clear.  Eyes:     General:        Right eye: No discharge.        Left eye: No discharge.     Extraocular Movements: Extraocular movements intact.     Conjunctiva/sclera: Conjunctivae normal.     Pupils: Pupils are equal, round, and reactive to light.  Cardiovascular:     Rate and Rhythm: Normal rate and regular rhythm.     Heart sounds: No murmur heard. Pulmonary:     Effort: Pulmonary effort is normal. No respiratory distress.     Breath sounds: Normal breath sounds. No wheezing or rales.   Musculoskeletal:     Cervical back: Normal range of motion and neck supple.  Skin:    General: Skin is warm and dry.     Capillary Refill: Capillary refill takes less than 2 seconds.  Neurological:     General: No focal deficit present.     Mental Status: She is alert and oriented to person, place, and time. Mental status is at baseline.  Psychiatric:        Mood and Affect: Mood normal.        Behavior: Behavior normal.        Thought Content: Thought content normal.        Judgment: Judgment normal.    Results for orders placed or performed in visit on 07/20/21  Comp Met (CMET)  Result Value Ref Range   Glucose 79 70 - 99 mg/dL   BUN 13 8 - 27 mg/dL   Creatinine, Ser 1.46 (H) 0.57 - 1.00 mg/dL   eGFR 40 (L) >59 mL/min/1.73   BUN/Creatinine Ratio 9 (L) 12 - 28   Sodium 142 134 - 144 mmol/L   Potassium 4.1 3.5 - 5.2 mmol/L   Chloride 110 (H) 96 - 106 mmol/L   CO2 19 (L) 20 - 29 mmol/L   Calcium 8.9 8.7 - 10.3 mg/dL   Total Protein 5.9 (L) 6.0 - 8.5 g/dL   Albumin 4.0 3.8 - 4.8 g/dL   Globulin, Total 1.9 1.5 - 4.5 g/dL   Albumin/Globulin Ratio 2.1 1.2 - 2.2   Bilirubin Total 0.2 0.0 - 1.2 mg/dL   Alkaline Phosphatase 114 44 - 121 IU/L   AST 33 0 - 40 IU/L   ALT 50 (H) 0 - 32 IU/L  Lipid Profile  Result Value Ref Range   Cholesterol, Total 137 100 - 199 mg/dL   Triglycerides 80 0 - 149 mg/dL   HDL 56 >39 mg/dL   VLDL Cholesterol Cal 16 5 - 40 mg/dL   LDL Chol Calc (NIH) 65 0 - 99 mg/dL   Chol/HDL Ratio 2.4 0.0 - 4.4 ratio  HgB A1c  Result Value Ref Range   Hgb A1c MFr Bld 5.3 4.8 - 5.6 %   Est. average glucose Bld gHb Est-mCnc 105 mg/dL      Assessment & Plan:   Problem List Items Addressed This Visit       Cardiovascular and Mediastinum   Essential (primary) hypertension - Primary    Chronic.  Controlled.  Continue with current medication regimen of Atenolol 33m, Amlodipine 574m and Lisinopril 2014mRefilled Lisinopril today. Labs ordered today.  Return to  clinic in 3 months for reevaluation.  Call sooner if concerns arise.        Relevant Medications   lisinopril (ZESTRIL) 20 MG tablet   Other Relevant Orders   Comp Met (CMET)     Respiratory  Chronic obstructive pulmonary disease (HCC)    Chronic.  Controlled.  Continue with current medication regimen.  Labs ordered today.  Return to clinic in 3 months for reevaluation.  Call sooner if concerns arise.          Endocrine   Hyperlipidemia associated with type 2 diabetes mellitus (HCC)    Chronic.  Controlled.  Continue with current medication regimen on Lovastatin 61m.  Labs ordered today.  Return to clinic in 3 months for reevaluation.  Call sooner if concerns arise.        Relevant Medications   lisinopril (ZESTRIL) 20 MG tablet   Other Relevant Orders   Lipid Profile   DM (diabetes mellitus), type 2 with complications (HCC)    Chronic.  Controlled.  Continue with current medication regimen on Ozempic .260m   Labs ordered today. Will make recommendations based on lab results.  Return to clinic in 3 months for reevaluation.  Call sooner if concerns arise.        Relevant Medications   lisinopril (ZESTRIL) 20 MG tablet   Other Relevant Orders   HgB A1c     Genitourinary   Chronic kidney disease (CKD), stage III (moderate) (HCC)    Controlled.  Followed by Nephrology. Continue with Ozempic for kidney protection. Diabetes is well controlled. Labs ordered today. Will make recommendations based on lab results.        Other   Generalized anxiety disorder    Chronic. Not well controlled. On Cymbalta 9059maily due to Fibromyalgia and depression.  Hesitant to change medication.  Also on Wellbutirn 150m5mD. Will start Rexulti 0.25mg65mf tolerating well after 2 weeks can increase to 0.5mg d34my.  Follow up in 1 month. New referral placed for psychiatry.      Morbid obesity with body mass index of 40.0-49.9 (HCC)   Depression, recurrent (HCC)    Chronic. Not well  controlled. On Cymbalta 90mg d77m due to Fibromyalgia and depression.  Hesitant to change medication.  Also on Wellbutirn 150mg BI36mill start Rexulti 0.25mg.  I39mlerating well after 2 weeks can increase to 0.5mg daily33mFollow up in 1 month. New referral placed for psychiatry.      Other Visit Diagnoses     Mixed hyperlipidemia       Relevant Medications   lisinopril (ZESTRIL) 20 MG tablet        Follow up plan: Return in about 1 month (around 12/13/2021) for Depression/Anxiety FU.

## 2021-11-15 NOTE — Assessment & Plan Note (Signed)
Chronic. Not well controlled. On Cymbalta 77m daily due to Fibromyalgia and depression.  Hesitant to change medication.  Also on Wellbutirn 1576mBID. Will start Rexulti 0.2525m If tolerating well after 2 weeks can increase to 0.5mg39mily.  Follow up in 1 month. New referral placed for psychiatry.

## 2021-11-15 NOTE — Assessment & Plan Note (Signed)
Chronic.  Controlled.  Continue with current medication regimen of Atenolol 15m, Amlodipine 540m and Lisinopril 2035mRefilled Lisinopril today. Labs ordered today.  Return to clinic in 3 months for reevaluation.  Call sooner if concerns arise.

## 2021-11-15 NOTE — Assessment & Plan Note (Signed)
Chronic.  Controlled.  Continue with current medication regimen.  Labs ordered today.  Return to clinic in 3 months for reevaluation.  Call sooner if concerns arise.

## 2021-11-15 NOTE — Assessment & Plan Note (Signed)
Controlled.  Followed by Nephrology. Continue with Ozempic for kidney protection. Diabetes is well controlled. Labs ordered today. Will make recommendations based on lab results.

## 2021-11-16 ENCOUNTER — Telehealth: Payer: Self-pay | Admitting: Nurse Practitioner

## 2021-11-16 LAB — COMPREHENSIVE METABOLIC PANEL
ALT: 21 IU/L (ref 0–32)
AST: 21 IU/L (ref 0–40)
Albumin/Globulin Ratio: 2.4 — ABNORMAL HIGH (ref 1.2–2.2)
Albumin: 4.3 g/dL (ref 3.8–4.8)
Alkaline Phosphatase: 102 IU/L (ref 44–121)
BUN/Creatinine Ratio: 8 — ABNORMAL LOW (ref 12–28)
BUN: 13 mg/dL (ref 8–27)
Bilirubin Total: 0.3 mg/dL (ref 0.0–1.2)
CO2: 20 mmol/L (ref 20–29)
Calcium: 8.9 mg/dL (ref 8.7–10.3)
Chloride: 109 mmol/L — ABNORMAL HIGH (ref 96–106)
Creatinine, Ser: 1.57 mg/dL — ABNORMAL HIGH (ref 0.57–1.00)
Globulin, Total: 1.8 g/dL (ref 1.5–4.5)
Glucose: 95 mg/dL (ref 70–99)
Potassium: 4.2 mmol/L (ref 3.5–5.2)
Sodium: 139 mmol/L (ref 134–144)
Total Protein: 6.1 g/dL (ref 6.0–8.5)
eGFR: 37 mL/min/{1.73_m2} — ABNORMAL LOW (ref >59)

## 2021-11-16 LAB — LIPID PANEL
Chol/HDL Ratio: 2.3 ratio (ref 0.0–4.4)
Cholesterol, Total: 145 mg/dL (ref 100–199)
HDL: 62 mg/dL (ref >39)
LDL Chol Calc (NIH): 65 mg/dL (ref 0–99)
Triglycerides: 96 mg/dL (ref 0–149)
VLDL Cholesterol Cal: 18 mg/dL (ref 5–40)

## 2021-11-16 LAB — HEMOGLOBIN A1C
Est. average glucose Bld gHb Est-mCnc: 108 mg/dL
Hgb A1c MFr Bld: 5.4 % (ref 4.8–5.6)

## 2021-11-16 NOTE — Telephone Encounter (Signed)
Sent patient a MyChart message making patient aware of Jon Billings, NP recommendations. Advised patient to give our office call back if they have any questions.

## 2021-11-16 NOTE — Telephone Encounter (Signed)
Pt stated that medication needs PA pregabalin (LYRICA) 150 MG capsule,brexpiprazole (REXULTI) 0.25 MG TABS tablet Pt was advised by [place] that PA was faxed over yesterday afternoon.  Pt requesting a call back.

## 2021-11-16 NOTE — Progress Notes (Signed)
Please let patient know that her lab work looks good.  Kidney function remains consistent with prior.  We will continue to monitor in the future.  Her A1c remains well controlled.

## 2021-11-16 NOTE — Telephone Encounter (Signed)
PA for Lyrica initiated and submitted via Cover My Meds. Key: [id number]  PA for Rexulti initiated and submitted via Cover My Meds. Key: [id number]  Called and LVM letting patient know that both prior authorizations have been submitted and we are waiting on determination from her insurance.

## 2021-11-16 NOTE — Telephone Encounter (Signed)
Please call patient and let her know that [facility] would be a good place for her to be seen.  She would have to call herself and make an appt.  Please give her this phone number. [phone]. She will press option 2.

## 2021-11-17 ENCOUNTER — Telehealth: Payer: Self-pay | Admitting: Nurse Practitioner

## 2021-11-17 NOTE — Telephone Encounter (Signed)
Called and LVM notifying patient that both medications have been approved through her insurance.   OK for [facility] to give message if patient calls back.

## 2021-11-17 NOTE — Telephone Encounter (Signed)
Patient states she would like PCP to prescribe the generic of  brexpiprazole (REXULTI) 0.25 MG TABS tablet due to the cost being cheaper,  patient would like to know PCP thoughts. Patient  viewed a generic coupon on the rexulti website  that may assist with cost and also patient would like to know if PCP has [place] Phone:  [phone]  Fax:  [phone]

## 2021-11-17 NOTE — Telephone Encounter (Signed)
Pt is calling to report that she went to pick up the mediation brexpiprazole (REXULTI) 0.25 MG TABS tablet [[id number]] . Pt reports that this medication is $1400. Can something else be prescribed. Or any suggestions to reduce the cost of the medication.  CB- [phone]

## 2021-11-18 NOTE — Telephone Encounter (Signed)
If it is still not affordable will prescribe a different option.

## 2021-11-18 NOTE — Telephone Encounter (Signed)
Made patient aware of Karen's recommendations via [place]. Advised patient to give our office call.

## 2021-11-18 NOTE — Telephone Encounter (Signed)
Recommend patient use the coupon from the website to see what the cost would be.  The medication I sent is the generic.

## 2021-11-24 ENCOUNTER — Other Ambulatory Visit: Payer: Self-pay | Admitting: Nurse Practitioner

## 2021-11-24 NOTE — Telephone Encounter (Signed)
Requested Prescriptions  ?Pending Prescriptions Disp Refills  ?? buPROPion (WELLBUTRIN SR) 150 MG 12 hr tablet [Pharmacy Med Name: buPROPion HCl ER (SR) 150 MG Oral Tablet Extended Release 12 Hour] 60 tablet 0  ?  Sig: Take 1 tablet by mouth twice daily  ?  ? Psychiatry: Antidepressants - bupropion Failed - 11/24/2021 12:37 PM  ?  ?  Failed - Cr in normal range and within 360 days  ?  Creatinine  ?Date Value Ref Range Status  ?05/11/2013 0.97 0.60 - 1.30 mg/dL Final  ? ?Creatinine, Ser  ?Date Value Ref Range Status  ?11/15/2021 1.57 (H) 0.57 - 1.00 mg/dL Final  ?   ?  ?  Passed - AST in normal range and within 360 days  ?  AST  ?Date Value Ref Range Status  ?11/15/2021 21 0 - 40 IU/L Final  ?   ?  ?  Passed - ALT in normal range and within 360 days  ?  ALT  ?Date Value Ref Range Status  ?11/15/2021 21 0 - 32 IU/L Final  ?   ?  ?  Passed - Completed PHQ-2 or PHQ-9 in the last 360 days  ?  ?  Passed - Last BP in normal range  ?  BP Readings from Last 1 Encounters:  ?11/15/21 120/69  ?   ?  ?  Passed - Valid encounter within last 6 months  ?  Recent Outpatient Visits   ?      ? 1 week ago Essential (primary) hypertension  ? [place], NP  ? 4 months ago Essential (primary) hypertension  ? [place], NP  ? 7 months ago Generalized anxiety disorder  ? [place], NP  ? 10 months ago Depression, major, single episode, in partial remission ([place])  ? [place], NP  ? 11 months ago Annual physical exam  ? [facility] Jon Billings, NP  ?  ?  ?Future Appointments   ?        ? In 2 weeks Jon Billings, NP [facility], [facility]  ?  ? ?  ?  ?  ? ? ?

## 2021-12-03 ENCOUNTER — Other Ambulatory Visit: Payer: Self-pay | Admitting: Cardiovascular Disease

## 2021-12-03 ENCOUNTER — Other Ambulatory Visit: Payer: Self-pay | Admitting: Nurse Practitioner

## 2021-12-03 DIAGNOSIS — E782 Mixed hyperlipidemia: Secondary | ICD-10-CM

## 2021-12-05 NOTE — Telephone Encounter (Signed)
Please schedule overdue F/U appointment for refills. Patient canceled last scheduled F/U visit. Thank you! ?

## 2021-12-05 NOTE — Telephone Encounter (Signed)
Requested Prescriptions  ?Pending Prescriptions Disp Refills  ?? DULoxetine (CYMBALTA) 60 MG capsule [Pharmacy Med Name: DULoxetine HCl 60 MG Oral Capsule Delayed Release Particles] 90 capsule 0  ?  Sig: Take 1 capsule by mouth at bedtime  ?  ? Psychiatry: Antidepressants - SNRI - duloxetine Failed - 12/03/2021  2:36 AM  ?  ?  Failed - Cr in normal range and within 360 days  ?  Creatinine  ?Date Value Ref Range Status  ?05/11/2013 0.97 0.60 - 1.30 mg/dL Final  ? ?Creatinine, Ser  ?Date Value Ref Range Status  ?11/15/2021 1.57 (H) 0.57 - 1.00 mg/dL Final  ?   ?  ?  Passed - eGFR is 30 or above and within 360 days  ?  EGFR (African American)  ?Date Value Ref Range Status  ?05/11/2013 >60  Final  ? ?GFR calc Af Wyvonnia Lora  ?Date Value Ref Range Status  ?04/29/2020 46 (L) >60 mL/min Final  ? ?EGFR (Non-African Amer.)  ?Date Value Ref Range Status  ?05/11/2013 >60  Final  ?  Comment:  ?  eGFR values <45m/min/1.73 m2 may be an indication of chronic ?kidney disease (CKD). ?Calculated eGFR is useful in patients with stable renal function. ?The eGFR calculation will not be reliable in acutely ill patients ?when serum creatinine is changing rapidly. It is not useful in  ?patients on dialysis. The eGFR calculation may not be applicable ?to patients at the low and high extremes of body sizes, pregnant ?women, and vegetarians. ?  ? ?GFR calc non Af Amer  ?Date Value Ref Range Status  ?04/29/2020 40 (L) >60 mL/min Final  ? ?eGFR  ?Date Value Ref Range Status  ?11/15/2021 37 (L) >59 mL/min/1.73 Final  ?   ?  ?  Passed - Completed PHQ-2 or PHQ-9 in the last 360 days  ?  ?  Passed - Last BP in normal range  ?  BP Readings from Last 1 Encounters:  ?11/15/21 120/69  ?   ?  ?  Passed - Valid encounter within last 6 months  ?  Recent Outpatient Visits   ?      ? 2 weeks ago Essential (primary) hypertension  ? [place] NP  ? 4 months ago Essential (primary) hypertension  ? [place] NP  ? 8 months ago Generalized anxiety disorder  ? [place] NP  ? 10 months ago Depression, major, single episode, in partial remission ([place]  ? [place]  ? 1 year ago Annual physical exam  ? [facility]  ?  ?  ?Future Appointments   ?        ? In 1 week [facility]  ?  ? ?  ?  ?  ?? DULoxetine (CYMBALTA) 30 MG capsule [Pharmacy Med Name: DULoxetine HCl 30 MG Oral Capsule Delayed Release Particles] 90 capsule 0  ?  Sig: Take 1 capsule by mouth at bedtime  ?  ? Psychiatry: Antidepressants - SNRI - duloxetine Failed - 12/03/2021  2:36 AM  ?  ?  Failed - Cr in normal range and within 360 days  ?  Creatinine  ?Date Value Ref Range Status  ?05/11/2013 0.97 0.60 - 1.30 mg/dL Final  ? ?Creatinine, Ser  ?Date Value Ref Range Status  ?11/15/2021 1.57 (H) 0.57 - 1.00 mg/dL Final  ?   ?  ?  Passed -  eGFR is 30 or above and within 360 days  ?  EGFR (African American)  ?Date Value Ref Range Status  ?05/11/2013 >60  Final  ? ?GFR calc Af Wyvonnia Lora  ?Date Value Ref Range Status  ?04/29/2020 46 (L) >60 mL/min Final  ? ?EGFR (Non-African Amer.)  ?Date Value Ref Range Status  ?05/11/2013 >60  Final  ?  Comment:  ?  eGFR values <69m/min/1.73 m2 may be an indication of chronic ?kidney disease (CKD). ?Calculated eGFR is useful in patients with stable renal function. ?The eGFR calculation will not be reliable in acutely ill patients ?when serum creatinine is changing rapidly. It is not useful in  ?patients on dialysis. The eGFR calculation may not be applicable ?to patients at the low and high extremes of body sizes, pregnant ?women, and vegetarians. ?  ? ?GFR calc non Af Amer  ?Date Value Ref Range Status  ?04/29/2020 40 (L) >60 mL/min Final  ? ?eGFR  ?Date Value Ref Range Status  ?11/15/2021 37 (L) >59 mL/min/1.73 Final  ?   ?  ?  Passed - Completed PHQ-2 or PHQ-9 in the last 360 days  ?   ?  Passed - Last BP in normal range  ?  BP Readings from Last 1 Encounters:  ?11/15/21 120/69  ?   ?  ?  Passed - Valid encounter within last 6 months  ?  Recent Outpatient Visits   ?      ? 2 weeks ago Essential (primary) hypertension  ? [place] NP  ? 4 months ago Essential (primary) hypertension  ? [place] NP  ? 8 months ago Generalized anxiety disorder  ? [place] NP  ? 10 months ago Depression, major, single episode, in partial remission ([place]  ? [place] NP  ? 1 year ago Annual physical exam  ? [facility]  ?  ?  ?Future Appointments   ?        ? In 1 week [facility]  ?  ? ?  ?  ?  ? ?

## 2021-12-05 NOTE — Telephone Encounter (Signed)
Attempted to schedule no ans no vm  

## 2021-12-13 ENCOUNTER — Ambulatory Visit: Payer: 59 | Admitting: Nurse Practitioner

## 2021-12-13 ENCOUNTER — Other Ambulatory Visit: Payer: Self-pay

## 2021-12-13 DIAGNOSIS — E782 Mixed hyperlipidemia: Secondary | ICD-10-CM

## 2021-12-13 MED ORDER — TRAZODONE HCL 100 MG PO TABS: 200.0000 mg | ORAL_TABLET | Freq: Every evening | ORAL | 1 refills | Status: DC | PRN

## 2021-12-13 MED ORDER — LOVASTATIN 40 MG PO TABS: 40.0000 mg | ORAL_TABLET | Freq: Every day | ORAL | 1 refills | Status: DC

## 2021-12-13 MED ORDER — ATENOLOL 50 MG PO TABS: ORAL_TABLET | ORAL | 1 refills | Status: DC

## 2021-12-15 ENCOUNTER — Encounter: Payer: Self-pay | Admitting: Nurse Practitioner

## 2021-12-15 ENCOUNTER — Ambulatory Visit: Payer: Self-pay | Admitting: *Deleted

## 2021-12-15 DIAGNOSIS — Z23 Encounter for immunization: Secondary | ICD-10-CM

## 2021-12-15 NOTE — Telephone Encounter (Signed)
?  Chief Complaint: Gout flare up in left big toe ?Symptoms: pain in left big toe joint  Unable to take NSAIDS for the pain ?Frequency: Started last night ?Pertinent Negatives: Patient denies N/A ?Disposition: [] ED /[] Urgent Care (no appt availability in office) / [x] Appointment(In office/virtual)/ []  [facility]/ [] Home Care/ [] Refused Recommended Disposition /[] [facility]/ []  Follow-up with PCP ?Additional Notes: Encouraged her to try heat since the ice is not helping.   Also tart cherry juice.  Has appt tomorrow.  ?

## 2021-12-15 NOTE — Telephone Encounter (Signed)
LVM asking pt to call back  ?

## 2021-12-15 NOTE — Telephone Encounter (Signed)
Reason for Disposition ? Pain in the big toe joint ? ?Answer Assessment - Initial Assessment Questions ?1. ONSET: "When did the pain start?"  ?    My gout is flaring up   Started last night.  ?I sent a message to [facility].   They said I need an appt.   I have an appt for tomorrow.   ?I'm using cold ice packs.   Tylenol too.   He takes allopurinol my husband because he has gout too. ?I also have a sinus infection. ?2. LOCATION: "Where is the pain located?"   (e.g., around nail, entire toe, at foot joint)  ?    Left big toe joint ?3. PAIN: "How bad is the pain?"    (Scale 1-10; or mild, moderate, severe) ?  -  MILD (1-3): doesn't interfere with normal activities  ?  -  MODERATE (4-7): interferes with normal activities (e.g., work or school) or awakens from sleep, limping  ?  -  SEVERE (8-10): excruciating pain, unable to do any normal activities, unable to walk ?    *No Answer* ?4. APPEARANCE: "What does the toe look like?" (e.g., redness, swelling, bruising, pallor) ?    *No Answer* ?5. CAUSE: "What do you think is causing the toe pain?" ?    *No Answer* ?6. OTHER SYMPTOMS: "Do you have any other symptoms?" (e.g., leg pain, rash, fever, numbness) ?    *No Answer* ?7. PREGNANCY: "Is there any chance you are pregnant?" "When was your last menstrual period?" ?    *No Answer* ? ?Protocols used: Toe Pain-A-AH ? ?

## 2021-12-16 ENCOUNTER — Telehealth: Payer: 59 | Admitting: Nurse Practitioner

## 2021-12-20 ENCOUNTER — Other Ambulatory Visit: Payer: Self-pay

## 2021-12-20 ENCOUNTER — Ambulatory Visit (INDEPENDENT_AMBULATORY_CARE_PROVIDER_SITE_OTHER): Payer: 59 | Admitting: Nurse Practitioner

## 2021-12-20 ENCOUNTER — Encounter: Payer: Self-pay | Admitting: Nurse Practitioner

## 2021-12-20 VITALS — BP 123/68 | HR 63 | Temp 97.9°F | Wt 227.2 lb

## 2021-12-20 DIAGNOSIS — R69 Illness, unspecified: Secondary | ICD-10-CM | POA: Diagnosis not present

## 2021-12-20 DIAGNOSIS — F419 Anxiety disorder, unspecified: Secondary | ICD-10-CM | POA: Insufficient documentation

## 2021-12-20 DIAGNOSIS — F339 Major depressive disorder, recurrent, unspecified: Secondary | ICD-10-CM

## 2021-12-20 MED ORDER — OZEMPIC (0.25 OR 0.5 MG/DOSE) 2 MG/1.5ML ~~LOC~~ SOPN: 0.2500 mg | PEN_INJECTOR | SUBCUTANEOUS | 1 refills | Status: DC

## 2021-12-20 MED ORDER — BREXPIPRAZOLE 0.5 MG PO TABS: 0.5000 mg | ORAL_TABLET | Freq: Every day | ORAL | 1 refills | Status: DC

## 2021-12-20 NOTE — Progress Notes (Signed)
? ?BP 123/68   Pulse 63   Temp 97.9 ?F (36.6 ?C) (Oral)   Wt 227 lb 3.2 oz (103.1 kg)   SpO2 96%   BMI 37.85 kg/m?   ? ?Subjective:  ? ? Patient ID: Angela Mullen, female    DOB: 20-Dec-1955, 66 y.o.   MRN: [id number] ? ?HPI: ?Angela Mullen is a 66 y.o. female ? ?Chief Complaint  ?Patient presents with  ? Depression  ? Anxiety  ? ?DEPRESSION/ANXIETY ?Patient states she is feeling much better.  She is taking the Rexulti which is helping her mood.  She is also taking Cymbalta and Wellbutrin 155m. She feels like this is a good regimen for her.  Denies concerns at visit today.   ? ? ?[place] ?PHQ-9 Total Score 8  ? ?  ? ? ?  12/20/2021  ?  8:27 AM 11/15/2021  ?  9:29 AM 04/05/2021  ?  9:42 AM  ?GAD 7 : Generalized Anxiety Score  ?Nervous, Anxious, on Edge _0 ?Control/stop worrying 0 1 0  ?Worry too much - different things _1 ?Trouble relaxing 0 1 0  ?Restless 1 1 0  ?Easily annoyed or irritable 0 1 1  ?Afraid - awful might happen 0 0 0  ?Total GAD 7 Score _2 ?Anxiety Difficulty Somewhat difficult Somewhat difficult Not difficult at all  ? ? ? ?Relevant past medical, surgical, family and social history reviewed and updated as indicated. Interim medical history since our last visit reviewed. ?Allergies and medications reviewed and updated. ? ?Review of Systems  ?Psychiatric/Behavioral:  Positive for dysphoric mood. Negative for suicidal ideas. The patient is nervous/anxious.   ? ?Per HPI unless specifically indicated above ? ?   ?Objective:  ?  ?BP 123/68   Pulse 63   Temp 97.9 ?F (36.6 ?C) (Oral)   Wt 227 lb 3.2 oz (103.1 kg)   SpO2 96%   BMI 37.85 kg/m?   ?Wt Readings from Last 3 Encounters:  ?12/20/21 227 lb 3.2 oz (103.1 kg)  ?11/15/21 222 lb 9.6 oz (101 kg)  ?07/20/21 223 lb 9.6 oz (101.4 kg)  ?  ?Physical Exam ?Vitals and nursing note reviewed.  ?Constitutional:   ?   General: She is not in acute distress. ?   Appearance: Normal  appearance. She is obese. She is not ill-appearing, toxic-appearing or diaphoretic.  ?HENT:  ?   Head: Normocephalic.  ?   Right Ear: External ear normal.  ?   Left Ear: External ear normal.  ?   Nose: Nose normal.  ?   Mouth/Throat:  ?   Mouth: Mucous membranes are moist.  ?   Pharynx: Oropharynx is clear.  ?Eyes:  ?   General:     ?   Right eye: No discharge.     ?   Left eye: No discharge.  ?   Extraocular Movements: Extraocular movements intact.  ?   Conjunctiva/sclera: Conjunctivae normal.  ?   Pupils: Pupils are equal, round, and reactive to light.  ?Cardiovascular:  ?   Rate and Rhythm: Normal rate and regular rhythm.  ?   Heart sounds: No murmur heard. ?Pulmonary:  ?   Effort: Pulmonary effort is normal. No respiratory distress.  ?   Breath sounds: Normal breath sounds. No wheezing or rales.  ?Musculoskeletal:  ?   Cervical back: Normal range of motion and neck supple.  ?Skin: ?  General: Skin is warm and dry.  ?   Capillary Refill: Capillary refill takes less than 2 seconds.  ?Neurological:  ?   General: No focal deficit present.  ?   Mental Status: She is alert and oriented to person, place, and time. Mental status is at baseline.  ?Psychiatric:     ?   Mood and Affect: Mood normal.     ?   Behavior: Behavior normal.     ?   Thought Content: Thought content normal.     ?   Judgment: Judgment normal.  ? ? ?Results for orders placed or performed in visit on 11/15/21  ?Comp Met (CMET)  ?Result Value Ref Range  ? Glucose 95 70 - 99 mg/dL  ? BUN 13 8 - 27 mg/dL  ? Creatinine, Ser 1.57 (H) 0.57 - 1.00 mg/dL  ? eGFR 37 (L) >59 mL/min/1.73  ? BUN/Creatinine Ratio 8 (L) 12 - 28  ? Sodium 139 134 - 144 mmol/L  ? Potassium 4.2 3.5 - 5.2 mmol/L  ? Chloride 109 (H) 96 - 106 mmol/L  ? CO2 20 20 - 29 mmol/L  ? Calcium 8.9 8.7 - 10.3 mg/dL  ? Total Protein 6.1 6.0 - 8.5 g/dL  ? Albumin 4.3 3.8 - 4.8 g/dL  ? Globulin, Total 1.8 1.5 - 4.5 g/dL  ? Albumin/Globulin Ratio 2.4 (H) 1.2 - 2.2  ? Bilirubin Total 0.3 0.0 - 1.2  mg/dL  ? Alkaline Phosphatase 102 44 - 121 IU/L  ? AST 21 0 - 40 IU/L  ? ALT 21 0 - 32 IU/L  ?Lipid Profile  ?Result Value Ref Range  ? Cholesterol, Total 145 100 - 199 mg/dL  ? Triglycerides 96 0 - 149 mg/dL  ? HDL 62 >39 mg/dL  ? VLDL Cholesterol Cal 18 5 - 40 mg/dL  ? LDL Chol Calc (NIH) 65 0 - 99 mg/dL  ? Chol/HDL Ratio 2.3 0.0 - 4.4 ratio  ?HgB A1c  ?Result Value Ref Range  ? Hgb A1c MFr Bld 5.4 4.8 - 5.6 %  ? Est. average glucose Bld gHb Est-mCnc 108 mg/dL  ? ?   ?Assessment & Plan:  ? ?Problem List Items Addressed This Visit   ? ?  ? Other  ? Depression, recurrent (HCC) - Primary  ?  Chronic.  Controlled.  Continue with current medication regimen of Cymbalta, Wellbutrin and Rexulti.  Patient is taking Rexulti 0.5mg daily.  Feels like this is a good dose for her and doesn't want to change.  She will be going on Medicare in May, if this is not covered, will change to [place].  Return to clinic in 3 months for reevaluation.  Call sooner if concerns arise.  ? ?  ?  ? Anxiety  ?  Chronic.  Controlled.  Continue with current medication regimen of Cymbalta, Wellbutrin and Rexulti.  Patient is taking Rexulti 0.5mg daily.  Feels like this is a good dose for her and doesn't want to change.  She will be going on Medicare in May, if this is not covered, will change to [place].  Return to clinic in 3 months for reevaluation.  Call sooner if concerns arise.  ? ?  ?  ?  ? ?Follow up plan: ?Return in about 3 months (around 03/22/2022) for Depression/Anxiety FU, HTN, HLD, DM2 FU. ? ? ? ? ? ?

## 2021-12-20 NOTE — Assessment & Plan Note (Signed)
Chronic.  Controlled.  Continue with current medication regimen of Cymbalta, Wellbutrin and Rexulti.  Patient is taking Rexulti 0.33m daily.  Feels like this is a good dose for her and doesn't want to change.  She will be going on Medicare in May, if this is not covered, will change [place]  Return to clinic in 3 months for reevaluation.  Call sooner if concerns arise.  ? ?

## 2021-12-20 NOTE — Assessment & Plan Note (Signed)
Chronic.  Controlled.  Continue with current medication regimen of Cymbalta, Wellbutrin and Rexulti.  Patient is taking Rexulti 0.54m daily.  Feels like this is a good dose for her and doesn't want to change.  She will be going on Medicare in May, if this is not covered, will change to [place]  Return to clinic in 3 months for reevaluation.  Call sooner if concerns arise.  ? ?

## 2021-12-22 ENCOUNTER — Ambulatory Visit: Payer: 59 | Admitting: Nurse Practitioner

## 2021-12-25 ENCOUNTER — Other Ambulatory Visit: Payer: Self-pay | Admitting: Nurse Practitioner

## 2021-12-26 ENCOUNTER — Telehealth: Payer: Self-pay | Admitting: Nurse Practitioner

## 2021-12-26 ENCOUNTER — Other Ambulatory Visit: Payer: Self-pay | Admitting: Nurse Practitioner

## 2021-12-26 MED ORDER — BUPROPION HCL ER (SR) 150 MG PO TB12: 150.0000 mg | ORAL_TABLET | Freq: Two times a day (BID) | ORAL | 1 refills | Status: DC

## 2021-12-26 NOTE — Telephone Encounter (Signed)
Patient seen 12/20/21 and has appointment 03/22/22 ?

## 2021-12-26 NOTE — Telephone Encounter (Signed)
PA for Ozempic initiated and submitted via Cover My Meds. Key: [id number] ?

## 2021-12-26 NOTE — Telephone Encounter (Signed)
Copied from [place] [id number]. Topic: General - Other ?>> Dec 26, 2021  7:53 AM Holley Dexter N wrote: ?Reason for CRM: Pt called in about her  Semaglutide,0.25 or 0.5MG/DOS, (OZEMPIC, 0.25 OR 0.5 MG/DOSE,) 2 MG/1.5ML SOPN  medication stating the pharmacy has still not received it, pt requesting if someone can look into it, I do see were on 03/28 it was sent over, but they are telling her otherwise. ?

## 2021-12-27 ENCOUNTER — Ambulatory Visit: Payer: 59

## 2021-12-27 NOTE — Telephone Encounter (Signed)
rx was signed yesterday by provider #[id number]  ?Receipt confirmed by pharmacy (12/26/2021 10:41 AM EDT ?Requested Prescriptions  ?Refused Prescriptions Disp Refills  ?? buPROPion (WELLBUTRIN SR) 150 MG 12 hr tablet [Pharmacy Med Name: buPROPion HCl ER (SR) 150 MG Oral Tablet Extended Release 12 Hour] 60 tablet 0  ?  Sig: Take 1 tablet by mouth twice daily  ?  ? Psychiatry: Antidepressants - bupropion Failed - 12/25/2021  7:14 AM  ?  ?  Failed - Cr in normal range and within 360 days  ?  Creatinine  ?Date Value Ref Range Status  ?05/11/2013 0.97 0.60 - 1.30 mg/dL Final  ? ?Creatinine, Ser  ?Date Value Ref Range Status  ?11/15/2021 1.57 (H) 0.57 - 1.00 mg/dL Final  ?   ?  ?  Passed - AST in normal range and within 360 days  ?  AST  ?Date Value Ref Range Status  ?11/15/2021 21 0 - 40 IU/L Final  ?   ?  ?  Passed - ALT in normal range and within 360 days  ?  ALT  ?Date Value Ref Range Status  ?11/15/2021 21 0 - 32 IU/L Final  ?   ?  ?  Passed - Completed PHQ-2 or PHQ-9 in the last 360 days  ?  ?  Passed - Last BP in normal range  ?  BP Readings from Last 1 Encounters:  ?12/20/21 123/68  ?   ?  ?  Passed - Valid encounter within last 6 months  ?  Recent Outpatient Visits   ?      ? 1 week ago Depression, recurrent ([place])  ? [place], NP  ? 1 month ago Essential (primary) hypertension  ? [place], NP  ? 5 months ago Essential (primary) hypertension  ? [place], NP  ? 8 months ago Generalized anxiety disorder  ? [place], NP  ? 11 months ago Depression, major, single episode, in partial remission ([place])  ? [facility] Jon Billings, NP  ?  ?  ?Future Appointments   ?        ? In 4 weeks Gollan, Kathlene November, MD [facility]  ? In 2 months Jon Billings, NP [facility], [facility]  ?  ? ?  ?  ?  ? ? ?

## 2021-12-28 ENCOUNTER — Ambulatory Visit: Payer: 59 | Admitting: Nurse Practitioner

## 2021-12-28 ENCOUNTER — Telehealth: Payer: Self-pay | Admitting: Nurse Practitioner

## 2021-12-28 MED ORDER — TRULICITY 0.75 MG/0.5ML ~~LOC~~ SOAJ: 0.7500 mg | SUBCUTANEOUS | 0 refills | Status: DC

## 2021-12-28 NOTE — Telephone Encounter (Signed)
Prescription was changed by provider. ?

## 2021-12-28 NOTE — Addendum Note (Signed)
Addended by: Jon Billings on: 12/28/2021 11:21 AM ? ? Modules accepted: Orders ? ?

## 2021-12-28 NOTE — Telephone Encounter (Signed)
Angela Mullen did a PA for pts Semaglutide,0.25 or 0.5MG/DOS, (OZEMPIC, 0.25 OR 0.5 MG/DOSE,) 2 MG/1.5ML SOPN/ but now they would like an appeal to get more information / they advised that they need the clinical condition or Dr needs a specific dosage form and/or member has tried alternatives ? ?Fax# [phone] ? ?/please advise pt when this has been done / pt stated she has about  weeks of medication left before she will need  ?

## 2021-12-28 NOTE — Telephone Encounter (Signed)
Left message for patient to give our office a call back to discuss recent changes by provider.  ? ?OK for pec/nurse triage to give note if patient calls back.  ?

## 2021-12-28 NOTE — Telephone Encounter (Signed)
PA for Ozempic denied. Formulary alternatives are Trulicity and Victoza. Could one of these be appropriate for the patient? ?

## 2021-12-30 ENCOUNTER — Telehealth: Payer: Self-pay

## 2021-12-30 NOTE — Telephone Encounter (Signed)
PA for Trulicity initiated and submitted via Cover My Meds. Key: [id number] ?

## 2022-01-04 NOTE — Telephone Encounter (Signed)
PA approved, patient notified via mychart message.  ?

## 2022-01-12 ENCOUNTER — Other Ambulatory Visit: Payer: Self-pay | Admitting: Nurse Practitioner

## 2022-01-13 NOTE — Telephone Encounter (Signed)
Requested medication (s) are due for refill today: yes ? ?Requested medication (s) are on the active medication list: yes ? ?Last refill:  10/05/21 ? ?Future visit scheduled: yes ? ?Notes to clinic:  Unable to refill per protocol, cannot delegate no protocol attached to rx request. ? ? ?  ?Requested Prescriptions  ?Pending Prescriptions Disp Refills  ? clotrimazole-betamethasone (LOTRISONE) cream [Pharmacy Med Name: Clotrimazole-Betamethasone 1-0.05 % External Cream] 30 g 0  ?  Sig: APPLY  CREAM TOPICALLY TWICE DAILY AS NEEDED  ?  ? Off-Protocol Failed - 01/12/2022  5:31 PM  ?  ?  Failed - Medication not assigned to a protocol, review manually.  ?  ?  Passed - Valid encounter within last 12 months  ?  Recent Outpatient Visits   ? ?      ? 3 weeks ago Depression, recurrent ([place])  ? [place], NP  ? 1 month ago Essential (primary) hypertension  ? [place], NP  ? 5 months ago Essential (primary) hypertension  ? [place], NP  ? 9 months ago Generalized anxiety disorder  ? [place], NP  ? 1 year ago Depression, major, single episode, in partial remission ([place])  ? [facility] Jon Billings, NP  ? ?  ?  ?Future Appointments   ? ?        ? In 1 week Gollan, Kathlene November, MD [facility], [facility]  ? In 2 months Jon Billings, NP [facility], [facility]  ? ?  ? ? ?  ?  ?  ? ? ?

## 2022-01-18 ENCOUNTER — Ambulatory Visit: Payer: 59 | Admitting: Nurse Practitioner

## 2022-01-18 ENCOUNTER — Other Ambulatory Visit: Payer: Self-pay | Admitting: Nurse Practitioner

## 2022-01-18 ENCOUNTER — Ambulatory Visit: Payer: Self-pay | Admitting: *Deleted

## 2022-01-18 ENCOUNTER — Telehealth (INDEPENDENT_AMBULATORY_CARE_PROVIDER_SITE_OTHER): Payer: 59 | Admitting: Internal Medicine

## 2022-01-18 ENCOUNTER — Encounter: Payer: Self-pay | Admitting: Internal Medicine

## 2022-01-18 DIAGNOSIS — R052 Subacute cough: Secondary | ICD-10-CM | POA: Diagnosis not present

## 2022-01-18 DIAGNOSIS — B001 Herpesviral vesicular dermatitis: Secondary | ICD-10-CM

## 2022-01-18 MED ORDER — FEXOFENADINE HCL 180 MG PO TABS: 180.0000 mg | ORAL_TABLET | Freq: Every day | ORAL | 1 refills | Status: DC

## 2022-01-18 MED ORDER — VALACYCLOVIR HCL 1 G PO TABS: 2000.0000 mg | ORAL_TABLET | Freq: Two times a day (BID) | ORAL | 0 refills | Status: AC

## 2022-01-18 NOTE — Telephone Encounter (Signed)
?  Chief Complaint: cough takes breath away  ?Symptoms: productive cough clear to white sputum, cough with shortness of breath . Saturday 01/14/22 c/o headache, chills, Sunday 01/15/22 cough started tested for covid Monday negative results with at home test . ?Frequency: 01/15/22 ?Pertinent Negatives: Patient denies chest pain dizziness, fever, difficulty breathing ?Disposition: [] ED /[] Urgent Care (no appt availability in office) / [x] Appointment(In office/virtual)/ []  [facility]/ [] Home Care/ [] Refused Recommended Disposition /[] [facility]/ []  Follow-up with PCP ?Additional Notes:  ? ?My Chart VV scheduled for today ? ? Reason for Disposition ? [1] Continuous (nonstop) coughing interferes with work or school AND [2] no improvement using cough treatment per Care Advice ? ?Answer Assessment - Initial Assessment Questions ?1. ONSET: "When did the cough begin?"  ?    Sunday 01/15/22 ?2. SEVERITY: "How bad is the cough today?"  ?    Coughing and feels like taking breath away  ?3. SPUTUM: "Describe the color of your sputum" (none, dry cough; clear, white, yellow, green) ?    Clear to white  ?4. HEMOPTYSIS: "Are you coughing up any blood?" If so ask: "How much?" (flecks, streaks, tablespoons, etc.) ?    no ?5. DIFFICULTY BREATHING: "Are you having difficulty breathing?" If Yes, ask: "How bad is it?" (e.g., mild, moderate, severe)  ?  - MILD: No SOB at rest, mild SOB with walking, speaks normally in sentences, can lie down, no retractions, pulse < 100.  ?  - MODERATE: SOB at rest, SOB with minimal exertion and prefers to sit, cannot lie down flat, speaks in phrases, mild retractions, audible wheezing, pulse 100-120.  ?  - SEVERE: Very SOB at rest, speaks in single words, struggling to breathe, sitting hunched forward, retractions, pulse > 120  ?    Denies shortness of breath after coughing  ?6. FEVER: "Do you have a fever?" If Yes, ask: "What is your temperature, how was it measured, and when did it  start?" ?    no ?7. CARDIAC HISTORY: "Do you have any history of heart disease?" (e.g., heart attack, congestive heart failure)  ?    Hx cardiac issues , HTN diabetic  ?8. LUNG HISTORY: "Do you have any history of lung disease?"  (e.g., pulmonary embolus, asthma, emphysema) ?    na ?9. PE RISK FACTORS: "Do you have a history of blood clots?" (or: recent major surgery, recent prolonged travel, bedridden) ?    na ?10. OTHER SYMPTOMS: "Do you have any other symptoms?" (e.g., runny nose, wheezing, chest pain) ?      Blows nose for green mucus x 1  ?11. PREGNANCY: "Is there any chance you are pregnant?" "When was your last menstrual period?" ?      na ?12. TRAVEL: "Have you traveled out of the country in the last month?" (e.g., travel history, exposures) ?      na ? ?Protocols used: Cough - Acute Productive-A-AH ? ?

## 2022-01-18 NOTE — Progress Notes (Signed)
? ?There were no vitals taken for this visit.  ? ?Subjective:  ? ? Patient ID: Angela Mullen, female    DOB: 1956/06/08, 66 y.o.   MRN: [id number] ? ?Chief Complaint  ?Patient presents with  ?? fever blister  ?  Fever blister on top middle lip, started yesterday.   ? ? ?HPI: ?Angela Mullen is a 66 y.o. female ? ? ?This visit was completed via telephone due to the restrictions of the COVID-19 pandemic. All issues as above were discussed and addressed but no physical exam was performed. If it was felt that the patient should be evaluated in the office, they were directed there. The patient verbally consented to this visit. Patient was unable to complete an audio/visual visit due to Technical difficulties. Due to the catastrophic nature of the COVID-19 pandemic, this visit was done through audio contact only. ?Location of the patient: home ?Location of the provider: work ?Those involved with this call:  ?Provider: Charlynne Cousins, MD ?CMA: Frazier Butt, CMA ?Front Desk/Registration: [employer]  ?Time spent on call: 10 minutes on the phone discussing health concerns. 10 minutes total spent in review of patient's record and preparation of their chart. ? ? ? ?Mouth Lesions  ?The current episode started 2 days ago. Associated symptoms include congestion, mouth sores and cough. Pertinent negatives include no decreased vision, no double vision, no eye itching, no photophobia, no ear discharge, no ear pain, no headaches, no hearing loss, no rhinorrhea, no sore throat, no stridor, no swollen glands, no eye discharge, no eye pain and no eye redness.  ?Cough ?This is a new (tried orajel and otc meds didnt help) problem. The problem has been waxing and waning. The cough is Productive of sputum. Pertinent negatives include no chills, ear congestion, ear pain, eye redness, headaches, heartburn, hemoptysis, rhinorrhea, sore throat, shortness of breath or sweats. The symptoms are aggravated by cold air.  ? ?Chief Complaint   ?Patient presents with  ?? fever blister  ?  Fever blister on top middle lip, started yesterday.   ? ? ?Relevant past medical, surgical, family and social history reviewed and updated as indicated. Interim medical history since our last visit reviewed. ?Allergies and medications reviewed and updated. ? ?Review of Systems  ?Constitutional:  Negative for chills.  ?HENT:  Positive for congestion and mouth sores. Negative for ear discharge, ear pain, hearing loss, rhinorrhea and sore throat.   ?Eyes:  Negative for double vision, photophobia, pain, discharge, redness and itching.  ?Respiratory:  Positive for cough. Negative for hemoptysis, shortness of breath and stridor.   ?Gastrointestinal:  Negative for heartburn.  ?Neurological:  Negative for headaches.  ? ?Per HPI unless specifically indicated above ? ?   ?Objective:  ?  ?There were no vitals taken for this visit.  ?Wt Readings from Last 3 Encounters:  ?12/20/21 227 lb 3.2 oz (103.1 kg)  ?11/15/21 222 lb 9.6 oz (101 kg)  ?07/20/21 223 lb 9.6 oz (101.4 kg)  ?  ?Physical Exam ? ?Results for orders placed or performed in visit on 11/15/21  ?Comp Met (CMET)  ?Result Value Ref Range  ? Glucose 95 70 - 99 mg/dL  ? BUN 13 8 - 27 mg/dL  ? Creatinine, Ser 1.57 (H) 0.57 - 1.00 mg/dL  ? eGFR 37 (L) >59 mL/min/1.73  ? BUN/Creatinine Ratio 8 (L) 12 - 28  ? Sodium 139 134 - 144 mmol/L  ? Potassium 4.2 3.5 - 5.2 mmol/L  ? Chloride 109 (H) 96 - 106  mmol/L  ? CO2 20 20 - 29 mmol/L  ? Calcium 8.9 8.7 - 10.3 mg/dL  ? Total Protein 6.1 6.0 - 8.5 g/dL  ? Albumin 4.3 3.8 - 4.8 g/dL  ? Globulin, Total 1.8 1.5 - 4.5 g/dL  ? Albumin/Globulin Ratio 2.4 (H) 1.2 - 2.2  ? Bilirubin Total 0.3 0.0 - 1.2 mg/dL  ? Alkaline Phosphatase 102 44 - 121 IU/L  ? AST 21 0 - 40 IU/L  ? ALT 21 0 - 32 IU/L  ?Lipid Profile  ?Result Value Ref Range  ? Cholesterol, Total 145 100 - 199 mg/dL  ? Triglycerides 96 0 - 149 mg/dL  ? HDL 62 >39 mg/dL  ? VLDL Cholesterol Cal 18 5 - 40 mg/dL  ? LDL Chol Calc (NIH) 65 0  - 99 mg/dL  ? Chol/HDL Ratio 2.3 0.0 - 4.4 ratio  ?HgB A1c  ?Result Value Ref Range  ? Hgb A1c MFr Bld 5.4 4.8 - 5.6 %  ? Est. average glucose Bld gHb Est-mCnc 108 mg/dL  ? ?   ? ? ?Current Outpatient Medications:  ??  acetaminophen (TYLENOL) 500 MG tablet, Take 1,000 mg by mouth every 6 (six) hours as needed for moderate pain. , Disp: , Rfl:  ??  ADVAIR HFA 115-21 MCG/ACT inhaler, INHALE 2 PUFFS BY MOUTH EVERY 12 HOURS, Disp: 3 Inhaler, Rfl: 3 ??  albuterol (PROVENTIL) (2.5 MG/3ML) 0.083% nebulizer solution, Take 3 mLs (2.5 mg total) by nebulization every 6 (six) hours as needed for wheezing or shortness of breath., Disp: 270 mL, Rfl: 3 ??  albuterol (VENTOLIN HFA) 108 (90 Base) MCG/ACT inhaler, Inhale 1-2 puffs into the lungs every 6 (six) hours as needed for wheezing or shortness of breath., Disp: 54 g, Rfl: 3 ??  amLODipine (NORVASC) 5 MG tablet, Take 1 tablet by mouth once daily, Disp: 90 tablet, Rfl: 3 ??  atenolol (TENORMIN) 50 MG tablet, Take 1 tablet (33m) in the morning and 2 tablets (1047m in the evening., Disp: 90 tablet, Rfl: 1 ??  Brexpiprazole (REXULTI) 0.5 MG TABS, Take 1 tablet (0.5 mg total) by mouth daily., Disp: 90 tablet, Rfl: 1 ??  buPROPion (WELLBUTRIN SR) 150 MG 12 hr tablet, Take 1 tablet (150 mg total) by mouth 2 (two) times daily., Disp: 180 tablet, Rfl: 1 ??  clotrimazole-betamethasone (LOTRISONE) cream, APPLY  CREAM TOPICALLY TWICE DAILY AS NEEDED, Disp: 30 g, Rfl: 0 ??  Dulaglutide (TRULICITY) 0.0.96GGE/3.6OQOPN, Inject 0.75 mg into the skin once a week., Disp: 3 mL, Rfl: 0 ??  DULoxetine (CYMBALTA) 30 MG capsule, Take 1 capsule by mouth at bedtime, Disp: 90 capsule, Rfl: 0 ??  DULoxetine (CYMBALTA) 60 MG capsule, Take 1 capsule by mouth at bedtime, Disp: 90 capsule, Rfl: 0 ??  lisinopril (ZESTRIL) 20 MG tablet, Take 1 tablet (20 mg total) by mouth daily. NO FURTHER REFILLS UNTIL SEEN IN CLINIC PLEASE CALL OFFICE TO SCHEDULE AN APPOINTMENT [phone]: 90 tablet, Rfl: 1 ??   lovastatin (MEVACOR) 40 MG tablet, Take 1 tablet (40 mg total) by mouth at bedtime., Disp: 90 tablet, Rfl: 1 ??  methocarbamol (ROBAXIN) 750 MG tablet, TAKE 1 TABLET (750 MG TOTAL) BY MOUTH EVERY 8 (EIGHT) HOURS AS NEEDED., Disp: 90 tablet, Rfl: 0 ??  omeprazole (PRILOSEC) 40 MG capsule, Take 1 capsule (40 mg total) by mouth daily., Disp: 90 capsule, Rfl: 1 ??  pregabalin (LYRICA) 150 MG capsule, Take 2 capsules (300 mg total) by mouth daily., Disp: 60 capsule, Rfl: 2 ??  traZODone (DESYREL) 100 MG tablet, Take 2 tablets (200 mg total) by mouth at bedtime as needed for sleep., Disp: 180 tablet, Rfl: 1 ??  fluticasone-salmeterol (ADVAIR HFA) 115-21 MCG/ACT inhaler, Inhale 2 puffs into the lungs 2 (two) times daily as needed. (Patient not taking: Reported on 01/18/2022), Disp: , Rfl:  ??  tiotropium (SPIRIVA) 18 MCG inhalation capsule, Place 18 mcg into inhaler and inhale daily as needed. (Patient not taking: Reported on 01/18/2022), Disp: , Rfl:   ? ? ?Assessment & Plan:  ?Cold sores on lips will start pt on valtrex 2000 mg bid x 1 day if recurs will need further tx.  ? ?Subacute cough ? Allergic post infectious will start pt on allegra for such given no signs or symptoms of an infection ?Problem List Items Addressed This Visit   ?None ?  ? ?No orders of the defined types were placed in this encounter. ?  ? ?No orders of the defined types were placed in this encounter. ?  ? ?Follow up plan: ?No follow-ups on file. ? ? ?

## 2022-01-19 NOTE — Telephone Encounter (Signed)
Rx; 01/13/22 30g ?Requested Prescriptions  ?Pending Prescriptions Disp Refills  ?? clotrimazole-betamethasone (LOTRISONE) cream [Pharmacy Med Name: Clotrimazole-Betamethasone 1-0.05 % External Cream] 30 g 0  ?  Sig: APPLY  CREAM TOPICALLY TWICE DAILY AS NEEDED  ?  ? Off-Protocol Failed - 01/18/2022  8:16 AM  ?  ?  Failed - Medication not assigned to a protocol, review manually.  ?  ?  Passed - Valid encounter within last 12 months  ?  Recent Outpatient Visits   ?      ? Yesterday Cold sore  ? [facility] [place], [place], MD  ? 1 month ago Depression, recurrent ([place])  ? [place], NP  ? 2 months ago Essential (primary) hypertension  ? [place], NP  ? 6 months ago Essential (primary) hypertension  ? [place], NP  ? 9 months ago Generalized anxiety disorder  ? [facility] Jon Billings, NP  ?  ?  ?Future Appointments   ?        ? In 5 days Gollan, Kathlene November, MD [facility], [facility]  ? In 2 months Jon Billings, NP [facility], [facility]  ?  ? ?  ?  ?  ? ?

## 2022-01-23 NOTE — Progress Notes (Signed)
?  ?  ? ? ? ?Date:  01/24/2022  ? ?ID:  Angela Mullen, DOB 1955/11/21, MRN [id number] ? ?Patient Location:  ?[place] [address] ?[place] [id number]  ? ?Provider location:   ?[place], [employer] office ? ?PCP:  Jon Billings, NP  ?Cardiologist:  [facility] ? ?Chief Complaint  ?Patient presents with  ? 4 month follow up   ?  Patient c/o shortness of breath. Medications reviewed by the patient verbally.   ? ? ?History of Present Illness:   ? ?Angela Mullen is a 66 y.o. female past medical history of ?essential hypertension,  ?hyperlipidemia ?type 2 diabetes.  ?Cardiac catheterization 07/15/2014 revealed normal coronary anatomy and normal left ventricular function ?Fatty liver ?Type 2 diabetes ?COPD, remote smoking ?Morbid obesity ?Chronic shortness of breath ?Chronic lower extremity edema ?Gastric bypass surgery ?Chronic kidney disease followed by nephrology ?Who presents for follow-up of her hypertension and hyperlipidemia ? ?Last seen by myself on telemetry visit September 2020 ?In follow-up today reports feeling relatively well ?No new health issues ?No regular exercise program ?Some SOB at rest,"maybe allergies" ? ?Nonsmoker very remote smoking history ?Reports that she has cut out soda, drinks mainly water ? ?Lab work reviewed ?CR 1.57 ?Cholesterol 145 ?A1c 5.4 ? ?Previous gastric bypass surgery with over 100 pound weight loss ?Has a therapist/psychiatry ? ?Echocardiogram July 20 50,018 showing normal LV function mild LVH normal diastolic function mild aortic valve stenosis peak gradient 28 mmHg mean gradient 13 mmHg ? ?EKG personally reviewed by myself on todays visit ?Sinus bradycardia rate 58 bpm no significant ST-T wave changes ? ? ?Past Medical History:  ?Diagnosis Date  ? Abnormal liver enzymes 09/03/2014  ? Acid reflux   ? Allergy   ? Anemia   ? H/O  ? Anxiety   ? Arthritis   ? Cataract   ? COPD (chronic obstructive pulmonary disease) ([place])   ? Depression   ? Diabetes  mellitus without complication ([place])   ? Dyspnea   ? WITH EXERTION DUE TO COPD  ? Elevation of level of transaminase or lactic acid dehydrogenase (LDH) 08/27/2012  ? Fatty liver   ? Headache   ? H/O MIGRAINES  ? High blood pressure   ? High cholesterol   ? Sleep apnea   ? BIPAP  ? ?Past Surgical History:  ?Procedure Laterality Date  ? BARIATRIC SURGERY  06/15/2017  ? CARDIAC CATHETERIZATION    ? COLONOSCOPY WITH PROPOFOL N/A 10/10/2018  ? Procedure: COLONOSCOPY WITH PROPOFOL;  Surgeon: Jonathon Bellows, MD;  Location: [facility] ENDOSCOPY;  Service: Gastroenterology;  Laterality: N/A;  ? FRACTURE SURGERY  1990  ? RIGHT LEG   ? KNEE SURGERY    ? REPLACEMENT TOTAL KNEE Left   ? TONSILLECTOMY    ? TOTAL ABDOMINAL HYSTERECTOMY W/ BILATERAL SALPINGOOPHORECTOMY    ? TOTAL KNEE ARTHROPLASTY Left 04/28/2020  ? Procedure: TOTAL KNEE ARTHROPLASTY;  Surgeon: Lovell Sheehan, MD;  Location: [facility] ORS;  Service: Orthopedics;  Laterality: Left;  ? TUBAL LIGATION    ?  ? ? ?Allergies:   Ketorolac, Prednisone, and Nsaids  ? ?Social History  ? ?Tobacco Use  ? Smoking status: Never  ? Smokeless tobacco: Never  ?Vaping Use  ? Vaping Use: Never used  ?Substance Use Topics  ? Alcohol use: Yes  ?  Comment: occassional  ? Drug use: No  ?  ? ?Current Outpatient Medications on File Prior to Visit  ?Medication Sig Dispense Refill  ? acetaminophen (  TYLENOL) 500 MG tablet Take 1,000 mg by mouth every 6 (six) hours as needed for moderate pain.     ? ADVAIR HFA 115-21 MCG/ACT inhaler INHALE 2 PUFFS BY MOUTH EVERY 12 HOURS 3 Inhaler 3  ? albuterol (PROVENTIL) (2.5 MG/3ML) 0.083% nebulizer solution Take 3 mLs (2.5 mg total) by nebulization every 6 (six) hours as needed for wheezing or shortness of breath. 270 mL 3  ? albuterol (VENTOLIN HFA) 108 (90 Base) MCG/ACT inhaler Inhale 1-2 puffs into the lungs every 6 (six) hours as needed for wheezing or shortness of breath. 54 g 3  ? amLODipine (NORVASC) 5 MG tablet Take 1 tablet by mouth once daily 90 tablet 3  ?  atenolol (TENORMIN) 50 MG tablet Take 1 tablet (4m) in the morning and 2 tablets (1090m in the evening. 90 tablet 1  ? Brexpiprazole (REXULTI) 0.5 MG TABS Take 1 tablet (0.5 mg total) by mouth daily. 90 tablet 1  ? buPROPion (WELLBUTRIN SR) 150 MG 12 hr tablet Take 1 tablet (150 mg total) by mouth 2 (two) times daily. 180 tablet 1  ? clotrimazole-betamethasone (LOTRISONE) cream APPLY  CREAM TOPICALLY TWICE DAILY AS NEEDED 30 g 0  ? Dulaglutide (TRULICITY) 0.3.29GJJ/8.8CZOPN Inject 0.75 mg into the skin once a week. 3 mL 0  ? DULoxetine (CYMBALTA) 30 MG capsule Take 1 capsule by mouth at bedtime 90 capsule 0  ? DULoxetine (CYMBALTA) 60 MG capsule Take 1 capsule by mouth at bedtime 90 capsule 0  ? fexofenadine (ALLEGRA ALLERGY) 180 MG tablet Take 1 tablet (180 mg total) by mouth daily. 10 tablet 1  ? lisinopril (ZESTRIL) 20 MG tablet Take 1 tablet (20 mg total) by mouth daily. NO FURTHER REFILLS UNTIL SEEN IN CLINIC PLEASE CALL OFFICE TO SCHEDULE AN APPOINTMENT [phone] tablet 1  ? lovastatin (MEVACOR) 40 MG tablet Take 1 tablet (40 mg total) by mouth at bedtime. 90 tablet 1  ? methocarbamol (ROBAXIN) 750 MG tablet TAKE 1 TABLET (750 MG TOTAL) BY MOUTH EVERY 8 (EIGHT) HOURS AS NEEDED. 90 tablet 0  ? omeprazole (PRILOSEC) 40 MG capsule Take 1 capsule (40 mg total) by mouth daily. 90 capsule 1  ? pregabalin (LYRICA) 150 MG capsule Take 2 capsules (300 mg total) by mouth daily. 60 capsule 2  ? traZODone (DESYREL) 100 MG tablet Take 2 tablets (200 mg total) by mouth at bedtime as needed for sleep. 180 tablet 1  ? fluticasone-salmeterol (ADVAIR HFA) 115-21 MCG/ACT inhaler Inhale 2 puffs into the lungs 2 (two) times daily as needed. (Patient not taking: Reported on 01/18/2022)    ? tiotropium (SPIRIVA) 18 MCG inhalation capsule Place 18 mcg into inhaler and inhale daily as needed. (Patient not taking: Reported on 01/18/2022)    ? ?No current facility-administered medications on file prior to visit.  ?  ? ?Family  Hx: ?The patient's family history includes Cerebral aneurysm in her mother; Depression in her sister; Diabetes in her brother, father, and paternal grandfather; Heart attack in her maternal grandfather and sister; Heart disease in her paternal grandfather; High blood pressure in her brother; Hyperlipidemia in her paternal grandfather and son; Hypertension in her sister; Kidney disease in her sister; Obesity in her sister; Stroke in her father, maternal grandmother, and sister. ? ?ROS:   ?Please see the history of present illness.    ?Review of Systems  ?Constitutional: Negative.   ?     Weight increase  ?HENT: Negative.    ?Respiratory: Negative.    ?Cardiovascular:  Negative.   ?Gastrointestinal: Negative.   ?Musculoskeletal: Negative.   ?Neurological: Negative.   ?Psychiatric/Behavioral: Negative.    ?All other systems reviewed and are negative.  ? ? ?Labs/Other Tests and Data Reviewed:   ? ?Recent Labs: ?11/15/2021: ALT 21; BUN 13; Creatinine, Ser 1.57; Potassium 4.2; Sodium 139  ? ?Recent Lipid Panel ?Lab Results  ?Component Value Date/Time  ? CHOL 145 11/15/2021 09:55 AM  ? TRIG 96 11/15/2021 09:55 AM  ? HDL 62 11/15/2021 09:55 AM  ? CHOLHDL 2.3 11/15/2021 09:55 AM  ? LDLCALC 65 11/15/2021 09:55 AM  ? ? ?Wt Readings from Last 3 Encounters:  ?01/24/22 225 lb 8 oz (102.3 kg)  ?12/20/21 227 lb 3.2 oz (103.1 kg)  ?11/15/21 222 lb 9.6 oz (101 kg)  ?  ? ?Exam:   ? ?Vital Signs: Vital signs may also be detailed in the HPI ?BP 104/70 (BP Location: Left Arm, Patient Position: Sitting, Cuff Size: Large)   Pulse (!) 58   Ht 5' 5.5" (1.664 m)   Wt 225 lb 8 oz (102.3 kg)   SpO2 98%   BMI 36.95 kg/m?   ?Constitutional:  oriented to person, place, and time. No distress.  ?HENT:  ?Head: Grossly normal ?Eyes:  no discharge. No scleral icterus.  ?Neck: No JVD, no carotid bruits  ?Cardiovascular: Regular rate and rhythm, no murmurs appreciated ?Pulmonary/Chest: Clear to auscultation bilaterally, no wheezes or rails ?Abdominal:  Soft.  no distension.  no tenderness.  ?Musculoskeletal: Normal range of motion ?Neurological:  normal muscle tone. Coordination normal. No atrophy ?Skin: Skin warm and dry ?Psychiatric: normal affect, pleasa

## 2022-01-24 ENCOUNTER — Telehealth: Payer: Self-pay | Admitting: Nurse Practitioner

## 2022-01-24 ENCOUNTER — Ambulatory Visit: Payer: Medicare Other | Admitting: Cardiovascular Disease

## 2022-01-24 ENCOUNTER — Encounter: Payer: Self-pay | Admitting: Cardiovascular Disease

## 2022-01-24 ENCOUNTER — Encounter: Payer: Self-pay | Admitting: Nurse Practitioner

## 2022-01-24 VITALS — BP 104/70 | HR 58 | Ht 65.5 in | Wt 225.5 lb

## 2022-01-24 DIAGNOSIS — I10 Essential (primary) hypertension: Secondary | ICD-10-CM | POA: Diagnosis not present

## 2022-01-24 DIAGNOSIS — J449 Chronic obstructive pulmonary disease, unspecified: Secondary | ICD-10-CM

## 2022-01-24 DIAGNOSIS — R8290 Unspecified abnormal findings in urine: Secondary | ICD-10-CM | POA: Diagnosis not present

## 2022-01-24 DIAGNOSIS — N1832 Chronic kidney disease, stage 3b: Secondary | ICD-10-CM | POA: Diagnosis not present

## 2022-01-24 DIAGNOSIS — E785 Hyperlipidemia, unspecified: Secondary | ICD-10-CM | POA: Diagnosis not present

## 2022-01-24 DIAGNOSIS — E782 Mixed hyperlipidemia: Secondary | ICD-10-CM | POA: Diagnosis not present

## 2022-01-24 DIAGNOSIS — R809 Proteinuria, unspecified: Secondary | ICD-10-CM | POA: Diagnosis not present

## 2022-01-24 MED ORDER — LISINOPRIL 20 MG PO TABS: 20.0000 mg | ORAL_TABLET | Freq: Every day | ORAL | 3 refills | Status: DC

## 2022-01-24 MED ORDER — ATENOLOL 50 MG PO TABS: ORAL_TABLET | ORAL | 3 refills | Status: DC

## 2022-01-24 MED ORDER — LOVASTATIN 40 MG PO TABS: 40.0000 mg | ORAL_TABLET | Freq: Every day | ORAL | 3 refills | Status: DC

## 2022-01-24 NOTE — Patient Instructions (Signed)
Medication Instructions:  No changes  If you need a refill on your cardiac medications before your next appointment, please call your pharmacy.   Lab work: No new labs needed  Testing/Procedures: No new testing needed  Follow-Up: At [facility], you and your health needs are our priority.  As part of our continuing mission to provide you with exceptional heart care, we have created designated Provider Care Teams.  These Care Teams include your primary Cardiologist (physician) and Advanced Practice Providers (APPs -  Physician Assistants and Nurse Practitioners) who all work together to provide you with the care you need, when you need it.  You will need a follow up appointment in 12 months  Providers on your designated Care Team:   Christopher Berge, NP Ryan Dunn, PA-C Cadence Furth, PA-C  COVID-19 Vaccine Information can be found at: [email or web address] For questions related to vaccine distribution or appointments, please email [email or web address] or call [phone].   

## 2022-01-24 NOTE — Telephone Encounter (Signed)
Copied from [place] [id number]. Topic: General - Other ?>> Jan 24, 2022 12:10 PM Leward Quan A wrote: ?Reason for CRM: Patient called in to inform Jon Billings that her Brexpiprazole (REXULTI) 0.5 MG TABS is now $560 after all her insurances have been applied and she can not afford this so asking for a change like she stated was discussed at last visit. Patient can be reached for further questions at   Ph# [phone] ?

## 2022-01-25 MED ORDER — LURASIDONE HCL 20 MG PO TABS: 20.0000 mg | ORAL_TABLET | Freq: Every day | ORAL | 1 refills | Status: DC

## 2022-01-25 NOTE — Telephone Encounter (Signed)
Medication changed to [facility] as discussed during visit. ?

## 2022-03-17 ENCOUNTER — Telehealth: Payer: Self-pay

## 2022-03-17 NOTE — Telephone Encounter (Signed)
Information updated

## 2022-03-17 NOTE — Telephone Encounter (Signed)
Routing to admin

## 2022-03-21 NOTE — Progress Notes (Signed)
BP 131/76   Pulse 73   Temp 97.8 F (36.6 C) (Oral)   Wt 225 lb 9.6 oz (102.3 kg)   SpO2 96%   BMI 36.97 kg/m    Subjective:    Patient ID: Angela Mullen, female    DOB: [birth year], 66 y.o.   MRN: [id number]  HPI: Angela Mullen is a 66 y.o. female  Chief Complaint  Patient presents with   Hypertension    3 month follow up    Hyperlipidemia   Diabetes   Anxiety   Depression   Medication Refill    Patient reports needing refills on multiple medications     Foot Pain    Patient would like to discuss gout flare up on R foot    ANXIETY/DEPRESSION Patient states she feels like the Latuda didn't help her symptoms.  Patient states it caused her to gain weight, giving her crazy dreams.  She stopped the medication on on June 4. She still has a decreased interest in doing any activities.  She is nervous and depressed.  Denies SI.   [place] Office Visit from 03/22/2022 in [facility]  PHQ-9 Total Score 17         03/22/2022    9:12 AM 01/18/2022    2:41 PM 12/20/2021    8:27 AM 11/15/2021    9:29 AM  GAD 7 : Generalized Anxiety Score  Nervous, Anxious, on Edge 1 0 1 1  Control/stop worrying 1 0 0 1  Worry too much - different things 1 0 1 1  Trouble relaxing 1 0 0 1  Restless 1 0 1 1  Easily annoyed or irritable 2 0 0 1  Afraid - awful might happen 0 0 0 0  Total GAD 7 Score 7 0 3 6  Anxiety Difficulty Somewhat difficult Not difficult at all Somewhat difficult Somewhat difficult    GOUT Patient states she is having a GOUT flare.  Symptoms have been on and off since April.  She is drinking vinegar and using cold compress.   CHRONIC KIDNEY DISEASE Patient has been seeing the nephrologist for CKD. They discussed weight loss options to help control diabetes and CKD. Patient has changed her diet and lost some weight. She has a follow up coming up with Dr. Calvert Cantor.   HYPERTENSION Hypertension status: controlled  Satisfied with current treatment?  no Duration of hypertension: years BP monitoring frequency:  not checking BP range:  BP medication side effects:  no Medication compliance: excellent compliance Previous BP meds: atenolol, amlodipine, and lisinopril Aspirin: no Recurrent headaches: no Visual changes: no Palpitations: no Dyspnea: no Chest pain: no Lower extremity edema: no Dizzy/lightheaded: no  DIABETES Hypoglycemic episodes:no Polydipsia/polyuria: no Visual disturbance: no Chest pain: no Paresthesias: no Glucose Monitoring: no  Accucheck frequency: Not Checking  Fasting glucose:  Post prandial:  Evening:  Before meals: Taking Insulin?: no  Long acting insulin:  Short acting insulin: Blood Pressure Monitoring: not checking Retinal Examination: Not up to Date Foot Exam: Up to Date Diabetic Education: Not Completed Pneumovax: Up to Date Influenza: Up to Date Aspirin: no   Denies HA, CP, SOB, dizziness, palpitations, visual changes, and lower extremity swelling. Denies polyuria and polydipsia.  Relevant past medical, surgical, family and social history reviewed and updated as indicated. Interim medical history since our last visit reviewed. Allergies and medications reviewed and updated.  Review of Systems  Eyes:  Negative for visual disturbance.  Respiratory:  Negative for cough, chest tightness  and shortness of breath.   Cardiovascular:  Negative for chest pain, palpitations and leg swelling.  Endocrine: Negative for polydipsia and polyuria.  Musculoskeletal:        Right toe pain  Neurological:  Negative for dizziness and headaches.  Psychiatric/Behavioral:  Positive for dysphoric mood. Negative for suicidal ideas. The patient is nervous/anxious.     Per HPI unless specifically indicated above     Objective:    BP 131/76   Pulse 73   Temp 97.8 F (36.6 C) (Oral)   Wt 225 lb 9.6 oz (102.3 kg)   SpO2 96%   BMI 36.97 kg/m   Wt Readings from Last 3 Encounters:  03/22/22 225 lb 9.6 oz  (102.3 kg)  01/24/22 225 lb 8 oz (102.3 kg)  12/20/21 227 lb 3.2 oz (103.1 kg)    Physical Exam Vitals and nursing note reviewed.  Constitutional:      General: She is not in acute distress.    Appearance: Normal appearance. She is obese. She is not ill-appearing, toxic-appearing or diaphoretic.  HENT:     Head: Normocephalic.     Right Ear: External ear normal.     Left Ear: External ear normal.     Nose: Nose normal.     Mouth/Throat:     Mouth: Mucous membranes are moist.     Pharynx: Oropharynx is clear.  Eyes:     General:        Right eye: No discharge.        Left eye: No discharge.     Extraocular Movements: Extraocular movements intact.     Conjunctiva/sclera: Conjunctivae normal.     Pupils: Pupils are equal, round, and reactive to light.  Cardiovascular:     Rate and Rhythm: Normal rate and regular rhythm.     Heart sounds: No murmur heard. Pulmonary:     Effort: Pulmonary effort is normal. No respiratory distress.     Breath sounds: Normal breath sounds. No wheezing or rales.  Musculoskeletal:     Cervical back: Normal range of motion and neck supple.       Feet:  Skin:    General: Skin is warm and dry.     Capillary Refill: Capillary refill takes less than 2 seconds.  Neurological:     General: No focal deficit present.     Mental Status: She is alert and oriented to person, place, and time. Mental status is at baseline.  Psychiatric:        Mood and Affect: Mood normal.        Behavior: Behavior normal.        Thought Content: Thought content normal.        Judgment: Judgment normal.     Results for orders placed or performed in visit on 11/15/21  Comp Met (CMET)  Result Value Ref Range   Glucose 95 70 - 99 mg/dL   BUN 13 8 - 27 mg/dL   Creatinine, Ser 1.57 (H) 0.57 - 1.00 mg/dL   eGFR 37 (L) >59 mL/min/1.73   BUN/Creatinine Ratio 8 (L) 12 - 28   Sodium 139 134 - 144 mmol/L   Potassium 4.2 3.5 - 5.2 mmol/L   Chloride 109 (H) 96 - 106 mmol/L    CO2 20 20 - 29 mmol/L   Calcium 8.9 8.7 - 10.3 mg/dL   Total Protein 6.1 6.0 - 8.5 g/dL   Albumin 4.3 3.8 - 4.8 g/dL   Globulin, Total 1.8 1.5 - 4.5 g/dL  Albumin/Globulin Ratio 2.4 (H) 1.2 - 2.2   Bilirubin Total 0.3 0.0 - 1.2 mg/dL   Alkaline Phosphatase 102 44 - 121 IU/L   AST 21 0 - 40 IU/L   ALT 21 0 - 32 IU/L  Lipid Profile  Result Value Ref Range   Cholesterol, Total 145 100 - 199 mg/dL   Triglycerides 96 0 - 149 mg/dL   HDL 62 >39 mg/dL   VLDL Cholesterol Cal 18 5 - 40 mg/dL   LDL Chol Calc (NIH) 65 0 - 99 mg/dL   Chol/HDL Ratio 2.3 0.0 - 4.4 ratio  HgB A1c  Result Value Ref Range   Hgb A1c MFr Bld 5.4 4.8 - 5.6 %   Est. average glucose Bld gHb Est-mCnc 108 mg/dL      Assessment & Plan:   Problem List Items Addressed This Visit       Cardiovascular and Mediastinum   Essential (primary) hypertension - Primary    Chronic.  Controlled.  Continue with current medication regimen of Atenolol 50m, Amlodipine 5666m and Lisinopril 2010mRefilled Medications today. Labs ordered today.  Return to clinic in 3 months for reevaluation.  Call sooner if concerns arise.        Relevant Orders   Comp Met (CMET)     Respiratory   Chronic obstructive pulmonary disease (HCC)    Chronic.  Controlled.  Continue with current medication regimen.  Labs ordered today.  Return to clinic in 3 months for reevaluation.  Call sooner if concerns arise.          Endocrine   Hyperlipidemia associated with type 2 diabetes mellitus (HCC)    Chronic.  Controlled.  Continue with current medication regimen on Lovastatin 74m74mLabs ordered today.  Return to clinic in 3 months for reevaluation.  Call sooner if concerns arise.        Relevant Medications   Dulaglutide (TRULICITY) 0.751.610WR/6.0AVN   Other Relevant Orders   Lipid Profile   DM (diabetes mellitus), type 2 with complications (HCC)    Chronic.  Controlled.  Will increase Trulicity 1.66m4.0JWweekly.   Labs ordered today. Will make  recommendations based on lab results.  Return to clinic in 3 months for reevaluation.  Call sooner if concerns arise.        Relevant Medications   Dulaglutide (TRULICITY) 0.751.190JY/7.8GNN   Other Relevant Orders   HgB A1c     Genitourinary   Chronic kidney disease (CKD), stage III (moderate) (HCC)    Chronic.  Labs ordered today.  Following up with Nephrology for ongoing management in kidney disease. Continue with weight loss and continue with recommendations per nephrology.         Other   Depression, recurrent (HCC)[place] Chronic. Not well controlled. Was doing well with Rexulti but insurance no longer covers it.  Did not like the [place] change to Vraylar.  Side effects and benefits of medication discussed.  Follow up in 1 month for reevaluation.      Relevant Medications   buPROPion (WELLBUTRIN SR) 150 MG 12 hr tablet   DULoxetine (CYMBALTA) 30 MG capsule   DULoxetine (CYMBALTA) 60 MG capsule   traZODone (DESYREL) 100 MG tablet   Anxiety    Chronic. Not well controlled. Was doing well with Rexulti but insurance no longer covers it.  Did not like the [place] change to Vraylar.  Side effects and benefits of medication discussed.  Follow up  in 1 month for reevaluation.      Relevant Medications   buPROPion (WELLBUTRIN SR) 150 MG 12 hr tablet   DULoxetine (CYMBALTA) 30 MG capsule   DULoxetine (CYMBALTA) 60 MG capsule   traZODone (DESYREL) 100 MG tablet   Other Visit Diagnoses     Chronic gout involving toe of right foot without tophus, unspecified cause       Will treat with Cholchicine. Discussed how to properly use.  Discussed side effects and benefits of medication. Follow up if symptoms do not improve.   Relevant Medications   colchicine 0.6 MG tablet        Follow up plan: Return in about 1 month (around 04/21/2022) for Depression/Anxiety FU.

## 2022-03-22 ENCOUNTER — Ambulatory Visit (INDEPENDENT_AMBULATORY_CARE_PROVIDER_SITE_OTHER): Payer: Medicare Other | Admitting: Nurse Practitioner

## 2022-03-22 ENCOUNTER — Encounter: Payer: Self-pay | Admitting: Nurse Practitioner

## 2022-03-22 VITALS — BP 131/76 | HR 73 | Temp 97.8°F | Wt 225.6 lb

## 2022-03-22 DIAGNOSIS — E1169 Type 2 diabetes mellitus with other specified complication: Secondary | ICD-10-CM

## 2022-03-22 DIAGNOSIS — F419 Anxiety disorder, unspecified: Secondary | ICD-10-CM

## 2022-03-22 DIAGNOSIS — J449 Chronic obstructive pulmonary disease, unspecified: Secondary | ICD-10-CM

## 2022-03-22 DIAGNOSIS — M1A9XX0 Chronic gout, unspecified, without tophus (tophi): Secondary | ICD-10-CM

## 2022-03-22 DIAGNOSIS — I10 Essential (primary) hypertension: Secondary | ICD-10-CM

## 2022-03-22 DIAGNOSIS — E118 Type 2 diabetes mellitus with unspecified complications: Secondary | ICD-10-CM

## 2022-03-22 DIAGNOSIS — F339 Major depressive disorder, recurrent, unspecified: Secondary | ICD-10-CM

## 2022-03-22 DIAGNOSIS — G4733 Obstructive sleep apnea (adult) (pediatric): Secondary | ICD-10-CM

## 2022-03-22 DIAGNOSIS — E785 Hyperlipidemia, unspecified: Secondary | ICD-10-CM

## 2022-03-22 DIAGNOSIS — N1832 Chronic kidney disease, stage 3b: Secondary | ICD-10-CM

## 2022-03-22 MED ORDER — CARIPRAZINE HCL 1.5 MG PO CAPS: 1.5000 mg | ORAL_CAPSULE | Freq: Every day | ORAL | 0 refills | Status: DC

## 2022-03-22 MED ORDER — TRULICITY 0.75 MG/0.5ML ~~LOC~~ SOAJ: 1.5000 mg | SUBCUTANEOUS | 1 refills | Status: DC

## 2022-03-22 MED ORDER — PREGABALIN 150 MG PO CAPS: 300.0000 mg | ORAL_CAPSULE | Freq: Every day | ORAL | 2 refills | Status: DC

## 2022-03-22 MED ORDER — BUPROPION HCL ER (SR) 150 MG PO TB12: 150.0000 mg | ORAL_TABLET | Freq: Two times a day (BID) | ORAL | 1 refills | Status: DC

## 2022-03-22 MED ORDER — TRAZODONE HCL 100 MG PO TABS: 200.0000 mg | ORAL_TABLET | Freq: Every evening | ORAL | 1 refills | Status: DC | PRN

## 2022-03-22 MED ORDER — DULOXETINE HCL 30 MG PO CPEP: 30.0000 mg | ORAL_CAPSULE | Freq: Every day | ORAL | 1 refills | Status: DC

## 2022-03-22 MED ORDER — DULOXETINE HCL 60 MG PO CPEP: 60.0000 mg | ORAL_CAPSULE | Freq: Every day | ORAL | 1 refills | Status: DC

## 2022-03-22 MED ORDER — COLCHICINE 0.6 MG PO TABS: 0.6000 mg | ORAL_TABLET | Freq: Every day | ORAL | 1 refills | Status: AC

## 2022-03-22 NOTE — Assessment & Plan Note (Signed)
Chronic.  Controlled.  Continue with current medication regimen on Lovastatin 20m.  Labs ordered today.  Return to clinic in 3 months for reevaluation.  Call sooner if concerns arise.

## 2022-03-22 NOTE — Assessment & Plan Note (Addendum)
Chronic.  Controlled.  Will increase Trulicity 6.1IL to weekly.   Labs ordered today. Will make recommendations based on lab results.  Return to clinic in 3 months for reevaluation.  Call sooner if concerns arise.

## 2022-03-22 NOTE — Assessment & Plan Note (Signed)
Chronic. Not well controlled. Was doing well with Rexulti but insurance no longer covers it.  Did not like the [place].  Will change to Vraylar.  Side effects and benefits of medication discussed.  Follow up in 1 month for reevaluation.

## 2022-03-22 NOTE — Assessment & Plan Note (Signed)
Chronic.  Controlled.  Continue with current medication regimen.  Labs ordered today.  Return to clinic in 3 months for reevaluation.  Call sooner if concerns arise.

## 2022-03-22 NOTE — Assessment & Plan Note (Signed)
Chronic.  Controlled.  Continue with current medication regimen of Atenolol 67m, Amlodipine 527m and Lisinopril 2043mRefilled Medications today. Labs ordered today.  Return to clinic in 3 months for reevaluation.  Call sooner if concerns arise.

## 2022-03-22 NOTE — Assessment & Plan Note (Signed)
Chronic.  Labs ordered today.  Following up with Nephrology for ongoing management in kidney disease. Continue with weight loss and continue with recommendations per nephrology.

## 2022-03-23 LAB — COMPREHENSIVE METABOLIC PANEL
ALT: 25 IU/L (ref 0–32)
AST: 20 IU/L (ref 0–40)
Albumin/Globulin Ratio: 2.3 — ABNORMAL HIGH (ref 1.2–2.2)
Albumin: 4.1 g/dL (ref 3.8–4.8)
Alkaline Phosphatase: 119 IU/L (ref 44–121)
BUN/Creatinine Ratio: 8 — ABNORMAL LOW (ref 12–28)
BUN: 13 mg/dL (ref 8–27)
Bilirubin Total: 0.2 mg/dL (ref 0.0–1.2)
CO2: 20 mmol/L (ref 20–29)
Calcium: 8.9 mg/dL (ref 8.7–10.3)
Chloride: 107 mmol/L — ABNORMAL HIGH (ref 96–106)
Creatinine, Ser: 1.66 mg/dL — ABNORMAL HIGH (ref 0.57–1.00)
Globulin, Total: 1.8 g/dL (ref 1.5–4.5)
Glucose: 107 mg/dL — ABNORMAL HIGH (ref 70–99)
Potassium: 3.8 mmol/L (ref 3.5–5.2)
Sodium: 141 mmol/L (ref 134–144)
Total Protein: 5.9 g/dL — ABNORMAL LOW (ref 6.0–8.5)
eGFR: 34 mL/min/{1.73_m2} — ABNORMAL LOW (ref >59)

## 2022-03-23 LAB — LIPID PANEL
Chol/HDL Ratio: 2.7 ratio (ref 0.0–4.4)
Cholesterol, Total: 160 mg/dL (ref 100–199)
HDL: 59 mg/dL (ref >39)
LDL Chol Calc (NIH): 87 mg/dL (ref 0–99)
Triglycerides: 74 mg/dL (ref 0–149)
VLDL Cholesterol Cal: 14 mg/dL (ref 5–40)

## 2022-03-23 LAB — HEMOGLOBIN A1C
Est. average glucose Bld gHb Est-mCnc: 114 mg/dL
Hgb A1c MFr Bld: 5.6 % (ref 4.8–5.6)

## 2022-03-23 NOTE — Progress Notes (Signed)
Hi Angela Mullen. It was nice to see you yesterday.  Your lab work looks good.  Kidney function remains stable.  Continue to follow up with Nephrology.  Your A1c and cholesterol look great.  Continue with your current medication regimen.  Follow up as discussed.  Please let me know if you have any questions.

## 2022-03-24 ENCOUNTER — Telehealth: Payer: Self-pay | Admitting: Nurse Practitioner

## 2022-03-24 NOTE — Telephone Encounter (Signed)
Mickel Baas from,, [place], [place] pharmacy called in states, the 1.5 Trulicity injection should have been sent in, but they receive 2.5, also the Vraylar it not covered under insurance and too expensive at 497.00. Please call back for further assistance.

## 2022-03-24 NOTE — Telephone Encounter (Signed)
Routing to provider to clarify prescription.

## 2022-03-24 NOTE — Telephone Encounter (Signed)
Pts RX for Dulaglutide (TRULICITY) 1.15 BW/6.2MB SOPN / is written to take 1.5MG a day / so it is written wrong as if pt needs 2 .75 shots/ and RX needs to be corrected asap so pt can get before the weekend.     cariprazine (VRAYLAR) 1.5 MG capsule was not received by the pharmacy / didn't go through / please advise   [facility] [address] [place]  [place], [place] [address]  Phone:  [phone]  Fax:  [phone]  DEA #:  --

## 2022-03-27 MED ORDER — TRULICITY 0.75 MG/0.5ML ~~LOC~~ SOAJ: 1.5000 mg | SUBCUTANEOUS | 1 refills | Status: DC

## 2022-03-27 MED ORDER — TRULICITY 1.5 MG/0.5ML ~~LOC~~ SOAJ: 1.5000 mg | SUBCUTANEOUS | 1 refills | Status: DC

## 2022-03-27 NOTE — Telephone Encounter (Signed)
Please let patient know that her medication was sent to the pharmacy.

## 2022-03-27 NOTE — Telephone Encounter (Signed)
[facility] called and stated that the RX for Dulaglutide (TRULICITY) 1.84 QT/9.2NG SOPN  needs to be sent as 1.5MG / the directions say to inject 1.28m into skin once a week / so they needs a new RX with the correct dose of 1.572minstead of the 0.7518m please advise asap

## 2022-03-27 NOTE — Telephone Encounter (Signed)
Medication sent to the pharmacy.

## 2022-03-27 NOTE — Telephone Encounter (Signed)
Attemped to call patient regarding medications being sent to pharmacy. Patient did not answer. [place].   OK for PEC/Nurse Triage to give results for patient if he calls back.

## 2022-03-30 ENCOUNTER — Encounter: Payer: Self-pay | Admitting: Nurse Practitioner

## 2022-03-30 MED ORDER — LURASIDONE HCL 20 MG PO TABS: 20.0000 mg | ORAL_TABLET | Freq: Every day | ORAL | 0 refills | Status: DC

## 2022-04-05 DIAGNOSIS — R809 Proteinuria, unspecified: Secondary | ICD-10-CM | POA: Diagnosis not present

## 2022-04-05 DIAGNOSIS — E118 Type 2 diabetes mellitus with unspecified complications: Secondary | ICD-10-CM | POA: Diagnosis not present

## 2022-04-05 DIAGNOSIS — Z9884 Bariatric surgery status: Secondary | ICD-10-CM | POA: Diagnosis not present

## 2022-04-05 DIAGNOSIS — R8290 Unspecified abnormal findings in urine: Secondary | ICD-10-CM | POA: Diagnosis not present

## 2022-04-06 MED ORDER — BREXPIPRAZOLE 0.25 MG PO TABS: 0.2500 mg | ORAL_TABLET | Freq: Every day | ORAL | 0 refills | Status: DC

## 2022-04-06 MED ORDER — OZEMPIC (0.25 OR 0.5 MG/DOSE) 2 MG/1.5ML ~~LOC~~ SOPN: 0.2500 mg | PEN_INJECTOR | SUBCUTANEOUS | 1 refills | Status: DC

## 2022-04-20 NOTE — Progress Notes (Deleted)
There were no vitals taken for this visit.   Subjective:    Patient ID: Angela Mullen, female    DOB: 04-19-1956, 66 y.o.   MRN: [id number]  HPI: Angela Mullen is a 66 y.o. female  No chief complaint on file.  ANXIETY/DEPRESSION Patient states she feels like the Latuda didn't help her symptoms.  Patient states it caused her to gain weight, giving her crazy dreams.  She stopped the medication on on June 4. She still has a decreased interest in doing any activities.  She is nervous and depressed.  Denies SI.   [place] Office Visit from 03/22/2022 in [place]  PHQ-9 Total Score 17         03/22/2022    9:12 AM 01/18/2022    2:41 PM 12/20/2021    8:27 AM 11/15/2021    9:29 AM  GAD 7 : Generalized Anxiety Score  Nervous, Anxious, on Edge 1 0 1 1  Control/stop worrying 1 0 0 1  Worry too much - different things 1 0 1 1  Trouble relaxing 1 0 0 1  Restless 1 0 1 1  Easily annoyed or irritable 2 0 0 1  Afraid - awful might happen 0 0 0 0  Total GAD 7 Score 7 0 3 6  Anxiety Difficulty Somewhat difficult Not difficult at all Somewhat difficult Somewhat difficult      Relevant past medical, surgical, family and social history reviewed and updated as indicated. Interim medical history since our last visit reviewed. Allergies and medications reviewed and updated.  Review of Systems  Eyes:  Negative for visual disturbance.  Respiratory:  Negative for cough, chest tightness and shortness of breath.   Cardiovascular:  Negative for chest pain, palpitations and leg swelling.  Endocrine: Negative for polydipsia and polyuria.  Musculoskeletal:        Right toe pain  Neurological:  Negative for dizziness and headaches.  Psychiatric/Behavioral:  Positive for dysphoric mood. Negative for suicidal ideas. The patient is nervous/anxious.     Per HPI unless specifically indicated above     Objective:    There were no vitals taken for this visit.  Wt Readings  from Last 3 Encounters:  03/22/22 225 lb 9.6 oz (102.3 kg)  01/24/22 225 lb 8 oz (102.3 kg)  12/20/21 227 lb 3.2 oz (103.1 kg)    Physical Exam Vitals and nursing note reviewed.  Constitutional:      General: She is not in acute distress.    Appearance: Normal appearance. She is obese. She is not ill-appearing, toxic-appearing or diaphoretic.  HENT:     Head: Normocephalic.     Right Ear: External ear normal.     Left Ear: External ear normal.     Nose: Nose normal.     Mouth/Throat:     Mouth: Mucous membranes are moist.     Pharynx: Oropharynx is clear.  Eyes:     General:        Right eye: No discharge.        Left eye: No discharge.     Extraocular Movements: Extraocular movements intact.     Conjunctiva/sclera: Conjunctivae normal.     Pupils: Pupils are equal, round, and reactive to light.  Cardiovascular:     Rate and Rhythm: Normal rate and regular rhythm.     Heart sounds: No murmur heard. Pulmonary:     Effort: Pulmonary effort is normal. No respiratory distress.     Breath  sounds: Normal breath sounds. No wheezing or rales.  Musculoskeletal:     Cervical back: Normal range of motion and neck supple.       Feet:  Skin:    General: Skin is warm and dry.     Capillary Refill: Capillary refill takes less than 2 seconds.  Neurological:     General: No focal deficit present.     Mental Status: She is alert and oriented to person, place, and time. Mental status is at baseline.  Psychiatric:        Mood and Affect: Mood normal.        Behavior: Behavior normal.        Thought Content: Thought content normal.        Judgment: Judgment normal.     Results for orders placed or performed in visit on 03/22/22  Comp Met (CMET)  Result Value Ref Range   Glucose 107 (H) 70 - 99 mg/dL   BUN 13 8 - 27 mg/dL   Creatinine, Ser 1.66 (H) 0.57 - 1.00 mg/dL   eGFR 34 (L) >59 mL/min/1.73   BUN/Creatinine Ratio 8 (L) 12 - 28   Sodium 141 134 - 144 mmol/L   Potassium 3.8 3.5  - 5.2 mmol/L   Chloride 107 (H) 96 - 106 mmol/L   CO2 20 20 - 29 mmol/L   Calcium 8.9 8.7 - 10.3 mg/dL   Total Protein 5.9 (L) 6.0 - 8.5 g/dL   Albumin 4.1 3.8 - 4.8 g/dL   Globulin, Total 1.8 1.5 - 4.5 g/dL   Albumin/Globulin Ratio 2.3 (H) 1.2 - 2.2   Bilirubin Total 0.2 0.0 - 1.2 mg/dL   Alkaline Phosphatase 119 44 - 121 IU/L   AST 20 0 - 40 IU/L   ALT 25 0 - 32 IU/L  Lipid Profile  Result Value Ref Range   Cholesterol, Total 160 100 - 199 mg/dL   Triglycerides 74 0 - 149 mg/dL   HDL 59 >39 mg/dL   VLDL Cholesterol Cal 14 5 - 40 mg/dL   LDL Chol Calc (NIH) 87 0 - 99 mg/dL   Chol/HDL Ratio 2.7 0.0 - 4.4 ratio  HgB A1c  Result Value Ref Range   Hgb A1c MFr Bld 5.6 4.8 - 5.6 %   Est. average glucose Bld gHb Est-mCnc 114 mg/dL      Assessment & Plan:   Problem List Items Addressed This Visit       Other   Depression, recurrent ([place]) - Primary   Anxiety     Follow up plan: No follow-ups on file.

## 2022-04-21 ENCOUNTER — Ambulatory Visit: Payer: Medicare Other | Admitting: Nurse Practitioner

## 2022-04-28 ENCOUNTER — Ambulatory Visit: Payer: Medicare Other | Admitting: Nurse Practitioner

## 2022-05-08 ENCOUNTER — Other Ambulatory Visit: Payer: Self-pay | Admitting: Nurse Practitioner

## 2022-05-08 NOTE — Telephone Encounter (Signed)
Requested medication (s) are due for refill today: yes  Requested medication (s) are on the active medication list: yes  Last refill:  01/13/22 #30g/0  Future visit scheduled: yes  Notes to clinic:  Unable to refill per protocol, medication not assigned to the refill protocol.      Requested Prescriptions  Pending Prescriptions Disp Refills   clotrimazole-betamethasone (LOTRISONE) cream [Pharmacy Med Name: Clotrimazole-Betamethasone 1-0.05 % External Cream] 30 g 0    Sig: APPLY  CREAM TOPICALLY TWICE DAILY AS NEEDED     Off-Protocol Failed - 05/08/2022  6:16 AM      Failed - Medication not assigned to a protocol, review manually.      Passed - Valid encounter within last 12 months    Recent Outpatient Visits           1 month ago Essential (primary) hypertension   [place], NP   3 months ago Cold sore   [place] Vigg, Avanti, MD   4 months ago Depression, recurrent ([place])   [place], Karen, NP   5 months ago Essential (primary) hypertension   [facility] Jon Billings, NP   9 months ago Essential (primary) hypertension   [facility] Jon Billings, NP       Future Appointments             In 2 weeks Jon Billings, NP [facility], [place]

## 2022-05-24 ENCOUNTER — Encounter: Payer: Self-pay | Admitting: Nurse Practitioner

## 2022-05-24 ENCOUNTER — Ambulatory Visit: Payer: Medicare Other | Admitting: Nurse Practitioner

## 2022-05-24 ENCOUNTER — Telehealth: Payer: Self-pay

## 2022-05-24 NOTE — Telephone Encounter (Signed)
Copied from [id number]. Topic: Medicare AWV >> May 24, 2022  9:36 AM Leilani Able wrote: Reason for CRM: Pt would like an appt to have her AWV. Please call pt [phone]

## 2022-06-09 NOTE — Progress Notes (Unsigned)
   There were no vitals taken for this visit.   Subjective:    Patient ID: Angela Mullen, female    DOB: Sep 12, 1956, 66 y.o.   MRN: [id number]  HPI: Angela Mullen is a 66 y.o. female  No chief complaint on file.  HEADACHES Duration: {Blank single:19197::"chronic","days","weeks","months","years"} Onset: {Blank single:19197::"sudden","gradual"} Severity: {Blank single:19197::"mild","moderate","severe","1/10","2/10","3/10","4/10","5/10","6/10","7/10","8/10","9/10","10/10"} Quality: {Blank multiple:19196::"sharp","dull","aching","burning","cramping","ill-defined","itchy","pressure-like","pulling","shooting","sore","stabbing","tender","tearing","throbbing"} Frequency: {Blank single:19197::"constant","intermittent","occasional","rare","every few minutes","a few times a hour","a few times a day","a few times a week","a few times a month","a few times a year"} Location:  Headache duration: Radiation: {Blank single:19197::"yes","no"} Time of day headache occurs:  Alleviating factors:  Aggravating factors:  Headache status at time of visit: {Blank single:19197::"current headache","asymptomatic"} Treatments attempted: Treatments attempted: {Blank multiple:19196::"none","rest","ice","heat","APAP","ibuprofen","aleve", excedrine","triptans","propranolol","topamax","amitriptyline"}   Aura: {Blank single:19197::"yes","no"} Nausea:  {Blank single:19197::"yes","no"} Vomiting: {Blank single:19197::"yes","no"} Photophobia:  {Blank single:19197::"yes","no"} Phonophobia:  {Blank single:19197::"yes","no"} Effect on social functioning:  {Blank single:19197::"yes","no"} Numbers of missed days of school/work each month:  Confusion:  {Blank single:19197::"yes","no"} Gait disturbance/ataxia:  {Blank single:19197::"yes","no"} Behavioral changes:  {Blank single:19197::"yes","no"} Fevers:  {Blank single:19197::"yes","no"}  Relevant past medical, surgical, family and social history reviewed and updated  as indicated. Interim medical history since our last visit reviewed. Allergies and medications reviewed and updated.  Review of Systems  Per HPI unless specifically indicated above     Objective:    There were no vitals taken for this visit.  Wt Readings from Last 3 Encounters:  03/22/22 225 lb 9.6 oz (102.3 kg)  01/24/22 225 lb 8 oz (102.3 kg)  12/20/21 227 lb 3.2 oz (103.1 kg)    Physical Exam  Results for orders placed or performed in visit on 03/22/22  Comp Met (CMET)  Result Value Ref Range   Glucose 107 (H) 70 - 99 mg/dL   BUN 13 8 - 27 mg/dL   Creatinine, Ser 1.66 (H) 0.57 - 1.00 mg/dL   eGFR 34 (L) >59 mL/min/1.73   BUN/Creatinine Ratio 8 (L) 12 - 28   Sodium 141 134 - 144 mmol/L   Potassium 3.8 3.5 - 5.2 mmol/L   Chloride 107 (H) 96 - 106 mmol/L   CO2 20 20 - 29 mmol/L   Calcium 8.9 8.7 - 10.3 mg/dL   Total Protein 5.9 (L) 6.0 - 8.5 g/dL   Albumin 4.1 3.8 - 4.8 g/dL   Globulin, Total 1.8 1.5 - 4.5 g/dL   Albumin/Globulin Ratio 2.3 (H) 1.2 - 2.2   Bilirubin Total 0.2 0.0 - 1.2 mg/dL   Alkaline Phosphatase 119 44 - 121 IU/L   AST 20 0 - 40 IU/L   ALT 25 0 - 32 IU/L  Lipid Profile  Result Value Ref Range   Cholesterol, Total 160 100 - 199 mg/dL   Triglycerides 74 0 - 149 mg/dL   HDL 59 >39 mg/dL   VLDL Cholesterol Cal 14 5 - 40 mg/dL   LDL Chol Calc (NIH) 87 0 - 99 mg/dL   Chol/HDL Ratio 2.7 0.0 - 4.4 ratio  HgB A1c  Result Value Ref Range   Hgb A1c MFr Bld 5.6 4.8 - 5.6 %   Est. average glucose Bld gHb Est-mCnc 114 mg/dL      Assessment & Plan:   Problem List Items Addressed This Visit   None    Follow up plan: No follow-ups on file.

## 2022-06-12 ENCOUNTER — Ambulatory Visit (INDEPENDENT_AMBULATORY_CARE_PROVIDER_SITE_OTHER): Payer: Medicare Other | Admitting: Nurse Practitioner

## 2022-06-12 ENCOUNTER — Encounter: Payer: Self-pay | Admitting: Nurse Practitioner

## 2022-06-12 VITALS — BP 120/81 | HR 58 | Temp 97.8°F | Wt 224.3 lb

## 2022-06-12 DIAGNOSIS — E785 Hyperlipidemia, unspecified: Secondary | ICD-10-CM | POA: Diagnosis not present

## 2022-06-12 DIAGNOSIS — J449 Chronic obstructive pulmonary disease, unspecified: Secondary | ICD-10-CM

## 2022-06-12 DIAGNOSIS — N1832 Chronic kidney disease, stage 3b: Secondary | ICD-10-CM

## 2022-06-12 DIAGNOSIS — F419 Anxiety disorder, unspecified: Secondary | ICD-10-CM

## 2022-06-12 DIAGNOSIS — I10 Essential (primary) hypertension: Secondary | ICD-10-CM | POA: Diagnosis not present

## 2022-06-12 DIAGNOSIS — E1169 Type 2 diabetes mellitus with other specified complication: Secondary | ICD-10-CM | POA: Diagnosis not present

## 2022-06-12 DIAGNOSIS — E118 Type 2 diabetes mellitus with unspecified complications: Secondary | ICD-10-CM

## 2022-06-12 DIAGNOSIS — F339 Major depressive disorder, recurrent, unspecified: Secondary | ICD-10-CM

## 2022-06-12 MED ORDER — BREXPIPRAZOLE 0.25 MG PO TABS: 0.2500 mg | ORAL_TABLET | Freq: Every day | ORAL | 1 refills | Status: DC

## 2022-06-12 NOTE — Assessment & Plan Note (Signed)
Chronic.  Controlled.  Continue with current medication regimen on Lovastatin 5m.  Labs ordered today.  Return to clinic in 3 months for reevaluation.  Call sooner if concerns arise.

## 2022-06-12 NOTE — Assessment & Plan Note (Signed)
Chronic.  Controlled.  Continue with current medication regimen of Atenolol 56m, Amlodipine 571m and Lisinopril 2070mRefilled Medications today. Labs ordered today.  Return to clinic in 3 months for reevaluation.  Call sooner if concerns arise.

## 2022-06-12 NOTE — Assessment & Plan Note (Signed)
Chronic.  Controlled.  Continue with current medication regimen.  Labs ordered today.  Return to clinic in 3 months for reevaluation.  Call sooner if concerns arise.

## 2022-06-12 NOTE — Assessment & Plan Note (Signed)
Chronic.  Controlled.  Had to switch medication to Ozempic due to insurance purposes.  A1c has been well controlled at 5.6.   Labs ordered today. Will make recommendations based on lab results.  Return to clinic in 3 months for reevaluation.  Call sooner if concerns arise.

## 2022-06-12 NOTE — Assessment & Plan Note (Signed)
Chronic.  Labs ordered today.  Following up with Nephrology for ongoing management in kidney disease. Continue with weight loss and continue with recommendations per nephrology.

## 2022-06-12 NOTE — Assessment & Plan Note (Signed)
Chronic. Not well controlled.  Was doing very well on Rexulti.  Then had to change to [place] due to insurance.  Having decreased concentration, decreased motivation and more mood swings not being on the [place].  Will prescribe medication and request approval from insurance company on appeal.  Will lat patient know the results.

## 2022-06-12 NOTE — Progress Notes (Signed)
BP 120/81   Pulse (!) 58   Temp 97.8 F (36.6 C) (Oral)   Wt 224 lb 4.8 oz (101.7 kg)   SpO2 97%   BMI 36.76 kg/m    Subjective:    Patient ID: Angela Mullen, female    DOB: November 19, 1955, 66 y.o.   MRN: [id number]  HPI: Angela Mullen is a 66 y.o. female  Chief Complaint  Patient presents with   Medication Refill        Diabetic Eye Exam    No recent or upcoming eye exam per patient.    Hypertension   Anxiety   Depression   ANXIETY/DEPRESSION Patient states she feels like the Latuda helps some but not as much as the [place].  She has more motivation and concentration and less mood swings.  She was previously using a coupon when she wasn't on Medicare.  Denies SI.   [place] Office Visit from 03/22/2022 in [place]  PHQ-9 Total Score 17         03/22/2022    9:12 AM 01/18/2022    2:41 PM 12/20/2021    8:27 AM 11/15/2021    9:29 AM  GAD 7 : Generalized Anxiety Score  Nervous, Anxious, on Edge 1 0 1 1  Control/stop worrying 1 0 0 1  Worry too much - different things 1 0 1 1  Trouble relaxing 1 0 0 1  Restless 1 0 1 1  Easily annoyed or irritable 2 0 0 1  Afraid - awful might happen 0 0 0 0  Total GAD 7 Score 7 0 3 6  Anxiety Difficulty Somewhat difficult Not difficult at all Somewhat difficult Somewhat difficult    CHRONIC KIDNEY DISEASE Patient has been seeing the nephrologist for CKD. They discussed weight loss options to help control diabetes and CKD. Patient has changed her diet and lost some weight. She has a follow up coming up with Dr. Calvert Cantor.   HYPERTENSION Hypertension status: controlled  Satisfied with current treatment? no Duration of hypertension: years BP monitoring frequency:  not checking BP range:  BP medication side effects:  no Medication compliance: excellent compliance Previous BP meds: atenolol, amlodipine, and lisinopril Aspirin: no Recurrent headaches: no Visual changes: no Palpitations: no Dyspnea:  no Chest pain: no Lower extremity edema: no Dizzy/lightheaded: no  DIABETES Still taking Ozempic weekly.  Feels like it is helping her symptoms.  Hypoglycemic episodes:no Polydipsia/polyuria: no Visual disturbance: no Chest pain: no Paresthesias: no Glucose Monitoring: no  Accucheck frequency: Not Checking  Fasting glucose:  Post prandial:  Evening:  Before meals: Taking Insulin?: no  Long acting insulin:  Short acting insulin: Blood Pressure Monitoring: not checking Retinal Examination: Not up to Date Foot Exam: Up to Date Diabetic Education: Not Completed Pneumovax: Up to Date Influenza: Up to Date Aspirin: no   Denies HA, CP, SOB, dizziness, palpitations, visual changes, and lower extremity swelling. Denies polyuria and polydipsia.  Relevant past medical, surgical, family and social history reviewed and updated as indicated. Interim medical history since our last visit reviewed. Allergies and medications reviewed and updated.  Review of Systems  Eyes:  Negative for visual disturbance.  Respiratory:  Negative for cough, chest tightness and shortness of breath.   Cardiovascular:  Negative for chest pain, palpitations and leg swelling.  Endocrine: Negative for polydipsia and polyuria.  Musculoskeletal:        Right toe pain  Neurological:  Negative for dizziness and headaches.  Psychiatric/Behavioral:  Positive for  dysphoric mood. Negative for suicidal ideas. The patient is nervous/anxious.     Per HPI unless specifically indicated above     Objective:    BP 120/81   Pulse (!) 58   Temp 97.8 F (36.6 C) (Oral)   Wt 224 lb 4.8 oz (101.7 kg)   SpO2 97%   BMI 36.76 kg/m   Wt Readings from Last 3 Encounters:  06/12/22 224 lb 4.8 oz (101.7 kg)  03/22/22 225 lb 9.6 oz (102.3 kg)  01/24/22 225 lb 8 oz (102.3 kg)    Physical Exam Vitals and nursing note reviewed.  Constitutional:      General: She is not in acute distress.    Appearance: Normal appearance.  She is obese. She is not ill-appearing, toxic-appearing or diaphoretic.  HENT:     Head: Normocephalic.     Right Ear: External ear normal.     Left Ear: External ear normal.     Nose: Nose normal.     Mouth/Throat:     Mouth: Mucous membranes are moist.     Pharynx: Oropharynx is clear.  Eyes:     General:        Right eye: No discharge.        Left eye: No discharge.     Extraocular Movements: Extraocular movements intact.     Conjunctiva/sclera: Conjunctivae normal.     Pupils: Pupils are equal, round, and reactive to light.  Cardiovascular:     Rate and Rhythm: Normal rate and regular rhythm.     Heart sounds: No murmur heard. Pulmonary:     Effort: Pulmonary effort is normal. No respiratory distress.     Breath sounds: Normal breath sounds. No wheezing or rales.  Musculoskeletal:     Cervical back: Normal range of motion and neck supple.  Skin:    General: Skin is warm and dry.     Capillary Refill: Capillary refill takes less than 2 seconds.  Neurological:     General: No focal deficit present.     Mental Status: She is alert and oriented to person, place, and time. Mental status is at baseline.  Psychiatric:        Mood and Affect: Mood normal.        Behavior: Behavior normal.        Thought Content: Thought content normal.        Judgment: Judgment normal.     Results for orders placed or performed in visit on 03/22/22  Comp Met (CMET)  Result Value Ref Range   Glucose 107 (H) 70 - 99 mg/dL   BUN 13 8 - 27 mg/dL   Creatinine, Ser 1.66 (H) 0.57 - 1.00 mg/dL   eGFR 34 (L) >59 mL/min/1.73   BUN/Creatinine Ratio 8 (L) 12 - 28   Sodium 141 134 - 144 mmol/L   Potassium 3.8 3.5 - 5.2 mmol/L   Chloride 107 (H) 96 - 106 mmol/L   CO2 20 20 - 29 mmol/L   Calcium 8.9 8.7 - 10.3 mg/dL   Total Protein 5.9 (L) 6.0 - 8.5 g/dL   Albumin 4.1 3.8 - 4.8 g/dL   Globulin, Total 1.8 1.5 - 4.5 g/dL   Albumin/Globulin Ratio 2.3 (H) 1.2 - 2.2   Bilirubin Total 0.2 0.0 - 1.2  mg/dL   Alkaline Phosphatase 119 44 - 121 IU/L   AST 20 0 - 40 IU/L   ALT 25 0 - 32 IU/L  Lipid Profile  Result Value Ref Range  Cholesterol, Total 160 100 - 199 mg/dL   Triglycerides 74 0 - 149 mg/dL   HDL 59 >39 mg/dL   VLDL Cholesterol Cal 14 5 - 40 mg/dL   LDL Chol Calc (NIH) 87 0 - 99 mg/dL   Chol/HDL Ratio 2.7 0.0 - 4.4 ratio  HgB A1c  Result Value Ref Range   Hgb A1c MFr Bld 5.6 4.8 - 5.6 %   Est. average glucose Bld gHb Est-mCnc 114 mg/dL      Assessment & Plan:   Problem List Items Addressed This Visit       Cardiovascular and Mediastinum   Essential (primary) hypertension - Primary    Chronic.  Controlled.  Continue with current medication regimen of Atenolol 88m, Amlodipine 573m and Lisinopril 2053mRefilled Medications today. Labs ordered today.  Return to clinic in 3 months for reevaluation.  Call sooner if concerns arise.        Relevant Orders   Comp Met (CMET)     Respiratory   Chronic obstructive pulmonary disease (HCC)    Chronic.  Controlled.  Continue with current medication regimen.  Labs ordered today.  Return to clinic in 3 months for reevaluation.  Call sooner if concerns arise.          Endocrine   Hyperlipidemia associated with type 2 diabetes mellitus (HCC)    Chronic.  Controlled.  Continue with current medication regimen on Lovastatin 9m44mLabs ordered today.  Return to clinic in 3 months for reevaluation.  Call sooner if concerns arise.        Relevant Orders   Lipid Profile   DM (diabetes mellitus), type 2 with complications (HCC)    Chronic.  Controlled.  Had to switch medication to Ozempic due to insurance purposes.  A1c has been well controlled at 5.6.   Labs ordered today. Will make recommendations based on lab results.  Return to clinic in 3 months for reevaluation.  Call sooner if concerns arise.        Relevant Orders   HgB A1c     Genitourinary   Chronic kidney disease (CKD), stage III (moderate) (HCC)    Chronic.   Labs ordered today.  Following up with Nephrology for ongoing management in kidney disease. Continue with weight loss and continue with recommendations per nephrology.         Other   Depression, recurrent (HCC)[place] Chronic. Not well controlled.  Was doing very well on Rexulti.  Then had to change to [place] to insurance.  Having decreased concentration, decreased motivation and more mood swings not being on the Rexu[place]ill prescribe medication and request approval from insurance company on appeal.  Will lat patient know the results.      Anxiety    Chronic. Not well controlled.  Was doing very well on Rexulti.  Then had to change to [place] to insurance.  Having decreased concentration, decreased motivation and more mood swings not being on the RexuSabinalill prescribe medication and request approval from insurance company on appeal.  Will lat patient know the results.        Follow up plan: Return in about 3 months (around 09/11/2022) for HTN, HLD, DM2 FU.

## 2022-06-12 NOTE — Assessment & Plan Note (Signed)
Chronic. Not well controlled.  Was doing very well on Rexulti.  Then had to change to [place] due to insurance.  Having decreased concentration, decreased motivation and more mood swings not being on the [place].  Will prescribe medication and request approval from insurance company on appeal.  Will lat patient know the results.

## 2022-06-13 ENCOUNTER — Telehealth: Payer: Self-pay

## 2022-06-13 LAB — COMPREHENSIVE METABOLIC PANEL WITH GFR
ALT: 19 [IU]/L (ref 0–32)
AST: 20 [IU]/L (ref 0–40)
Albumin/Globulin Ratio: 1.9 (ref 1.2–2.2)
Albumin: 4.2 g/dL (ref 3.9–4.9)
Alkaline Phosphatase: 91 [IU]/L (ref 44–121)
BUN/Creatinine Ratio: 15 (ref 12–28)
BUN: 26 mg/dL (ref 8–27)
Bilirubin Total: 0.2 mg/dL (ref 0.0–1.2)
CO2: 18 mmol/L — ABNORMAL LOW (ref 20–29)
Calcium: 9 mg/dL (ref 8.7–10.3)
Chloride: 111 mmol/L — ABNORMAL HIGH (ref 96–106)
Creatinine, Ser: 1.7 mg/dL — ABNORMAL HIGH (ref 0.57–1.00)
Globulin, Total: 2.2 g/dL (ref 1.5–4.5)
Glucose: 84 mg/dL (ref 70–99)
Potassium: 5.4 mmol/L — ABNORMAL HIGH (ref 3.5–5.2)
Sodium: 141 mmol/L (ref 134–144)
Total Protein: 6.4 g/dL (ref 6.0–8.5)
eGFR: 33 mL/min/{1.73_m2} — ABNORMAL LOW

## 2022-06-13 LAB — HEMOGLOBIN A1C
Est. average glucose Bld gHb Est-mCnc: 111 mg/dL
Hgb A1c MFr Bld: 5.5 % (ref 4.8–5.6)

## 2022-06-13 LAB — LIPID PANEL
Chol/HDL Ratio: 2.4 ratio (ref 0.0–4.4)
Cholesterol, Total: 141 mg/dL (ref 100–199)
HDL: 59 mg/dL (ref >39)
LDL Chol Calc (NIH): 65 mg/dL (ref 0–99)
Triglycerides: 93 mg/dL (ref 0–149)
VLDL Cholesterol Cal: 17 mg/dL (ref 5–40)

## 2022-06-13 NOTE — Telephone Encounter (Signed)
PA for Rexulti has been initiated via covermymeds. Awaiting determination   Key: [id number]

## 2022-06-13 NOTE — Progress Notes (Signed)
HI Angela Mullen. It was nice to see you yesterday.  Your lab work looks good.  Your kidney function bumped up some.  Make sure you are avoiding any ibuprofen, motrin or aleve and drinking plenty of water daily.  Continue with your current medication regimen.  Follow up as discussed.  Please let me know if you have any questions.

## 2022-06-14 ENCOUNTER — Telehealth: Payer: Self-pay | Admitting: Nurse Practitioner

## 2022-06-14 NOTE — Telephone Encounter (Signed)
Copied from [place] [phone]. Topic: General - Inquiry >> Jun 14, 2022 10:27 AM Marcellus Scott wrote: Reason for CRM: Gerhard Perches from [facility] is calling to advice of approval on medication Rexulti.  Approved effective 06/13/2022, good through 06/14/2023.  Pt has been notified and will receive a letter.

## 2022-06-20 ENCOUNTER — Telehealth: Payer: Self-pay | Admitting: Nurse Practitioner

## 2022-06-20 NOTE — Telephone Encounter (Signed)
Can we find out if patient was able to get the Rexutil.

## 2022-06-20 NOTE — Telephone Encounter (Signed)
LVMTRC 

## 2022-07-17 DIAGNOSIS — N1832 Chronic kidney disease, stage 3b: Secondary | ICD-10-CM | POA: Diagnosis not present

## 2022-07-17 DIAGNOSIS — R8290 Unspecified abnormal findings in urine: Secondary | ICD-10-CM | POA: Diagnosis not present

## 2022-07-17 DIAGNOSIS — R809 Proteinuria, unspecified: Secondary | ICD-10-CM | POA: Diagnosis not present

## 2022-07-17 DIAGNOSIS — E118 Type 2 diabetes mellitus with unspecified complications: Secondary | ICD-10-CM | POA: Diagnosis not present

## 2022-07-24 DIAGNOSIS — H2513 Age-related nuclear cataract, bilateral: Secondary | ICD-10-CM | POA: Diagnosis not present

## 2022-07-24 DIAGNOSIS — E119 Type 2 diabetes mellitus without complications: Secondary | ICD-10-CM | POA: Diagnosis not present

## 2022-07-24 LAB — HM DIABETES EYE EXAM

## 2022-07-26 DIAGNOSIS — Z9884 Bariatric surgery status: Secondary | ICD-10-CM | POA: Diagnosis not present

## 2022-07-26 DIAGNOSIS — M1000 Idiopathic gout, unspecified site: Secondary | ICD-10-CM | POA: Diagnosis not present

## 2022-07-26 DIAGNOSIS — E663 Overweight: Secondary | ICD-10-CM | POA: Diagnosis not present

## 2022-07-26 DIAGNOSIS — R809 Proteinuria, unspecified: Secondary | ICD-10-CM | POA: Diagnosis not present

## 2022-07-27 ENCOUNTER — Other Ambulatory Visit: Payer: Self-pay | Admitting: Nurse Practitioner

## 2022-07-27 NOTE — Telephone Encounter (Signed)
Requested medications are due for refill today.  yes  Requested medications are on the active medications list.  yes  Last refill. 03/22/2022 #60 2 rf  Future visit scheduled.   yes  Notes to clinic.  Refill not delegated.    Requested Prescriptions  Pending Prescriptions Disp Refills   pregabalin (LYRICA) 150 MG capsule [Pharmacy Med Name: Pregabalin 150 MG Oral Capsule] 60 capsule 0    Sig: Take 2 capsules by mouth once daily     Not Delegated - Neurology:  Anticonvulsants - Controlled - pregabalin Failed - 07/27/2022 10:13 AM      Failed - This refill cannot be delegated      Failed - Cr in normal range and within 360 days    Creatinine  Date Value Ref Range Status  05/11/2013 0.97 0.60 - 1.30 mg/dL Final   Creatinine, Ser  Date Value Ref Range Status  06/12/2022 1.70 (H) 0.57 - 1.00 mg/dL Final         Passed - Completed PHQ-2 or PHQ-9 in the last 360 days      Passed - Valid encounter within last 12 months    Recent Outpatient Visits           1 month ago Essential (primary) hypertension   [place], NP   4 months ago Essential (primary) hypertension   [facility] Jon Billings, NP   6 months ago Cold sore   [place] Vigg, Avanti, MD   7 months ago Depression, recurrent ([place])   [place], Karen, NP   8 months ago Essential (primary) hypertension   [facility] Jon Billings, NP       Future Appointments             In 1 month Jon Billings, NP [facility], [place]

## 2022-07-27 NOTE — Telephone Encounter (Signed)
Med refill request for lyrica 150. Last filled 03/22/2022 #60 2 RF, Next office visit 09/11/2022. Last seen in office 06/12/2022

## 2022-08-09 ENCOUNTER — Telehealth: Payer: Self-pay

## 2022-08-09 NOTE — Telephone Encounter (Signed)
Entered in error

## 2022-09-04 ENCOUNTER — Other Ambulatory Visit: Payer: Self-pay | Admitting: Nurse Practitioner

## 2022-09-05 NOTE — Telephone Encounter (Signed)
Requested medication (s) are due for refill today - yes  Requested medication (s) are on the active medication list -yes  Future visit scheduled -yes  Last refill: 07/27/22 #60  Notes to clinic: non delegated Rx  Requested Prescriptions  Pending Prescriptions Disp Refills   pregabalin (LYRICA) 150 MG capsule [Pharmacy Med Name: Pregabalin 150 MG Oral Capsule] 60 capsule 0    Sig: Take 2 capsules by mouth once daily     Not Delegated - Neurology:  Anticonvulsants - Controlled - pregabalin Failed - 09/04/2022  6:01 PM      Failed - This refill cannot be delegated      Failed - Cr in normal range and within 360 days    Creatinine  Date Value Ref Range Status  05/11/2013 0.97 0.60 - 1.30 mg/dL Final   Creatinine, Ser  Date Value Ref Range Status  06/12/2022 1.70 (H) 0.57 - 1.00 mg/dL Final         Passed - Completed PHQ-2 or PHQ-9 in the last 360 days      Passed - Valid encounter within last 12 months    Recent Outpatient Visits           2 months ago Essential (primary) hypertension   [place], Karen, NP   5 months ago Essential (primary) hypertension   [place], Karen, NP   7 months ago Cold sore   [place] Vigg, Avanti, MD   8 months ago Depression, recurrent ([place])   [place], Karen, NP   9 months ago Essential (primary) hypertension   [place] Jon Billings, NP       Future Appointments             In 6 days Jon Billings, NP [place], [place]               Requested Prescriptions  Pending Prescriptions Disp Refills   pregabalin (LYRICA) 150 MG capsule [Pharmacy Med Name: Pregabalin 150 MG Oral Capsule] 60 capsule 0    Sig: Take 2 capsules by mouth once daily     Not Delegated - Neurology:  Anticonvulsants - Controlled - pregabalin Failed - 09/04/2022  6:01 PM      Failed - This refill cannot be delegated      Failed  - Cr in normal range and within 360 days    Creatinine  Date Value Ref Range Status  05/11/2013 0.97 0.60 - 1.30 mg/dL Final   Creatinine, Ser  Date Value Ref Range Status  06/12/2022 1.70 (H) 0.57 - 1.00 mg/dL Final         Passed - Completed PHQ-2 or PHQ-9 in the last 360 days      Passed - Valid encounter within last 12 months    Recent Outpatient Visits           2 months ago Essential (primary) hypertension   [place], NP   5 months ago Essential (primary) hypertension   [facility] Jon Billings, NP   7 months ago Cold sore   [place] Vigg, Avanti, MD   8 months ago Depression, recurrent [facility])   [place], Karen, NP   9 months ago Essential (primary) hypertension   [facility] Jon Billings, NP       Future Appointments             In 6 days Jon Billings, NP  [place], [facility]

## 2022-09-07 NOTE — Progress Notes (Deleted)
There were no vitals taken for this visit.   Subjective:    Patient ID: Angela Mullen, female    DOB: 07-14-1956, 66 y.o.   MRN: [id number]  HPI: Angela Mullen is a 66 y.o. female presenting on 09/11/2022 for comprehensive medical examination. Current medical complaints include:{Blank single:19197::"none","***"}  She currently lives with: Menopausal Symptoms: {Blank single:19197::"yes","no"}  ANXIETY/DEPRESSION Patient states she feels like the [place] helps some but not as much as the [place].  She has more motivation and concentration and less mood swings.  She was previously using a coupon when she wasn't on Medicare.  Denies SI.   [place] Office Visit from 03/22/2022 in [place]  PHQ-9 Total Score 17         03/22/2022    9:12 AM 01/18/2022    2:41 PM 12/20/2021    8:27 AM 11/15/2021    9:29 AM  GAD 7 : Generalized Anxiety Score  Nervous, Anxious, on Edge 1 0 1 1  Control/stop worrying 1 0 0 1  Worry too much - different things 1 0 1 1  Trouble relaxing 1 0 0 1  Restless 1 0 1 1  Easily annoyed or irritable 2 0 0 1  Afraid - awful might happen 0 0 0 0  Total GAD 7 Score 7 0 3 6  Anxiety Difficulty Somewhat difficult Not difficult at all Somewhat difficult Somewhat difficult    CHRONIC KIDNEY DISEASE Patient has been seeing the nephrologist for CKD. They discussed weight loss options to help control diabetes and CKD. Patient has changed her diet and lost some weight. She has a follow up coming up with Dr. Calvert Cantor.   HYPERTENSION Hypertension status: controlled  Satisfied with current treatment? no Duration of hypertension: years BP monitoring frequency:  not checking BP range:  BP medication side effects:  no Medication compliance: excellent compliance Previous BP meds:atenolol, amlodipine, and lisinopril Aspirin: no Recurrent headaches: no Visual changes: no Palpitations: no Dyspnea: no Chest pain: no Lower extremity edema:  no Dizzy/lightheaded: no  DIABETES Still taking Ozempic weekly.  Feels like it is helping her symptoms.  Hypoglycemic episodes:no Polydipsia/polyuria: no Visual disturbance: no Chest pain: no Paresthesias: no Glucose Monitoring: no  Accucheck frequency: Not Checking  Fasting glucose:  Post prandial:  Evening:  Before meals: Taking Insulin?: no  Long acting insulin:  Short acting insulin: Blood Pressure Monitoring: not checking Retinal Examination: Not up to Date Foot Exam: Up to Date Diabetic Education: Not Completed Pneumovax: Up to Date Influenza: Up to Date Aspirin: no  Functional Status Survey:       03/22/2022    9:12 AM 01/18/2022    2:40 PM 12/20/2021    8:27 AM 11/15/2021    9:29 AM 12/02/2020   10:00 AM  Fall Risk   Falls in the past year? 0 0 0 0 0  Number falls in past yr: 0 0 0 0 0  Injury with Fall? 0 0 0 0 0  Risk for fall due to : No Fall Risks No Fall Risks No Fall Risks No Fall Risks   Follow up Falls evaluation completed Falls evaluation completed Falls evaluation completed Falls evaluation completed     Depression Screen    03/22/2022    9:12 AM 01/18/2022    2:40 PM 12/20/2021    8:27 AM 11/15/2021    9:29 AM 07/20/2021    9:58 AM  Depression screen PHQ 2/9  Decreased Interest 2 0 1 2 1  Down, Depressed, Hopeless 2 0 1 2 1   PHQ - 2 Score 4 0 2 4 2   Altered sleeping 2 0 1 2 1   Tired, decreased energy 3 0 1 2 2   Change in appetite 2 0 2 2 1   Feeling bad or failure about yourself  2 0 1 2 1   Trouble concentrating 2 0 0 2 1  Moving slowly or fidgety/restless 2 0 1 2 1   Suicidal thoughts 0 0 0 0 0  PHQ-9 Score 17 0 8 16 9   Difficult doing work/chores Not difficult at all Not difficult at all Somewhat difficult Somewhat difficult Not difficult at all     Advanced Directives Does patient have a HCPOA?    {Blank single:19197::"yes","no"} If yes, name and contact information:  Does patient have a living will or MOST form?  {Blank  single:19197::"yes","no"}  Past Medical History:  Past Medical History:  Diagnosis Date   Abnormal liver enzymes 09/03/2014   Acid reflux    Allergy    Anemia    H/O   Anxiety    Arthritis    Cataract    COPD (chronic obstructive pulmonary disease) (HCC)    Depression    Diabetes mellitus without complication (HCC)    Dyspnea    WITH EXERTION DUE TO COPD   Elevation of level of transaminase or lactic acid dehydrogenase (LDH) 08/27/2012   Fatty liver    Headache    H/O MIGRAINES   High blood pressure    High cholesterol    Sleep apnea    BIPAP    Surgical History:  Past Surgical History:  Procedure Laterality Date   BARIATRIC SURGERY  06/15/2017   CARDIAC CATHETERIZATION     COLONOSCOPY WITH PROPOFOL N/A 10/10/2018   Procedure: COLONOSCOPY WITH PROPOFOL;  Surgeon: Jonathon Bellows, MD;  Location: [facility] ENDOSCOPY;  Service: Gastroenterology;  Laterality: N/A;   FRACTURE SURGERY  1990   RIGHT LEG    KNEE SURGERY     REPLACEMENT TOTAL KNEE Left    TONSILLECTOMY     TOTAL ABDOMINAL HYSTERECTOMY W/ BILATERAL SALPINGOOPHORECTOMY     TOTAL KNEE ARTHROPLASTY Left 04/28/2020   Procedure: TOTAL KNEE ARTHROPLASTY;  Surgeon: Lovell Sheehan, MD;  Location: [facility] ORS;  Service: Orthopedics;  Laterality: Left;   TUBAL LIGATION      Medications:  Current Outpatient Medications on File Prior to Visit  Medication Sig   pregabalin (LYRICA) 150 MG capsule Take 2 capsules by mouth once daily   acetaminophen (TYLENOL) 500 MG tablet Take 1,000 mg by mouth every 6 (six) hours as needed for moderate pain.    albuterol (VENTOLIN HFA) 108 (90 Base) MCG/ACT inhaler Inhale 1-2 puffs into the lungs every 6 (six) hours as needed for wheezing or shortness of breath.   amLODipine (NORVASC) 5 MG tablet Take 1 tablet by mouth once daily   atenolol (TENORMIN) 50 MG tablet Take 1 tablet (68m) in the morning and 2 tablets (1063m in the evening.   brexpiprazole (REXULTI) 0.25 MG TABS tablet Take 1 tablet  (0.25 mg total) by mouth daily.   buPROPion (WELLBUTRIN SR) 150 MG 12 hr tablet Take 1 tablet (150 mg total) by mouth 2 (two) times daily.   clotrimazole-betamethasone (LOTRISONE) cream APPLY  CREAM TOPICALLY TWICE DAILY AS NEEDED   colchicine 0.6 MG tablet Take 1 tablet (0.6 mg total) by mouth daily. Take at the onset of symptoms for 5 days   DULoxetine (CYMBALTA) 30 MG capsule Take 1 capsule (30 mg  total) by mouth at bedtime.   DULoxetine (CYMBALTA) 60 MG capsule Take 1 capsule (60 mg total) by mouth at bedtime.   fexofenadine (ALLEGRA ALLERGY) 180 MG tablet Take 1 tablet (180 mg total) by mouth daily.   lisinopril (ZESTRIL) 20 MG tablet Take 1 tablet (20 mg total) by mouth daily.   lovastatin (MEVACOR) 40 MG tablet Take 1 tablet (40 mg total) by mouth at bedtime.   lurasidone (LATUDA) 20 MG TABS tablet    methocarbamol (ROBAXIN) 750 MG tablet TAKE 1 TABLET (750 MG TOTAL) BY MOUTH EVERY 8 (EIGHT) HOURS AS NEEDED.   omeprazole (PRILOSEC) 40 MG capsule Take 1 capsule (40 mg total) by mouth daily.   Semaglutide,0.25 or 0.5MG/DOS, (OZEMPIC, 0.25 OR 0.5 MG/DOSE,) 2 MG/1.5ML SOPN Inject 0.25 mg into the skin once a week. Start with 0.25MG once a week x 4 weeks, then increase to 0.5MG weekly.   traZODone (DESYREL) 100 MG tablet Take 2 tablets (200 mg total) by mouth at bedtime as needed for sleep.   No current facility-administered medications on file prior to visit.    Allergies:  Allergies  Allergen Reactions   Ketorolac Other (See Comments)    Altered Mental Status-PT DENIES THIS ALLERGY   Prednisone Other (See Comments)    "wheezing"   Nsaids     DUE TO HAVING GASTRIC BYPASS SURGERY    Social History:  Social History   Socioeconomic History   Marital status: Married    Spouse name: Not on file   Number of children: Not on file   Years of education: Not on file   Highest education level: Not on file  Occupational History   Not on file  Tobacco Use   Smoking status: Never    Smokeless tobacco: Never  Vaping Use   Vaping Use: Never used  Substance and Sexual Activity   Alcohol use: Yes    Comment: occassional   Drug use: No   Sexual activity: Not Currently  Other Topics Concern   Not on file  Social History Narrative   Not on file   Social Determinants of Health   Financial Resource Strain: Not on file  Food Insecurity: Not on file  Transportation Needs: Not on file  Physical Activity: Not on file  Stress: Not on file  Social Connections: Not on file  Intimate Partner Violence: Not on file   Social History   Tobacco Use  Smoking Status Never  Smokeless Tobacco Never   Social History   Substance and Sexual Activity  Alcohol Use Yes   Comment: occassional    Family History:  Family History  Problem Relation Age of Onset   Cerebral aneurysm Mother    Stroke Father    Diabetes Father    Depression Sister    Obesity Sister    Kidney disease Sister    Heart attack Sister    Stroke Sister    Hypertension Sister    Diabetes Brother    High blood pressure Brother    Heart attack Maternal Grandfather    Hyperlipidemia Son    Stroke Maternal Grandmother    Diabetes Paternal Grandfather    Hyperlipidemia Paternal Grandfather    Heart disease Paternal Grandfather     Past medical history, surgical history, medications, allergies, family history and social history reviewed with patient today and changes made to appropriate areas of the chart.   ROS  All other ROS negative except what is listed above and in the HPI.  Objective:    There were no vitals taken for this visit.  Wt Readings from Last 3 Encounters:  06/12/22 224 lb 4.8 oz (101.7 kg)  03/22/22 225 lb 9.6 oz (102.3 kg)  01/24/22 225 lb 8 oz (102.3 kg)    No results found.  Physical Exam      No data to display          Cognitive Testing - 6-CIT  Correct? Score   What year is it? {YES NO:22349} {Numbers; 0-4:31231} Yes = 0    No = 4  What month is it?  {YES NO:22349} {Numbers; 0-4:31231} Yes = 0    No = 3  Remember:     Pia Mau, [place], [place]     What time is it? {YES NO:22349} {Numbers; 0-4:31231} Yes = 0    No = 3  Count backwards from 20 to 1 {YES NO:22349} {Numbers; 0-4:31231} Correct = 0    1 error = 2   More than 1 error = 4  Say the months of the year in reverse. {YES NO:22349} {Numbers; 0-4:31231} Correct = 0    1 error = 2   More than 1 error = 4  What address did I ask you to remember? {YES NO:22349} {NUMBERS; 0-10:5044} Correct = 0  1 error = 2    2 error = 4    3 error = 6    4 error = 8    All wrong = 10       TOTAL SCORE  {Numbers; 2-70:35009}/38   Interpretation:  {Desc; normal/abnormal:11317::"Normal"}  Normal (0-7) Abnormal (8-28)   Results for orders placed or performed in visit on 08/09/22  HM DIABETES EYE EXAM  Result Value Ref Range   HM Diabetic Eye Exam No Retinopathy No Retinopathy      Assessment & Plan:   Problem List Items Addressed This Visit       Cardiovascular and Mediastinum   Essential (primary) hypertension - Primary     Respiratory   Chronic obstructive pulmonary disease ([place])     Endocrine   Hyperlipidemia associated with type 2 diabetes mellitus ([place])   DM (diabetes mellitus), type 2 with complications ([place])     Genitourinary   Chronic kidney disease (CKD), stage III (moderate) (HCC)     Other   Generalized anxiety disorder   Morbid obesity with body mass index of 40.0-49.9 (HCC)   Depression, recurrent (HCC)     Preventative Services:  AAA screening:  Health Risk Assessment and Personalized Prevention Plan: Bone Mass Measurements: Breast Cancer Screening: CVD Screening:  Cervical Cancer Screening: Colon Cancer Screening:  Depression Screening:  Diabetes Screening:  Glaucoma Screening:  Hepatitis B vaccine: Hepatitis C screening:  HIV Screening: Flu Vaccine: Lung cancer Screening: Obesity Screening:  Pneumonia Vaccines (2): STI Screening:  Follow up  plan: No follow-ups on file.   LABORATORY TESTING:  - Pap smear: {Blank HWEXHB:71696::"VEL done","not applicable","up to date","done elsewhere"}  IMMUNIZATIONS:   - Tdap: Tetanus vaccination status reviewed: {tetanus status:315746}. - Influenza: {Blank single:19197::"Up to date","Administered today","Postponed to flu season","Refused","Given elsewhere"} - Pneumovax: {Blank single:19197::"Up to date","Administered today","Not applicable","Refused","Given elsewhere"} - Prevnar: {Blank single:19197::"Up to date","Administered today","Not applicable","Refused","Given elsewhere"} - Zostavax vaccine: {Blank single:19197::"Up to date","Administered today","Not applicable","Refused","Given elsewhere"}  SCREENING: -Mammogram: {Blank single:19197::"Up to date","Ordered today","Not applicable","Refused","Done elsewhere"}  - Colonoscopy: {Blank single:19197::"Up to date","Ordered today","Not applicable","Refused","Done elsewhere"}  - Bone Density: {Blank single:19197::"Up to date","Ordered today","Not applicable","Refused","Done elsewhere"}  -Hearing Test: {Blank single:19197::"Up to date","Ordered today","Not  applicable","Refused","Done elsewhere"}  -Spirometry: {Blank single:19197::"Up to date","Ordered today","Not applicable","Refused","Done elsewhere"}   PATIENT COUNSELING:   Advised to take 1 mg of folate supplement per day if capable of pregnancy.   Sexuality: Discussed sexually transmitted diseases, partner selection, use of condoms, avoidance of unintended pregnancy  and contraceptive alternatives.   Advised to avoid cigarette smoking.  I discussed with the patient that most people either abstain from alcohol or drink within safe limits (<=14/week and <=4 drinks/occasion for males, <=7/weeks and <= 3 drinks/occasion for females) and that the risk for alcohol disorders and other health effects rises proportionally with the number of drinks per week and how often a drinker exceeds daily  limits.  Discussed cessation/primary prevention of drug use and availability of treatment for abuse.   Diet: Encouraged to adjust caloric intake to maintain  or achieve ideal body weight, to reduce intake of dietary saturated fat and total fat, to limit sodium intake by avoiding high sodium foods and not adding table salt, and to maintain adequate dietary potassium and calcium preferably from fresh fruits, vegetables, and low-fat dairy products.    stressed the importance of regular exercise  Injury prevention: Discussed safety belts, safety helmets, smoke detector, smoking near bedding or upholstery.   Dental health: Discussed importance of regular tooth brushing, flossing, and dental visits.    NEXT PREVENTATIVE PHYSICAL DUE IN 1 YEAR. No follow-ups on file.

## 2022-09-11 ENCOUNTER — Encounter: Payer: Self-pay | Admitting: Nurse Practitioner

## 2022-09-11 ENCOUNTER — Ambulatory Visit (INDEPENDENT_AMBULATORY_CARE_PROVIDER_SITE_OTHER): Payer: Medicare Other | Admitting: Nurse Practitioner

## 2022-09-11 VITALS — BP 125/75 | HR 60 | Temp 97.4°F | Wt 225.9 lb

## 2022-09-11 DIAGNOSIS — I10 Essential (primary) hypertension: Secondary | ICD-10-CM

## 2022-09-11 DIAGNOSIS — J449 Chronic obstructive pulmonary disease, unspecified: Secondary | ICD-10-CM | POA: Diagnosis not present

## 2022-09-11 DIAGNOSIS — E785 Hyperlipidemia, unspecified: Secondary | ICD-10-CM | POA: Diagnosis not present

## 2022-09-11 DIAGNOSIS — Z7189 Other specified counseling: Secondary | ICD-10-CM

## 2022-09-11 DIAGNOSIS — Z23 Encounter for immunization: Secondary | ICD-10-CM | POA: Diagnosis not present

## 2022-09-11 DIAGNOSIS — E1169 Type 2 diabetes mellitus with other specified complication: Secondary | ICD-10-CM

## 2022-09-11 DIAGNOSIS — N1832 Chronic kidney disease, stage 3b: Secondary | ICD-10-CM

## 2022-09-11 DIAGNOSIS — E118 Type 2 diabetes mellitus with unspecified complications: Secondary | ICD-10-CM | POA: Diagnosis not present

## 2022-09-11 DIAGNOSIS — Z1231 Encounter for screening mammogram for malignant neoplasm of breast: Secondary | ICD-10-CM

## 2022-09-11 DIAGNOSIS — F411 Generalized anxiety disorder: Secondary | ICD-10-CM

## 2022-09-11 DIAGNOSIS — Z13820 Encounter for screening for osteoporosis: Secondary | ICD-10-CM

## 2022-09-11 DIAGNOSIS — F339 Major depressive disorder, recurrent, unspecified: Secondary | ICD-10-CM

## 2022-09-11 DIAGNOSIS — Z0000 Encounter for general adult medical examination without abnormal findings: Secondary | ICD-10-CM

## 2022-09-11 LAB — MICROALBUMIN, URINE WAIVED
Creatinine, Urine Waived: 100 mg/dL (ref 10–300)
Microalb, Ur Waived: 80 mg/L — ABNORMAL HIGH (ref 0–19)

## 2022-09-11 MED ORDER — DULOXETINE HCL 30 MG PO CPEP: 30.0000 mg | ORAL_CAPSULE | Freq: Every day | ORAL | 1 refills | Status: DC

## 2022-09-11 MED ORDER — PREGABALIN 150 MG PO CAPS: 300.0000 mg | ORAL_CAPSULE | Freq: Every day | ORAL | 2 refills | Status: DC

## 2022-09-11 MED ORDER — TRAZODONE HCL 100 MG PO TABS: 200.0000 mg | ORAL_TABLET | Freq: Every evening | ORAL | 1 refills | Status: DC | PRN

## 2022-09-11 MED ORDER — DULOXETINE HCL 60 MG PO CPEP: 60.0000 mg | ORAL_CAPSULE | Freq: Every day | ORAL | 1 refills | Status: DC

## 2022-09-11 MED ORDER — BUPROPION HCL ER (SR) 150 MG PO TB12: 150.0000 mg | ORAL_TABLET | Freq: Two times a day (BID) | ORAL | 1 refills | Status: DC

## 2022-09-11 MED ORDER — OMEPRAZOLE 40 MG PO CPDR: 40.0000 mg | DELAYED_RELEASE_CAPSULE | Freq: Every day | ORAL | 1 refills | Status: DC

## 2022-09-11 MED ORDER — LURASIDONE HCL 20 MG PO TABS: 20.0000 mg | ORAL_TABLET | Freq: Every day | ORAL | 1 refills | Status: DC

## 2022-09-11 NOTE — Assessment & Plan Note (Addendum)
Chronic.  Controlled.  On Trulicity and doing well.   A1c has been well controlled at 5.5.   Labs ordered today. Will make recommendations based on lab results.  Return to clinic in 3 months for reevaluation.  Call sooner if concerns arise.

## 2022-09-11 NOTE — Assessment & Plan Note (Signed)
Chronic.  Controlled.  Continue with current medication regimen.  Labs ordered today.  Return to clinic in 3 months for reevaluation.  Call sooner if concerns arise.

## 2022-09-11 NOTE — Progress Notes (Signed)
 BP 125/75   Pulse 60   Temp (!) 97.4 F (36.3 C) (Oral)   Wt 225 lb 14.4 oz (102.5 kg)   SpO2 97%   BMI 37.02 kg/m    Subjective:    Patient ID: Angela Mullen, female    DOB: 09/05/1956, 66 y.o.   MRN: [id number]  HPI: Angela Mullen is a 66 y.o. female  Chief Complaint  Patient presents with   Diabetes   Hyperlipidemia   Hypertension   ANXIETY/DEPRESSION Patient states she feels like the Latuda and feels like she is doing well.   She has more motivation and concentration and less mood swings.  She has been table to get the medication now that she is on Medicare.  Denies SI.   Flowsheet Row Office Visit from 09/11/2022 in [facility]  PHQ-9 Total Score 11         09/11/2022    9:34 AM 03/22/2022    9:12 AM 01/18/2022    2:41 PM 12/20/2021    8:27 AM  GAD 7 : Generalized Anxiety Score  Nervous, Anxious, on Edge 1 1 0 1  Control/stop worrying 1 1 0 0  Worry too much - different things 1 1 0 1  Trouble relaxing 1 1 0 0  Restless 1 1 0 1  Easily annoyed or irritable 1 2 0 0  Afraid - awful might happen 1 0 0 0  Total GAD 7 Score 7 7 0 3  Anxiety Difficulty Somewhat difficult Somewhat difficult Not difficult at all Somewhat difficult    CHRONIC KIDNEY DISEASE Patient has been seeing the nephrologist for CKD. They discussed weight loss options to help control diabetes and CKD. Patient has changed her diet and lost some weight. She has a follow up coming up with Dr. Lateef CKD status: controlled Medications renally dose: yes Previous renal evaluation: yes Pneumovax:  Up to Date Influenza Vaccine:  Up to Date   HYPERTENSION Hypertension status: controlled  Satisfied with current treatment? no Duration of hypertension: years BP monitoring frequency:  not checking BP range:  BP medication side effects:  no Medication compliance: excellent compliance Previous BP meds: atenolol, amlodipine, and lisinopril Aspirin: no Recurrent headaches:  no Visual changes: no Palpitations: no Dyspnea: no Chest pain: no Lower extremity edema: no Dizzy/lightheaded: no  DIABETES Still taking trulicity weekly.  Feels like it is helping her symptoms.  Hypoglycemic episodes:no Polydipsia/polyuria: no Visual disturbance: no Chest pain: no Paresthesias: no Glucose Monitoring: no  Accucheck frequency: Not Checking  Fasting glucose:  Post prandial:  Evening:  Before meals: Taking Insulin?: no  Long acting insulin:  Short acting insulin: Blood Pressure Monitoring: not checking Retinal Examination: Up to date Foot Exam: Up to Date Diabetic Education: Not Completed Pneumovax: Up to Date Influenza: Up to Date Aspirin: no   Denies HA, CP, SOB, dizziness, palpitations, visual changes, and lower extremity swelling. Denies polyuria and polydipsia.  Relevant past medical, surgical, family and social history reviewed and updated as indicated. Interim medical history since our last visit reviewed. Allergies and medications reviewed and updated.  Review of Systems  Eyes:  Negative for visual disturbance.  Respiratory:  Negative for cough, chest tightness and shortness of breath.   Cardiovascular:  Negative for chest pain, palpitations and leg swelling.  Endocrine: Negative for polydipsia and polyuria.  Neurological:  Negative for dizziness and headaches.  Psychiatric/Behavioral:  Positive for dysphoric mood. Negative for suicidal ideas. The patient is nervous/anxious.     Per   HPI unless specifically indicated above     Objective:    BP 125/75   Pulse 60   Temp (!) 97.4 F (36.3 C) (Oral)   Wt 225 lb 14.4 oz (102.5 kg)   SpO2 97%   BMI 37.02 kg/m   Wt Readings from Last 3 Encounters:  09/11/22 225 lb 14.4 oz (102.5 kg)  06/12/22 224 lb 4.8 oz (101.7 kg)  03/22/22 225 lb 9.6 oz (102.3 kg)    Physical Exam Vitals and nursing note reviewed.  Constitutional:      General: She is not in acute distress.    Appearance: Normal  appearance. She is obese. She is not ill-appearing, toxic-appearing or diaphoretic.  HENT:     Head: Normocephalic.     Right Ear: External ear normal.     Left Ear: External ear normal.     Nose: Nose normal.     Mouth/Throat:     Mouth: Mucous membranes are moist.     Pharynx: Oropharynx is clear.  Eyes:     General:        Right eye: No discharge.        Left eye: No discharge.     Extraocular Movements: Extraocular movements intact.     Conjunctiva/sclera: Conjunctivae normal.     Pupils: Pupils are equal, round, and reactive to light.  Cardiovascular:     Rate and Rhythm: Normal rate and regular rhythm.     Heart sounds: No murmur heard. Pulmonary:     Effort: Pulmonary effort is normal. No respiratory distress.     Breath sounds: Normal breath sounds. No wheezing or rales.  Musculoskeletal:     Cervical back: Normal range of motion and neck supple.  Skin:    General: Skin is warm and dry.     Capillary Refill: Capillary refill takes less than 2 seconds.  Neurological:     General: No focal deficit present.     Mental Status: She is alert and oriented to person, place, and time. Mental status is at baseline.  Psychiatric:        Mood and Affect: Mood normal.        Behavior: Behavior normal.        Thought Content: Thought content normal.        Judgment: Judgment normal.     Results for orders placed or performed in visit on 08/09/22  HM DIABETES EYE EXAM  Result Value Ref Range   HM Diabetic Eye Exam No Retinopathy No Retinopathy      Assessment & Plan:   Problem List Items Addressed This Visit       Cardiovascular and Mediastinum   Essential (primary) hypertension    Chronic.  Controlled.  Continue with current medication regimen of Atenolol 50mg, Amlodipine 5mg, and Lisinopril 20mg. Refilled Medications today. Labs ordered today.  Return to clinic in 3 months for reevaluation.  Call sooner if concerns arise.        Relevant Orders   Comp Met (CMET)      Respiratory   Chronic obstructive pulmonary disease (HCC) - Primary    Chronic.  Controlled.  Continue with current medication regimen.  Labs ordered today.  Return to clinic in 3 months for reevaluation.  Call sooner if concerns arise.          Endocrine   Hyperlipidemia associated with type 2 diabetes mellitus (HCC)    Chronic.  Controlled.  Continue with current medication regimen on Lovastatin 40mg.  Refills   sent today.  Labs ordered today.  Return to clinic in 3 months for reevaluation.  Call sooner if concerns arise.        Relevant Medications   TRULICITY 1.5 MG/0.5ML SOPN   Other Relevant Orders   Lipid Profile   DM (diabetes mellitus), type 2 with complications (HCC)    Chronic.  Controlled.  On Trulicity and doing well.   A1c has been well controlled at 5.5.   Labs ordered today. Will make recommendations based on lab results.  Return to clinic in 3 months for reevaluation.  Call sooner if concerns arise.        Relevant Medications   TRULICITY 1.5 MG/0.5ML SOPN   Other Relevant Orders   HgB A1c   Microalbumin, Urine Waived     Genitourinary   Chronic kidney disease (CKD), stage III (moderate) (HCC)    Chronic.  Labs ordered today.  Following up with Nephrology for ongoing management in kidney disease. Continue with weight loss and continue with recommendations per nephrology.  Follow up in 3 months.  Call sooner if concerns arise.         Other   Generalized anxiety disorder    Chronic. Well controlled.  Feels like the Latuda is working.   Continue with Wellbutrin and Duloxetine.  Refills sent today. Follow up in 3 months.  Call sooner if concerns arise.       Relevant Medications   buPROPion (WELLBUTRIN SR) 150 MG 12 hr tablet   DULoxetine (CYMBALTA) 30 MG capsule   DULoxetine (CYMBALTA) 60 MG capsule   traZODone (DESYREL) 100 MG tablet   Morbid obesity with body mass index of 40.0-49.9 (HCC)    Recommended eating smaller high protein, low fat meals more  frequently and exercising 30 mins a day 5 times a week with a goal of 10-15lb weight loss in the next 3 months.       Relevant Medications   TRULICITY 1.5 MG/0.5ML SOPN   Depression, recurrent (HCC)    Chronic. Well controlled.  Feels like the Latuda is working.   Continue with Wellbutrin and Duloxetine.  Refills sent today. Follow up in 3 months.  Call sooner if concerns arise.       Relevant Medications   buPROPion (WELLBUTRIN SR) 150 MG 12 hr tablet   DULoxetine (CYMBALTA) 30 MG capsule   DULoxetine (CYMBALTA) 60 MG capsule   traZODone (DESYREL) 100 MG tablet   Other Visit Diagnoses     Need for pneumococcal 20-valent conjugate vaccination       Relevant Orders   Pneumococcal conjugate vaccine 20-valent (Prevnar 20) (Completed)   Need for influenza vaccination       Relevant Orders   Flu Vaccine QUAD High Dose(Fluad) (Completed)   Encounter for screening mammogram for malignant neoplasm of breast       Relevant Orders   MM 3D SCREEN BREAST BILATERAL   Screening for osteoporosis       Relevant Orders   DG Bone Density        Follow up plan: Return in about 3 months (around 12/11/2022) for Welcome to medicare.       

## 2022-09-11 NOTE — Assessment & Plan Note (Signed)
Chronic.  Controlled.  Continue with current medication regimen on Lovastatin 45m.  Refills sent today.  Labs ordered today.  Return to clinic in 3 months for reevaluation.  Call sooner if concerns arise.

## 2022-09-11 NOTE — Assessment & Plan Note (Signed)
Recommended eating smaller high protein, low fat meals more frequently and exercising 30 mins a day 5 times a week with a goal of 10-15lb weight loss in the next 3 months.

## 2022-09-11 NOTE — Assessment & Plan Note (Signed)
Chronic. Well controlled.  Feels like the Anette Guarneri is working.   Continue with Wellbutrin and Duloxetine.  Refills sent today. Follow up in 3 months.  Call sooner if concerns arise.

## 2022-09-11 NOTE — Assessment & Plan Note (Signed)
Chronic.  Controlled.  Continue with current medication regimen of Atenolol 8m, Amlodipine 510m and Lisinopril 2077mRefilled Medications today. Labs ordered today.  Return to clinic in 3 months for reevaluation.  Call sooner if concerns arise.

## 2022-09-11 NOTE — Assessment & Plan Note (Signed)
Chronic.  Labs ordered today.  Following up with Nephrology for ongoing management in kidney disease. Continue with weight loss and continue with recommendations per nephrology.  Follow up in 3 months.  Call sooner if concerns arise.

## 2022-09-12 LAB — COMPREHENSIVE METABOLIC PANEL
ALT: 26 IU/L (ref 0–32)
AST: 23 IU/L (ref 0–40)
Albumin/Globulin Ratio: 2.2 (ref 1.2–2.2)
Albumin: 4 g/dL (ref 3.9–4.9)
Alkaline Phosphatase: 88 IU/L (ref 44–121)
BUN/Creatinine Ratio: 11 — ABNORMAL LOW (ref 12–28)
BUN: 18 mg/dL (ref 8–27)
Bilirubin Total: 0.3 mg/dL (ref 0.0–1.2)
CO2: 21 mmol/L (ref 20–29)
Calcium: 9 mg/dL (ref 8.7–10.3)
Chloride: 109 mmol/L — ABNORMAL HIGH (ref 96–106)
Creatinine, Ser: 1.64 mg/dL — ABNORMAL HIGH (ref 0.57–1.00)
Globulin, Total: 1.8 g/dL (ref 1.5–4.5)
Glucose: 80 mg/dL (ref 70–99)
Potassium: 4.5 mmol/L (ref 3.5–5.2)
Sodium: 143 mmol/L (ref 134–144)
Total Protein: 5.8 g/dL — ABNORMAL LOW (ref 6.0–8.5)
eGFR: 35 mL/min/{1.73_m2} — ABNORMAL LOW (ref >59)

## 2022-09-12 LAB — LIPID PANEL
Chol/HDL Ratio: 2.4 ratio (ref 0.0–4.4)
Cholesterol, Total: 146 mg/dL (ref 100–199)
HDL: 60 mg/dL (ref >39)
LDL Chol Calc (NIH): 70 mg/dL (ref 0–99)
Triglycerides: 82 mg/dL (ref 0–149)
VLDL Cholesterol Cal: 16 mg/dL (ref 5–40)

## 2022-09-12 LAB — HEMOGLOBIN A1C
Est. average glucose Bld gHb Est-mCnc: 108 mg/dL
Hgb A1c MFr Bld: 5.4 % (ref 4.8–5.6)

## 2022-09-12 NOTE — Progress Notes (Signed)
Hi Angela Mullen. It was nice to see you yesterday.  Your lab work looks good.  Your Kidney function remains stable.  Your A1c is well controlled in the normal range. No concerns at this time. Continue with your current medication regimen.  Follow up as discussed.  Please let me know if you have any questions.

## 2022-10-30 DIAGNOSIS — R809 Proteinuria, unspecified: Secondary | ICD-10-CM | POA: Diagnosis not present

## 2022-10-30 DIAGNOSIS — E1122 Type 2 diabetes mellitus with diabetic chronic kidney disease: Secondary | ICD-10-CM | POA: Diagnosis not present

## 2022-10-30 DIAGNOSIS — E663 Overweight: Secondary | ICD-10-CM | POA: Diagnosis not present

## 2022-10-30 DIAGNOSIS — M1000 Idiopathic gout, unspecified site: Secondary | ICD-10-CM | POA: Diagnosis not present

## 2022-11-01 DIAGNOSIS — I10 Essential (primary) hypertension: Secondary | ICD-10-CM | POA: Diagnosis not present

## 2022-11-01 DIAGNOSIS — E1122 Type 2 diabetes mellitus with diabetic chronic kidney disease: Secondary | ICD-10-CM | POA: Diagnosis not present

## 2022-11-01 DIAGNOSIS — G6189 Other inflammatory polyneuropathies: Secondary | ICD-10-CM | POA: Diagnosis not present

## 2022-11-01 DIAGNOSIS — E785 Hyperlipidemia, unspecified: Secondary | ICD-10-CM | POA: Diagnosis not present

## 2022-11-12 ENCOUNTER — Other Ambulatory Visit: Payer: Self-pay | Admitting: Nurse Practitioner

## 2022-11-13 ENCOUNTER — Encounter: Payer: Self-pay | Admitting: Nurse Practitioner

## 2022-11-14 ENCOUNTER — Telehealth: Payer: Self-pay

## 2022-11-14 MED ORDER — TRULICITY 0.75 MG/0.5ML ~~LOC~~ SOAJ: 0.7500 mg | SUBCUTANEOUS | 1 refills | Status: DC

## 2022-11-14 NOTE — Progress Notes (Signed)
[place] [facility]) [place]   11/14/2022  Angela Mullen Sep 07, 1956 [id number]  Reason for referral: Medication Assistance with Trulicity  Referral source:  [facility] Clinical Pharmacist Current insurance: [facility]  Reason for call: Medication Assistance  Outreach:  Unsuccessful telephone call attempt #1 to patient.   HIPAA compliant voicemail left requesting a return call  Plan:  -I will make another outreach attempt to patient within 3-4 business days.  Thank you for allowing [facility] pharmacy to be a part of this patient's care. Reed Breech, PharmD Clinical Pharmacist  [place] [phone]

## 2022-11-14 NOTE — Telephone Encounter (Signed)
Requested medication (s) are due for refill today: yes   Requested medication (s) are on the active medication list: yes   Last refill:  05/08/22 #30 g 0 refills  Future visit scheduled: yes in 3 weeks   Notes to clinic:  medication not assigned to a protocol. Do you want to refill Rx?     Requested Prescriptions  Pending Prescriptions Disp Refills   clotrimazole-betamethasone (LOTRISONE) cream [Pharmacy Med Name: Clotrimazole-Betamethasone 1-0.05 % External Cream] 30 g 0    Sig: APPLY  CREAM TOPICALLY TWICE DAILY AS NEEDED     Off-Protocol Failed - 11/12/2022  5:41 PM      Failed - Medication not assigned to a protocol, review manually.      Passed - Valid encounter within last 12 months    Recent Outpatient Visits           2 months ago Chronic obstructive pulmonary disease, unspecified COPD type ([place])   [place], NP   5 months ago Essential (primary) hypertension   [place] Jon Billings, NP   7 months ago Essential (primary) hypertension   [place] Jon Billings, NP   10 months ago Cold sore   [place] Vigg, [place], MD   10 months ago Depression, recurrent [facility])   [place] Jon Billings, NP       Future Appointments             In 3 weeks Jon Billings, NP [place], [facility]

## 2022-11-14 NOTE — Telephone Encounter (Signed)
Requested medication (s) are due for refill today: historical medication   Requested medication (s) are on the active medication list: yes   Last refill:  06/08/22  Future visit scheduled: yes in 3 weeks   Notes to clinic:  historical medication . Do you want to order Rx?     Requested Prescriptions  Pending Prescriptions Disp Refills   TRULICITY 1.5 0000000 SOPN [Pharmacy Med Name: Trulicity 1.5 0000000 Subcutaneous Solution Pen-injector] 4 mL 0    Sig: INJECT 1.5MG INTO THE SKIN ONCE A WEEK     Endocrinology:  Diabetes - GLP-1 Receptor Agonists Passed - 11/12/2022  5:41 PM      Passed - HBA1C is between 0 and 7.9 and within 180 days    Hemoglobin A1C  Date Value Ref Range Status  01/15/2018 5.8  Final   Hgb A1c MFr Bld  Date Value Ref Range Status  09/11/2022 5.4 4.8 - 5.6 % Final    Comment:             Prediabetes: 5.7 - 6.4          Diabetes: >6.4          Glycemic control for adults with diabetes: <7.0          Passed - Valid encounter within last 6 months    Recent Outpatient Visits           2 months ago Chronic obstructive pulmonary disease, unspecified COPD type ([place])   [place], NP   5 months ago Essential (primary) hypertension   [place] Jon Billings, NP   7 months ago Essential (primary) hypertension   [place] Jon Billings, NP   10 months ago Cold sore   [place] Vigg, Avanti, MD   10 months ago Depression, recurrent [facility])   [place] Jon Billings, NP       Future Appointments             In 3 weeks Jon Billings, NP [place], [facility]

## 2022-11-20 ENCOUNTER — Telehealth: Payer: Self-pay | Admitting: Nurse Practitioner

## 2022-11-20 NOTE — Telephone Encounter (Signed)
Contacted [name] to schedule their annual wellness visit. Welcome to Medicare visit Due by 01/24/2023.  Sherol Dade; Care Guide Ambulatory Clinical [facility] [place] [facility] Direct Dial: [phone]

## 2022-11-21 ENCOUNTER — Telehealth: Payer: Self-pay

## 2022-11-21 NOTE — Progress Notes (Signed)
Angela Mullen   11/21/2022  Angela Mullen 10-04-1955 [id number]  Reason for referral: Medication Assistance with Trulicity  Referral source:  [facility] Clinical Pharmacist Current insurance: [facility]  Reason for call: Medication Assistance   Outreach:  Unsuccessful telephone call attempt #2 to patient.   HIPAA compliant voicemail left requesting a return call  Plan:  -I will make another outreach attempt to patient within 3-4 business days.  Thank you for allowing [facility] pharmacy to be a part of this patient's care.   Reed Breech, PharmD Clinical Pharmacist  [place] [phone]

## 2022-11-30 ENCOUNTER — Telehealth: Payer: Self-pay

## 2022-11-30 NOTE — Progress Notes (Signed)
[place] [facility]) [place]   11/30/2022  Nijha Korpela [birth year] [id number]  Reason for referral: Medication Assistance with Trulicity  Referral source:  [facility] Clinical Pharmacist Current insurance: [facility]  Reason for call: Medication Assistance   Outreach:  Unsuccessful telephone call attempt #3 to patient.   HIPAA compliant voicemail left requesting a return call  Plan:  -I will close [place] case at this time as I have been unable to establish and/or maintain contact with patient.  Thank you for allowing [facility] pharmacy to be a part of this patient's care.   Reed Breech, PharmD Clinical Pharmacist  [place] [phone]

## 2022-12-05 DIAGNOSIS — G4733 Obstructive sleep apnea (adult) (pediatric): Secondary | ICD-10-CM | POA: Diagnosis not present

## 2022-12-06 ENCOUNTER — Encounter: Payer: Self-pay | Admitting: Nurse Practitioner

## 2022-12-11 ENCOUNTER — Ambulatory Visit (INDEPENDENT_AMBULATORY_CARE_PROVIDER_SITE_OTHER): Payer: Medicare Other | Admitting: Nurse Practitioner

## 2022-12-11 ENCOUNTER — Encounter: Payer: Self-pay | Admitting: Nurse Practitioner

## 2022-12-11 VITALS — BP 137/75 | HR 58 | Temp 98.0°F | Ht 65.4 in | Wt 238.6 lb

## 2022-12-11 DIAGNOSIS — E1169 Type 2 diabetes mellitus with other specified complication: Secondary | ICD-10-CM

## 2022-12-11 DIAGNOSIS — J449 Chronic obstructive pulmonary disease, unspecified: Secondary | ICD-10-CM | POA: Diagnosis not present

## 2022-12-11 DIAGNOSIS — Z7189 Other specified counseling: Secondary | ICD-10-CM

## 2022-12-11 DIAGNOSIS — E118 Type 2 diabetes mellitus with unspecified complications: Secondary | ICD-10-CM

## 2022-12-11 DIAGNOSIS — N1832 Chronic kidney disease, stage 3b: Secondary | ICD-10-CM

## 2022-12-11 DIAGNOSIS — Z7985 Long-term (current) use of injectable non-insulin antidiabetic drugs: Secondary | ICD-10-CM

## 2022-12-11 DIAGNOSIS — F339 Major depressive disorder, recurrent, unspecified: Secondary | ICD-10-CM

## 2022-12-11 DIAGNOSIS — D692 Other nonthrombocytopenic purpura: Secondary | ICD-10-CM

## 2022-12-11 DIAGNOSIS — E785 Hyperlipidemia, unspecified: Secondary | ICD-10-CM

## 2022-12-11 DIAGNOSIS — Z0000 Encounter for general adult medical examination without abnormal findings: Secondary | ICD-10-CM

## 2022-12-11 DIAGNOSIS — I10 Essential (primary) hypertension: Secondary | ICD-10-CM

## 2022-12-11 DIAGNOSIS — Z6839 Body mass index (BMI) 39.0-39.9, adult: Secondary | ICD-10-CM

## 2022-12-11 DIAGNOSIS — G629 Polyneuropathy, unspecified: Secondary | ICD-10-CM

## 2022-12-11 LAB — MICROSCOPIC EXAMINATION
Bacteria, UA: NONE SEEN
RBC, Urine: NONE SEEN /hpf (ref 0–2)

## 2022-12-11 LAB — URINALYSIS, ROUTINE W REFLEX MICROSCOPIC
Bilirubin, UA: NEGATIVE
Glucose, UA: NEGATIVE
Ketones, UA: NEGATIVE
Nitrite, UA: NEGATIVE
Protein,UA: NEGATIVE
RBC, UA: NEGATIVE
Specific Gravity, UA: 1.015 (ref 1.005–1.030)
Urobilinogen, Ur: 0.2 mg/dL (ref 0.2–1.0)
pH, UA: 5 (ref 5.0–7.5)

## 2022-12-11 MED ORDER — DULOXETINE HCL 60 MG PO CPEP: 60.0000 mg | ORAL_CAPSULE | Freq: Every day | ORAL | 1 refills | Status: DC

## 2022-12-11 MED ORDER — DULOXETINE HCL 30 MG PO CPEP: 30.0000 mg | ORAL_CAPSULE | Freq: Every day | ORAL | 1 refills | Status: DC

## 2022-12-11 MED ORDER — TIRZEPATIDE 2.5 MG/0.5ML ~~LOC~~ SOAJ: 2.5000 mg | SUBCUTANEOUS | 0 refills | Status: DC

## 2022-12-11 NOTE — Progress Notes (Signed)
BP 137/75 (BP Location: Right Arm, Cuff Size: Normal)   Pulse (!) 58   Temp 98 F (36.7 C) (Oral)   Ht 5' 5.4" (1.661 m)   Wt 238 lb 9.6 oz (108.2 kg)   SpO2 99%   BMI 39.22 kg/m    Subjective:    Patient ID: Angela Mullen, female    DOB: 1956-02-05, 67 y.o.   MRN: [id number]  HPI: Angela Mullen is a 67 y.o. female presenting on 12/11/2022 for comprehensive medical examination. Current medical complaints include: bags under eyes  She currently lives with: Menopausal Symptoms: no  ANXIETY/DEPRESSION Patient states she feels like the [place] has made her gain weight and has exacerbated her binge eating.  She has gained about 13lbs since her last visit.   She has been table to get the medication now that she is on Medicare.  Denies SI.    [facility] Visit from 09/11/2022 in [place]  PHQ-9 Total Score 11             09/11/2022    9:34 AM 03/22/2022    9:12 AM 01/18/2022    2:41 PM 12/20/2021    8:27 AM  GAD 7 : Generalized Anxiety Score  Nervous, Anxious, on Edge 1 1 0 1  Control/stop worrying 1 1 0 0  Worry too much - different things 1 1 0 1  Trouble relaxing 1 1 0 0  Restless 1 1 0 1  Easily annoyed or irritable 1 2 0 0  Afraid - awful might happen 1 0 0 0  Total GAD 7 Score 7 7 0 3  Anxiety Difficulty Somewhat difficult Somewhat difficult Not difficult at all Somewhat difficult      CHRONIC KIDNEY DISEASE Patient has been seeing the nephrologist for CKD. They discussed weight loss options to help control diabetes and CKD. Patient has changed her diet and lost some weight. She has a follow up coming up with Dr. Holley Raring CKD status: controlled Medications renally dose: yes Previous renal evaluation: yes Pneumovax:  Up to Date Influenza Vaccine:  Up to Date     HYPERTENSION Hypertension status: controlled  Satisfied with current treatment? no Duration of hypertension: years BP monitoring frequency:  not checking BP range:  BP  medication side effects:  no Medication compliance: excellent compliance Previous BP meds: atenolol, amlodipine, and lisinopril Aspirin: no Recurrent headaches: no Visual changes: no Palpitations: no Dyspnea: no Chest pain: no Lower extremity edema: no Dizzy/lightheaded: no   DIABETES Patient states she is trouble getting the Trulicity.  Wondering if she can be changed to [facility] or Ozempic.  Hypoglycemic episodes:no Polydipsia/polyuria: no Visual disturbance: no Chest pain: no Paresthesias: no Glucose Monitoring: no             Accucheck frequency: Not Checking             Fasting glucose:             Post prandial:             Evening:             Before meals: Taking Insulin?: no             Long acting insulin:             Short acting insulin: Blood Pressure Monitoring: not checking Retinal Examination: Up to date Foot Exam: Up to Date Diabetic Education: Not Completed Pneumovax: Up to Date Influenza: Up to Date Aspirin: no  COPD COPD status: controlled Satisfied with current treatment?: yes Oxygen use: no Dyspnea frequency: some Cough frequency: no Rescue inhaler frequency:  rarely Limitation of activity: no Productive cough:  Last Spirometry:  Pneumovax: Up to Date Influenza: Up to Date   Functional Status Survey:       12/11/2022    9:12 AM 09/11/2022    9:29 AM 03/22/2022    9:12 AM 01/18/2022    2:40 PM 12/20/2021    8:27 AM  Fall Risk   Falls in the past year? 0 0 0 0 0  Number falls in past yr: 0 0 0 0 0  Injury with Fall? 0 0 0 0 0  Risk for fall due to : No Fall Risks No Fall Risks No Fall Risks No Fall Risks No Fall Risks  Follow up Falls evaluation completed Falls evaluation completed Falls evaluation completed Falls evaluation completed Falls evaluation completed    Depression Screen    12/11/2022    9:12 AM 09/11/2022    9:34 AM 03/22/2022    9:12 AM 01/18/2022    2:40 PM 12/20/2021    8:27 AM  Depression screen PHQ 2/9   Decreased Interest 1 1 2  0 1  Down, Depressed, Hopeless 1 1 2  0 1  PHQ - 2 Score 2 2 4  0 2  Altered sleeping 1 1 2  0 1  Tired, decreased energy 1 2 3  0 1  Change in appetite 2 2 2  0 2  Feeling bad or failure about yourself  1 1 2  0 1  Trouble concentrating 2 1 2  0 0  Moving slowly or fidgety/restless 1 1 2  0 1  Suicidal thoughts 0 1 0 0 0  PHQ-9 Score 10 11 17  0 8  Difficult doing work/chores Somewhat difficult Somewhat difficult Not difficult at all Not difficult at all Somewhat difficult     Advanced Directives Does patient have a HCPOA?    no If yes, name and contact information:  Does patient have a living will or MOST form?  no  Past Medical History:  Past Medical History:  Diagnosis Date   Abnormal liver enzymes 09/03/2014   Acid reflux    Allergy    Anemia    H/O   Anxiety    Arthritis    Cataract    COPD (chronic obstructive pulmonary disease) ([place])    Depression    Diabetes mellitus without complication ([place])    Dyspnea    WITH EXERTION DUE TO COPD   Elevation of level of transaminase or lactic acid dehydrogenase (LDH) 08/27/2012   Fatty liver    Headache    H/O MIGRAINES   High blood pressure    High cholesterol    Sleep apnea    BIPAP    Surgical History:  Past Surgical History:  Procedure Laterality Date   BARIATRIC SURGERY  06/15/2017   CARDIAC CATHETERIZATION     COLONOSCOPY WITH PROPOFOL N/A 10/10/2018   Procedure: COLONOSCOPY WITH PROPOFOL;  Surgeon: Jonathon Bellows, MD;  [facility] ENDOSCOPY;  Service: Gastroenterology;  Laterality: N/A;   FRACTURE SURGERY  1990   RIGHT LEG    KNEE SURGERY     REPLACEMENT TOTAL KNEE Left    TONSILLECTOMY     TOTAL ABDOMINAL HYSTERECTOMY W/ BILATERAL SALPINGOOPHORECTOMY     TOTAL KNEE ARTHROPLASTY Left 04/28/2020   Procedure: TOTAL KNEE ARTHROPLASTY;  Surgeon: Lovell Sheehan, MD;  Location: [facility] ORS;  Service: Orthopedics;  Laterality: Left;   TUBAL LIGATION  Medications:  Current Outpatient  Medications on File Prior to Visit  Medication Sig   acetaminophen (TYLENOL) 500 MG tablet Take 1,000 mg by mouth every 6 (six) hours as needed for moderate pain.    albuterol (VENTOLIN HFA) 108 (90 Base) MCG/ACT inhaler Inhale 1-2 puffs into the lungs every 6 (six) hours as needed for wheezing or shortness of breath.   amLODipine (NORVASC) 5 MG tablet Take 1 tablet by mouth once daily   atenolol (TENORMIN) 50 MG tablet Take 1 tablet (50mg ) in the morning and 2 tablets (100mg ) in the evening.   buPROPion (WELLBUTRIN SR) 150 MG 12 hr tablet Take 1 tablet (150 mg total) by mouth 2 (two) times daily.   clotrimazole-betamethasone (LOTRISONE) cream APPLY  CREAM TOPICALLY TWICE DAILY AS NEEDED   colchicine 0.6 MG tablet Take 1 tablet (0.6 mg total) by mouth daily. Take at the onset of symptoms for 5 days   Dulaglutide (TRULICITY) A999333 0000000 SOPN Inject 0.75 mg into the skin once a week.   lisinopril (ZESTRIL) 20 MG tablet Take 1 tablet (20 mg total) by mouth daily.   lovastatin (MEVACOR) 40 MG tablet Take 1 tablet (40 mg total) by mouth at bedtime.   lurasidone (LATUDA) 20 MG TABS tablet Take 1 tablet (20 mg total) by mouth daily.   methocarbamol (ROBAXIN) 750 MG tablet TAKE 1 TABLET (750 MG TOTAL) BY MOUTH EVERY 8 (EIGHT) HOURS AS NEEDED.   pregabalin (LYRICA) 150 MG capsule Take 2 capsules (300 mg total) by mouth daily.   traZODone (DESYREL) 100 MG tablet Take 2 tablets (200 mg total) by mouth at bedtime as needed for sleep.   No current facility-administered medications on file prior to visit.    Allergies:  Allergies  Allergen Reactions   Ketorolac Other (See Comments)    Altered Mental Status-PT DENIES THIS ALLERGY   Prednisone Other (See Comments)    "wheezing"   Nsaids     DUE TO HAVING GASTRIC BYPASS SURGERY    Social History:  Social History   Socioeconomic History   Marital status: Married    Spouse name: Not on file   Number of children: Not on file   Years of education:  Not on file   Highest education level: Not on file  Occupational History   Not on file  Tobacco Use   Smoking status: Never   Smokeless tobacco: Never  Vaping Use   Vaping Use: Never used  Substance and Sexual Activity   Alcohol use: Yes    Comment: occassional   Drug use: No   Sexual activity: Not Currently  Other Topics Concern   Not on file  Social History Narrative   Not on file   Social Determinants of Health   Financial Resource Strain: Not on file  Food Insecurity: Not on file  Transportation Needs: Not on file  Physical Activity: Not on file  Stress: Not on file  Social Connections: Not on file  Intimate Partner Violence: Not on file   Social History   Tobacco Use  Smoking Status Never  Smokeless Tobacco Never   Social History   Substance and Sexual Activity  Alcohol Use Yes   Comment: occassional    Family History:  Family History  Problem Relation Age of Onset   Cerebral aneurysm Mother    Stroke Father    Diabetes Father    Depression Sister    Obesity Sister    Kidney disease Sister    Heart attack Sister  Stroke Sister    Hypertension Sister    Diabetes Brother    High blood pressure Brother    Heart attack Maternal Grandfather    Hyperlipidemia Son    Stroke Maternal Grandmother    Diabetes Paternal Grandfather    Hyperlipidemia Paternal Grandfather    Heart disease Paternal Grandfather     Past medical history, surgical history, medications, allergies, family history and social history reviewed with patient today and changes made to appropriate areas of the chart.   Review of Systems  HENT:         Denies vision changes.  Eyes:  Negative for blurred vision and double vision.  Respiratory:  Negative for shortness of breath.   Cardiovascular:  Negative for chest pain, palpitations and leg swelling.  Neurological:  Negative for dizziness, tingling and headaches.  Endo/Heme/Allergies:  Negative for polydipsia.       Denies  Polyuria  Psychiatric/Behavioral:  Positive for depression. Negative for suicidal ideas. The patient is nervous/anxious.     All other ROS negative except what is listed above and in the HPI.      Objective:    BP 137/75 (BP Location: Right Arm, Cuff Size: Normal)   Pulse (!) 58   Temp 98 F (36.7 C) (Oral)   Ht 5' 5.4" (1.661 m)   Wt 238 lb 9.6 oz (108.2 kg)   SpO2 99%   BMI 39.22 kg/m   Wt Readings from Last 3 Encounters:  12/11/22 238 lb 9.6 oz (108.2 kg)  09/11/22 225 lb 14.4 oz (102.5 kg)  06/12/22 224 lb 4.8 oz (101.7 kg)    Hearing Screening   500Hz  1000Hz  2000Hz  4000Hz   Right ear 25 25 25 25   Left ear 20 25 25 25    Vision Screening   Right eye Left eye Both eyes  Without correction     With correction 20/50 20/25 20/25     Physical Exam Vitals and nursing note reviewed.  Constitutional:      General: She is awake. She is not in acute distress.    Appearance: Normal appearance. She is well-developed. She is obese. She is not ill-appearing.  HENT:     Head: Normocephalic and atraumatic.     Right Ear: Hearing, tympanic membrane, ear canal and external ear normal. No drainage.     Left Ear: Hearing, tympanic membrane, ear canal and external ear normal. No drainage.     Nose: Nose normal.     Right Sinus: No maxillary sinus tenderness or frontal sinus tenderness.     Left Sinus: No maxillary sinus tenderness or frontal sinus tenderness.     Mouth/Throat:     Mouth: Mucous membranes are moist.     Pharynx: Oropharynx is clear. Uvula midline. No pharyngeal swelling, oropharyngeal exudate or posterior oropharyngeal erythema.  Eyes:     General: Lids are normal.        Right eye: No discharge.        Left eye: No discharge.     Extraocular Movements: Extraocular movements intact.     Conjunctiva/sclera: Conjunctivae normal.     Pupils: Pupils are equal, round, and reactive to light.     Visual Fields: Right eye visual fields normal and left eye visual fields  normal.  Neck:     Thyroid: No thyromegaly.     Vascular: No carotid bruit.     Trachea: Trachea normal.  Cardiovascular:     Rate and Rhythm: Normal rate and regular rhythm.  Heart sounds: Normal heart sounds. No murmur heard.    No gallop.  Pulmonary:     Effort: Pulmonary effort is normal. No accessory muscle usage or respiratory distress.     Breath sounds: Normal breath sounds.  Chest:  Breasts:    Right: Normal.     Left: Normal.  Abdominal:     General: Bowel sounds are normal.     Palpations: Abdomen is soft. There is no hepatomegaly or splenomegaly.     Tenderness: There is no abdominal tenderness.  Musculoskeletal:        General: Normal range of motion.     Cervical back: Normal range of motion and neck supple.     Right lower leg: No edema.     Left lower leg: No edema.  Lymphadenopathy:     Head:     Right side of head: No submental, submandibular, tonsillar, preauricular or posterior auricular adenopathy.     Left side of head: No submental, submandibular, tonsillar, preauricular or posterior auricular adenopathy.     Cervical: No cervical adenopathy.     Upper Body:     Right upper body: No supraclavicular, axillary or pectoral adenopathy.     Left upper body: No supraclavicular, axillary or pectoral adenopathy.  Skin:    General: Skin is warm and dry.     Capillary Refill: Capillary refill takes less than 2 seconds.     Findings: No rash.  Neurological:     Mental Status: She is alert and oriented to person, place, and time.     Gait: Gait is intact.  Psychiatric:        Attention and Perception: Attention normal.        Mood and Affect: Mood normal.        Speech: Speech normal.        Behavior: Behavior normal. Behavior is cooperative.        Thought Content: Thought content normal.        Judgment: Judgment normal.         No data to display          Cognitive Testing - 6-CIT  Correct? Score   What year is it? yes 0 Yes = 0    No = 4   What month is it? yes 0 Yes = 0    No = 3  Remember:     Pia [address]au, [place], [place]     What time is it? yes 0 Yes = 0    No = 3  Count backwards from 20 to 1 yes 0 Correct = 0    1 error = 2   More than 1 error = 4  Say the months of the year in reverse. yes 0 Correct = 0    1 error = 2   More than 1 error = 4  What address did I ask you to remember? yes 0 Correct = 0  1 error = 2    2 error = 4    3 error = 6    4 error = 8    All wrong = 10       TOTAL SCORE  0/28   Interpretation:  Normal  Normal (0-7) Abnormal (8-28)   Results for orders placed or performed in visit on 09/11/22  Comp Met (CMET)  Result Value Ref Range   Glucose 80 70 - 99 mg/dL   BUN 18 8 - 27 mg/dL  Creatinine, Ser 1.64 (H) 0.57 - 1.00 mg/dL   eGFR 35 (L) >59 mL/min/1.73   BUN/Creatinine Ratio 11 (L) 12 - 28   Sodium 143 134 - 144 mmol/L   Potassium 4.5 3.5 - 5.2 mmol/L   Chloride 109 (H) 96 - 106 mmol/L   CO2 21 20 - 29 mmol/L   Calcium 9.0 8.7 - 10.3 mg/dL   Total Protein 5.8 (L) 6.0 - 8.5 g/dL   Albumin 4.0 3.9 - 4.9 g/dL   Globulin, Total 1.8 1.5 - 4.5 g/dL   Albumin/Globulin Ratio 2.2 1.2 - 2.2   Bilirubin Total 0.3 0.0 - 1.2 mg/dL   Alkaline Phosphatase 88 44 - 121 IU/L   AST 23 0 - 40 IU/L   ALT 26 0 - 32 IU/L  Lipid Profile  Result Value Ref Range   Cholesterol, Total 146 100 - 199 mg/dL   Triglycerides 82 0 - 149 mg/dL   HDL 60 >39 mg/dL   VLDL Cholesterol Cal 16 5 - 40 mg/dL   LDL Chol Calc (NIH) 70 0 - 99 mg/dL   Chol/HDL Ratio 2.4 0.0 - 4.4 ratio  HgB A1c  Result Value Ref Range   Hgb A1c MFr Bld 5.4 4.8 - 5.6 %   Est. average glucose Bld gHb Est-mCnc 108 mg/dL  Microalbumin, Urine Waived  Result Value Ref Range   Microalb, Ur Waived 80 (H) 0 - 19 mg/L   Creatinine, Urine Waived 100 10 - 300 mg/dL   Microalb/Creat Ratio 30-300 (H) <30 mg/g      Assessment & Plan:   Problem List Items Addressed This Visit       Cardiovascular and Mediastinum   Essential (primary)  hypertension    Chronic.  Controlled.  Continue with current medication regimen of Atenolol 50mg , Amlodipine 5mg , and Lisinopril 20mg . Not due for refills today.  Labs ordered today.  Return to clinic in 3 months for reevaluation.  Call sooner if concerns arise.        Senile purpura (HCC)    Chronic.  Reassured patient during visit today.         Respiratory   Chronic obstructive pulmonary disease (HCC)    Chronic.  Controlled.  Continue with current medication regimen.  Labs ordered today.  Return to clinic in 3 months for reevaluation.  Call sooner if concerns arise.          Endocrine   Hyperlipidemia associated with type 2 diabetes mellitus (HCC)    Chronic.  Controlled.  Continue with current medication regimen on Lovastatin 40mg .  Not due for refills today. Labs ordered today.  Return to clinic in 3 months for reevaluation.  Call sooner if concerns arise.        Relevant Medications   tirzepatide (MOUNJARO) 2.5 MG/0.5ML Pen   Other Relevant Orders   Lipid panel   DM (diabetes mellitus), type 2 with complications (HCC)    Chronic.  Controlled.  On Trulicity but not able to get medication.  Will change to [facility].  Discussed side effects and benefits of medication during visit.  A1c has been well controlled at 5.3 in February at Nephrology office.   Labs ordered today. Will make recommendations based on lab results.  Follow up in 6 weeks if able to get Mounjaro.  If not, return to clinic in 3 months for reevaluation.  Call sooner if concerns arise.        Relevant Medications   tirzepatide (MOUNJARO) 2.5 MG/0.5ML Pen   Other  Relevant Orders   HgB A1c   Amb Referral to Nutrition and Diabetic Education     Nervous and Auditory   Polyneuropathy    Stable, continue Lyrica and Cymbalta.       Relevant Medications   DULoxetine (CYMBALTA) 30 MG capsule   DULoxetine (CYMBALTA) 60 MG capsule     Genitourinary   Chronic kidney disease (CKD), stage III (moderate) (HCC)     Chronic.  Labs ordered today.  Following up with Nephrology for ongoing management in kidney disease. Controlled at this time.  Follow up in 3 months.  Call sooner if concerns arise.         Other   Morbid obesity with body mass index of 40.0-49.9 (HCC)    Recommended eating smaller high protein, low fat meals more frequently and exercising 30 mins a day 5 times a week with a goal of 10-15lb weight loss in the next 3 months.       Relevant Medications   tirzepatide (MOUNJARO) 2.5 MG/0.5ML Pen   Depression, recurrent (HCC)   Relevant Medications   DULoxetine (CYMBALTA) 30 MG capsule   DULoxetine (CYMBALTA) 60 MG capsule   Other Relevant Orders   Ambulatory referral to Psychiatry   Advanced care planning/counseling discussion    A voluntary discussion about advance care planning including the explanation and discussion of advance directives was extensively discussed  with the patient for 5 minutes with patient.  Explanation about the health care proxy and Living will was reviewed and packet with forms with explanation of how to fill them out was given.  During this discussion, the patient was able to identify a health care proxy as one of her children and plans to fill out the paperwork required.  Patient was offered a separate [place] visit for further assistance with forms.         Other Visit Diagnoses     Welcome to Medicare preventive visit    -  Primary   EKG showed NSR.   Relevant Orders   EKG 12-Lead (Completed)   Annual physical exam       Health maintenance reviewed during visit today.  Labs ordered.  Vaccines up to date. Colon screening up to date.   Mammogram and Dexa have been ordered.   Relevant Orders   CBC with Differential/Platelet   Comprehensive metabolic panel   Lipid panel   TSH   Urinalysis, Routine w reflex microscopic        Preventative Services:  AAA screening: NA Health Risk Assessment and Personalized Prevention Plan: Up to date Bone  Mass Measurements: Scheduled May 1 Breast Cancer Screening:Scheduled May 1 CVD Screening: Up to date Cervical Cancer Screening: NA Colon Cancer Screening: Up to date Depression Screening: Up to date Diabetes Screening: Up to date Glaucoma Screening: Up to date Hepatitis B vaccine: NA Hepatitis C screening: Up to date HIV Screening: UP to date Flu Vaccine: Up to date Lung cancer Screening: NA Obesity Screening: Up to date Pneumonia Vaccines (2): Up to date STI Screening:NA  Follow up plan: Return in about 6 weeks (around 01/22/2023) for Medication Management.   LABORATORY TESTING:  - Pap smear: not applicable  IMMUNIZATIONS:   - Tdap: Tetanus vaccination status reviewed: last tetanus booster within 10 years. - Influenza: Up to date - Pneumovax: Up to date - Prevnar: Up to date - Zostavax vaccine:  Discussed at visit  SCREENING: -Mammogram: Ordered today  - Colonoscopy: Up to date  - Bone Density: Ordered  today  -Hearing Test: Not applicable  -Spirometry: Not applicable   PATIENT COUNSELING:   Advised to take 1 mg of folate supplement per day if capable of pregnancy.   Sexuality: Discussed sexually transmitted diseases, partner selection, use of condoms, avoidance of unintended pregnancy  and contraceptive alternatives.   Advised to avoid cigarette smoking.  I discussed with the patient that most people either abstain from alcohol or drink within safe limits (<=14/week and <=4 drinks/occasion for males, <=7/weeks and <= 3 drinks/occasion for females) and that the risk for alcohol disorders and other health effects rises proportionally with the number of drinks per week and how often a drinker exceeds daily limits.  Discussed cessation/primary prevention of drug use and availability of treatment for abuse.   Diet: Encouraged to adjust caloric intake to maintain  or achieve ideal body weight, to reduce intake of dietary saturated fat and total fat, to limit sodium intake  by avoiding high sodium foods and not adding table salt, and to maintain adequate dietary potassium and calcium preferably from fresh fruits, vegetables, and low-fat dairy products.    stressed the importance of regular exercise  Injury prevention: Discussed safety belts, safety helmets, smoke detector, smoking near bedding or upholstery.   Dental health: Discussed importance of regular tooth brushing, flossing, and dental visits.    NEXT PREVENTATIVE PHYSICAL DUE IN 1 YEAR. Return in about 6 weeks (around 01/22/2023) for Medication Management.

## 2022-12-11 NOTE — Assessment & Plan Note (Signed)
Chronic.  Controlled.  Continue with current medication regimen on Lovastatin 40mg .  Not due for refills today. Labs ordered today.  Return to clinic in 3 months for reevaluation.  Call sooner if concerns arise.

## 2022-12-11 NOTE — Assessment & Plan Note (Signed)
Stable, continue Lyrica and Cymbalta.

## 2022-12-11 NOTE — Assessment & Plan Note (Signed)
Chronic.  Controlled.  Continue with current medication regimen.  Labs ordered today.  Return to clinic in 3 months for reevaluation.  Call sooner if concerns arise.   

## 2022-12-11 NOTE — Assessment & Plan Note (Signed)
Chronic.  Controlled.  Continue with current medication regimen of Atenolol 50mg , Amlodipine 5mg , and Lisinopril 20mg . Not due for refills today.  Labs ordered today.  Return to clinic in 3 months for reevaluation.  Call sooner if concerns arise.

## 2022-12-11 NOTE — Assessment & Plan Note (Signed)
Chronic.  Reassured patient during visit today.

## 2022-12-11 NOTE — Assessment & Plan Note (Addendum)
Chronic.  Controlled.  On Trulicity but not able to get medication.  Will change to [facility].  Discussed side effects and benefits of medication during visit.  A1c has been well controlled at 5.3 in February at Nephrology office.   Labs ordered today. Will make recommendations based on lab results.  Follow up in 6 weeks if able to get Mounjaro.  If not, return to clinic in 3 months for reevaluation.  Call sooner if concerns arise.

## 2022-12-11 NOTE — Assessment & Plan Note (Signed)
Recommended eating smaller high protein, low fat meals more frequently and exercising 30 mins a day 5 times a week with a goal of 10-15lb weight loss in the next 3 months.  

## 2022-12-11 NOTE — Assessment & Plan Note (Signed)
A voluntary discussion about advance care planning including the explanation and discussion of advance directives was extensively discussed  with the patient for 5 minutes with patient.  Explanation about the health care proxy and Living will was reviewed and packet with forms with explanation of how to fill them out was given.  During this discussion, the patient was able to identify a health care proxy as one of her children and plans to fill out the paperwork required.  Patient was offered a separate [place] visit for further assistance with forms.

## 2022-12-11 NOTE — Progress Notes (Signed)
Results discussed with patient during visit.

## 2022-12-11 NOTE — Assessment & Plan Note (Signed)
Chronic.  Labs ordered today.  Following up with Nephrology for ongoing management in kidney disease. Controlled at this time.  Follow up in 3 months.  Call sooner if concerns arise.

## 2022-12-12 ENCOUNTER — Telehealth: Payer: Self-pay | Admitting: Nurse Practitioner

## 2022-12-12 LAB — CBC WITH DIFFERENTIAL/PLATELET
Basophils Absolute: 0.1 10*3/uL (ref 0.0–0.2)
Basos: 1 %
EOS (ABSOLUTE): 0.3 10*3/uL (ref 0.0–0.4)
Eos: 6 %
Hematocrit: 36.1 % (ref 34.0–46.6)
Hemoglobin: 11.7 g/dL (ref 11.1–15.9)
Immature Grans (Abs): 0 10*3/uL (ref 0.0–0.1)
Immature Granulocytes: 0 %
Lymphocytes Absolute: 0.8 10*3/uL (ref 0.7–3.1)
Lymphs: 14 %
MCH: 30.9 pg (ref 26.6–33.0)
MCHC: 32.4 g/dL (ref 31.5–35.7)
MCV: 95 fL (ref 79–97)
Monocytes Absolute: 0.4 10*3/uL (ref 0.1–0.9)
Monocytes: 7 %
Neutrophils Absolute: 4.3 10*3/uL (ref 1.4–7.0)
Neutrophils: 72 %
Platelets: 141 10*3/uL — ABNORMAL LOW (ref 150–450)
RBC: 3.79 x10E6/uL (ref 3.77–5.28)
RDW: 12.2 % (ref 11.7–15.4)
WBC: 5.9 10*3/uL (ref 3.4–10.8)

## 2022-12-12 LAB — LIPID PANEL
Chol/HDL Ratio: 1.9 ratio (ref 0.0–4.4)
Cholesterol, Total: 148 mg/dL (ref 100–199)
HDL: 79 mg/dL (ref >39)
LDL Chol Calc (NIH): 57 mg/dL (ref 0–99)
Triglycerides: 60 mg/dL (ref 0–149)
VLDL Cholesterol Cal: 12 mg/dL (ref 5–40)

## 2022-12-12 LAB — COMPREHENSIVE METABOLIC PANEL
ALT: 27 IU/L (ref 0–32)
AST: 28 IU/L (ref 0–40)
Albumin/Globulin Ratio: 2.2 (ref 1.2–2.2)
Albumin: 4.2 g/dL (ref 3.9–4.9)
Alkaline Phosphatase: 98 IU/L (ref 44–121)
BUN/Creatinine Ratio: 10 — ABNORMAL LOW (ref 12–28)
BUN: 18 mg/dL (ref 8–27)
Bilirubin Total: 0.4 mg/dL (ref 0.0–1.2)
CO2: 23 mmol/L (ref 20–29)
Calcium: 9.1 mg/dL (ref 8.7–10.3)
Chloride: 105 mmol/L (ref 96–106)
Creatinine, Ser: 1.75 mg/dL — ABNORMAL HIGH (ref 0.57–1.00)
Globulin, Total: 1.9 g/dL (ref 1.5–4.5)
Glucose: 84 mg/dL (ref 70–99)
Potassium: 4.4 mmol/L (ref 3.5–5.2)
Sodium: 141 mmol/L (ref 134–144)
Total Protein: 6.1 g/dL (ref 6.0–8.5)
eGFR: 32 mL/min/{1.73_m2} — ABNORMAL LOW (ref >59)

## 2022-12-12 LAB — HEMOGLOBIN A1C
Est. average glucose Bld gHb Est-mCnc: 91 mg/dL
Hgb A1c MFr Bld: 4.8 % (ref 4.8–5.6)

## 2022-12-12 LAB — TSH: TSH: 1.47 u[IU]/mL (ref 0.450–4.500)

## 2022-12-12 NOTE — Progress Notes (Signed)
Hi Angela Mullen. It was nice to see you yesterday.  Your lab work looks good.  Your kidney function remains stable.  A1c is well controlled at 4.8%.  No concerns at this time. Continue with your current medication regimen.  Follow up as discussed.  Please let me know if you have any questions.

## 2022-12-12 NOTE — Telephone Encounter (Signed)
Copied from [place] [id number]. Topic: General - Other >> Dec 12, 2022  8:36 AM [place] B wrote: Reason for CRM: Nevin Bloodgood from Dr Nicolasa Ducking office, says patient was a previous patient there for the referral and they wont be able to see her again

## 2022-12-12 NOTE — Telephone Encounter (Signed)
Can referral be sent elsewhere please?

## 2022-12-13 ENCOUNTER — Encounter: Payer: Self-pay | Admitting: Nurse Practitioner

## 2022-12-15 ENCOUNTER — Telehealth: Payer: Self-pay

## 2022-12-15 NOTE — Telephone Encounter (Signed)
PA for [facility] initiated and submitted via Cover My Meds. Key: [id number]

## 2022-12-18 MED ORDER — RYBELSUS 7 MG PO TABS: 7.0000 mg | ORAL_TABLET | Freq: Every day | ORAL | 1 refills | Status: DC

## 2022-12-18 NOTE — Telephone Encounter (Signed)
Called and LVM asking for patient to please return my call.   OK for PEC to speak to the patient and advise her of Karen's message.

## 2022-12-18 NOTE — Telephone Encounter (Signed)
Please let patient know that her Angela Mullen was denied because she hasn't tried 3 alternatives.  I want her to stop the Trulicity and start an oral medication called Rybelsus for the next 3 months.  In 3 months she would have "failed 3 therapies" and we should be able to try mounjaro.

## 2022-12-19 NOTE — Telephone Encounter (Signed)
Called and LVM asking for patient to please return my call.    OK for PEC to speak to the patient and advise her of Karen's message.

## 2022-12-20 NOTE — Telephone Encounter (Signed)
Called and LVM asking for patient to please return my call.    OK for PEC to speak to the patient and advise her of Karen's message.

## 2023-01-03 DIAGNOSIS — E1122 Type 2 diabetes mellitus with diabetic chronic kidney disease: Secondary | ICD-10-CM | POA: Diagnosis not present

## 2023-01-03 DIAGNOSIS — I10 Essential (primary) hypertension: Secondary | ICD-10-CM | POA: Diagnosis not present

## 2023-01-03 DIAGNOSIS — G6189 Other inflammatory polyneuropathies: Secondary | ICD-10-CM | POA: Diagnosis not present

## 2023-01-03 DIAGNOSIS — E785 Hyperlipidemia, unspecified: Secondary | ICD-10-CM | POA: Diagnosis not present

## 2023-01-10 DIAGNOSIS — E1122 Type 2 diabetes mellitus with diabetic chronic kidney disease: Secondary | ICD-10-CM | POA: Diagnosis not present

## 2023-01-10 DIAGNOSIS — I10 Essential (primary) hypertension: Secondary | ICD-10-CM | POA: Diagnosis not present

## 2023-01-10 DIAGNOSIS — E785 Hyperlipidemia, unspecified: Secondary | ICD-10-CM | POA: Diagnosis not present

## 2023-01-10 DIAGNOSIS — G629 Polyneuropathy, unspecified: Secondary | ICD-10-CM | POA: Diagnosis not present

## 2023-01-22 ENCOUNTER — Ambulatory Visit: Payer: Medicare Other | Admitting: Nurse Practitioner

## 2023-01-24 ENCOUNTER — Other Ambulatory Visit: Payer: Medicare Other

## 2023-01-31 ENCOUNTER — Other Ambulatory Visit: Payer: Self-pay

## 2023-02-01 ENCOUNTER — Other Ambulatory Visit: Payer: Self-pay | Admitting: Family

## 2023-02-01 ENCOUNTER — Other Ambulatory Visit: Payer: Self-pay | Admitting: Cardiovascular Disease

## 2023-02-01 ENCOUNTER — Other Ambulatory Visit: Payer: Self-pay | Admitting: Nurse Practitioner

## 2023-02-01 DIAGNOSIS — E782 Mixed hyperlipidemia: Secondary | ICD-10-CM

## 2023-02-01 DIAGNOSIS — I10 Essential (primary) hypertension: Secondary | ICD-10-CM

## 2023-02-01 NOTE — Telephone Encounter (Signed)
Patient returned call and setup an appointment for 02/06/23 with Cadence at 8:25 am. She reports she is completely out of meds so will need refills sent to the pharmacy prior to the appt. Please advise.

## 2023-02-01 NOTE — Telephone Encounter (Signed)
last visit Date: 01/24/2022--You will need a follow up appointment in 12 months  next visit 02/06/23

## 2023-02-01 NOTE — Telephone Encounter (Signed)
Left voice mail to schedule appt

## 2023-02-01 NOTE — Telephone Encounter (Signed)
Requested medication (s) are due for refill today:   Provider to review  Requested medication (s) are on the active medication list:   Yes  Future visit scheduled:   Yes 02/26/2023 with Clydie Braun   Last ordered: 09/11/2022 #60, 2 refills  Returned because it's a non delegated refill    Requested Prescriptions  Pending Prescriptions Disp Refills   pregabalin (LYRICA) 150 MG capsule [Pharmacy Med Name: Pregabalin 150 MG Oral Capsule] 60 capsule 0    Sig: Take 2 capsules by mouth once daily     Not Delegated - Neurology:  Anticonvulsants - Controlled - pregabalin Failed - 02/01/2023 11:28 AM      Failed - This refill cannot be delegated      Failed - Cr in normal range and within 360 days    Creatinine  Date Value Ref Range Status  05/11/2013 0.97 0.60 - 1.30 mg/dL Final   Creatinine, Ser  Date Value Ref Range Status  12/11/2022 1.75 (H) 0.57 - 1.00 mg/dL Final         Passed - Completed PHQ-2 or PHQ-9 in the last 360 days      Passed - Valid encounter within last 12 months    Recent Outpatient Visits           1 month ago Welcome to [employer] preventive visit   [place] [facility] Larae Grooms, NP   4 months ago Chronic obstructive pulmonary disease, unspecified COPD type [facility])   [place] [facility] Larae Grooms, NP   7 months ago Essential (primary) hypertension   [place] [facility] Larae Grooms, NP   10 months ago Essential (primary) hypertension   [place] [facility] Larae Grooms, NP   1 year ago Cold sore   [place] [facility] Vigg, Roma Schanz, MD       Future Appointments             In 5 days Fransico Michael, Cadence H, PA-C [facility] [place]   In 3 weeks Larae Grooms, NP [place] [facility], [facility]

## 2023-02-01 NOTE — Telephone Encounter (Signed)
Please contact pt for future appointment. ?Pt due for 12 month f/u. ?Pt needing refills. ?

## 2023-02-06 ENCOUNTER — Ambulatory Visit: Payer: Medicare Other | Admitting: Medical

## 2023-02-07 ENCOUNTER — Telehealth: Payer: Self-pay | Admitting: Cardiovascular Disease

## 2023-02-07 MED ORDER — LISINOPRIL 20 MG PO TABS: 20.0000 mg | ORAL_TABLET | Freq: Every day | ORAL | 0 refills | Status: DC

## 2023-02-07 NOTE — Telephone Encounter (Signed)
*  STAT* If patient is at the pharmacy, call can be transferred to refill team.   1. Which medications need to be refilled? (please list name of each medication and dose if known)   lisinopril (ZESTRIL) 20 MG tablet  atenolol (TENORMIN) 50 MG tablet  amLODipine (NORVASC) 5 MG tablet   2. Which pharmacy/location (including street and city if local pharmacy) is medication to be sent to?  [id number] - [place], [place] - [address]   3. Do they need a 30 day or 90 day supply?  90 day  Patient stated she is completely out of these medications.  Patient has appointment on 6/14.

## 2023-02-07 NOTE — Telephone Encounter (Signed)
Requested Prescriptions   Signed Prescriptions Disp Refills   lisinopril (ZESTRIL) 20 MG tablet 90 tablet 0    Sig: Take 1 tablet (20 mg total) by mouth daily.    Authorizing Provider: Antonieta Iba    Ordering User: Guerry Minors   Refills for Atenolol and Amlodipine were sent on 02/01/23, receipt confirmed with pharmacy.    Called patient and made her aware of refills.

## 2023-02-26 ENCOUNTER — Ambulatory Visit: Payer: Medicare Other | Admitting: Nurse Practitioner

## 2023-02-26 NOTE — Progress Notes (Deleted)
There were no vitals taken for this visit.   Subjective:    Patient ID: Angela Mullen, female    DOB: September 25, 1956, 67 y.o.   MRN: [id number]  HPI: Angela Mullen is a 66 y.o. female  No chief complaint on file.  ANXIETY/DEPRESSION Patient states she feels like the [place] and feels like she is doing well.   She has more motivation and concentration and less mood swings.  She has been table to get the medication now that she is on Medicare.  Denies SI.   Flowsheet Row Office Visit from 12/11/2022 in [facility]  PHQ-9 Total Score 10        12/11/2022    9:12 AM 09/11/2022    9:34 AM 03/22/2022    9:12 AM 01/18/2022    2:41 PM  GAD 7 : Generalized Anxiety Score  Nervous, Anxious, on Edge 1 1 1  0  Control/stop worrying 1 1 1  0  Worry too much - different things 1 1 1  0  Trouble relaxing 1 1 1  0  Restless 0 1 1 0  Easily annoyed or irritable 1 1 2  0  Afraid - awful might happen 0 1 0 0  Total GAD 7 Score 5 7 7  0  Anxiety Difficulty Somewhat difficult Somewhat difficult Somewhat difficult Not difficult at all    CHRONIC KIDNEY DISEASE Patient has been seeing the nephrologist for CKD. They discussed weight loss options to help control diabetes and CKD. Patient has changed her diet and lost some weight. She has a follow up coming up with Dr. Cherylann Mullen CKD status: controlled Medications renally dose: yes Previous renal evaluation: yes Pneumovax:  Up to Date Influenza Vaccine:  Up to Date   HYPERTENSION Hypertension status: controlled  Satisfied with current treatment? no Duration of hypertension: years BP monitoring frequency:  not checking BP range:  BP medication side effects:  no Medication compliance: excellent compliance Previous BP meds: atenolol, amlodipine, and lisinopril Aspirin: no Recurrent headaches: no Visual changes: no Palpitations: no Dyspnea: no Chest pain: no Lower extremity edema: no Dizzy/lightheaded:  no  DIABETES Still taking trulicity weekly.  Feels like it is helping her symptoms.  Hypoglycemic episodes:no Polydipsia/polyuria: no Visual disturbance: no Chest pain: no Paresthesias: no Glucose Monitoring: no  Accucheck frequency: Not Checking  Fasting glucose:  Post prandial:  Evening:  Before meals: Taking Insulin?: no  Long acting insulin:  Short acting insulin: Blood Pressure Monitoring: not checking Retinal Examination: Up to date Foot Exam: Up to Date Diabetic Education: Not Completed Pneumovax: Up to Date Influenza: Up to Date Aspirin: no   Denies HA, CP, SOB, dizziness, palpitations, visual changes, and lower extremity swelling. Denies polyuria and polydipsia.  Relevant past medical, surgical, family and social history reviewed and updated as indicated. Interim medical history since our last visit reviewed. Allergies and medications reviewed and updated.  Review of Systems  Eyes:  Negative for visual disturbance.  Respiratory:  Negative for cough, chest tightness and shortness of breath.   Cardiovascular:  Negative for chest pain, palpitations and leg swelling.  Endocrine: Negative for polydipsia and polyuria.  Neurological:  Negative for dizziness and headaches.  Psychiatric/Behavioral:  Positive for dysphoric mood. Negative for suicidal ideas. The patient is nervous/anxious.     Per HPI unless specifically indicated above     Objective:    There were no vitals taken for this visit.  Wt Readings from Last 3 Encounters:  12/11/22 238 lb 9.6 oz (108.2 kg)  09/11/22 225 lb 14.4 oz (102.5 kg)  06/12/22 224 lb 4.8 oz (101.7 kg)    Physical Exam Vitals and nursing note reviewed.  Constitutional:      General: She is not in acute distress.    Appearance: Normal appearance. She is obese. She is not ill-appearing, toxic-appearing or diaphoretic.  HENT:     Head: Normocephalic.     Right Ear: External ear normal.     Left Ear: External ear normal.      Nose: Nose normal.     Mouth/Throat:     Mouth: Mucous membranes are moist.     Pharynx: Oropharynx is clear.  Eyes:     General:        Right eye: No discharge.        Left eye: No discharge.     Extraocular Movements: Extraocular movements intact.     Conjunctiva/sclera: Conjunctivae normal.     Pupils: Pupils are equal, round, and reactive to light.  Cardiovascular:     Rate and Rhythm: Normal rate and regular rhythm.     Heart sounds: No murmur heard. Pulmonary:     Effort: Pulmonary effort is normal. No respiratory distress.     Breath sounds: Normal breath sounds. No wheezing or rales.  Musculoskeletal:     Cervical back: Normal range of motion and neck supple.  Skin:    General: Skin is warm and dry.     Capillary Refill: Capillary refill takes less than 2 seconds.  Neurological:     General: No focal deficit present.     Mental Status: She is alert and oriented to person, place, and time. Mental status is at baseline.  Psychiatric:        Mood and Affect: Mood normal.        Behavior: Behavior normal.        Thought Content: Thought content normal.        Judgment: Judgment normal.    Results for orders placed or performed in visit on 12/11/22  Microscopic Examination   Urine  Result Value Ref Range   WBC, UA 0-5 0 - 5 /hpf   RBC, Urine None seen 0 - 2 /hpf   Epithelial Cells (non renal) 0-10 0 - 10 /hpf   Casts Present (A) None seen /lpf   Cast Type Hyaline casts N/A   Bacteria, UA None seen None seen/Few  CBC with Differential/Platelet  Result Value Ref Range   WBC 5.9 3.4 - 10.8 x10E3/uL   RBC 3.79 3.77 - 5.28 x10E6/uL   Hemoglobin 11.7 11.1 - 15.9 g/dL   Hematocrit 86.7 61.9 - 46.6 %   MCV 95 79 - 97 fL   MCH 30.9 26.6 - 33.0 pg   MCHC 32.4 31.5 - 35.7 g/dL   RDW 50.9 32.6 - 71.2 %   Platelets 141 (L) 150 - 450 x10E3/uL   Neutrophils 72 Not Estab. %   Lymphs 14 Not Estab. %   Monocytes 7 Not Estab. %   Eos 6 Not Estab. %   Basos 1 Not Estab. %    Neutrophils Absolute 4.3 1.4 - 7.0 x10E3/uL   Lymphocytes Absolute 0.8 0.7 - 3.1 x10E3/uL   Monocytes Absolute 0.4 0.1 - 0.9 x10E3/uL   EOS (ABSOLUTE) 0.3 0.0 - 0.4 x10E3/uL   Basophils Absolute 0.1 0.0 - 0.2 x10E3/uL   Immature Granulocytes 0 Not Estab. %   Immature Grans (Abs) 0.0 0.0 - 0.1 x10E3/uL  Comprehensive metabolic panel  Result Value  Ref Range   Glucose 84 70 - 99 mg/dL   BUN 18 8 - 27 mg/dL   Creatinine, Ser 8.11 (H) 0.57 - 1.00 mg/dL   eGFR 32 (L) >91 YN/WGN/5.62   BUN/Creatinine Ratio 10 (L) 12 - 28   Sodium 141 134 - 144 mmol/L   Potassium 4.4 3.5 - 5.2 mmol/L   Chloride 105 96 - 106 mmol/L   CO2 23 20 - 29 mmol/L   Calcium 9.1 8.7 - 10.3 mg/dL   Total Protein 6.1 6.0 - 8.5 g/dL   Albumin 4.2 3.9 - 4.9 g/dL   Globulin, Total 1.9 1.5 - 4.5 g/dL   Albumin/Globulin Ratio 2.2 1.2 - 2.2   Bilirubin Total 0.4 0.0 - 1.2 mg/dL   Alkaline Phosphatase 98 44 - 121 IU/L   AST 28 0 - 40 IU/L   ALT 27 0 - 32 IU/L  Lipid panel  Result Value Ref Range   Cholesterol, Total 148 100 - 199 mg/dL   Triglycerides 60 0 - 149 mg/dL   HDL 79 >13 mg/dL   VLDL Cholesterol Cal 12 5 - 40 mg/dL   LDL Chol Calc (NIH) 57 0 - 99 mg/dL   Chol/HDL Ratio 1.9 0.0 - 4.4 ratio  TSH  Result Value Ref Range   TSH 1.470 0.450 - 4.500 uIU/mL  Urinalysis, Routine w reflex microscopic  Result Value Ref Range   Specific Gravity, UA 1.015 1.005 - 1.030   pH, UA 5.0 5.0 - 7.5   Color, UA Yellow Yellow   Appearance Ur Clear Clear   Leukocytes,UA Trace (A) Negative   Protein,UA Negative Negative/Trace   Glucose, UA Negative Negative   Ketones, UA Negative Negative   RBC, UA Negative Negative   Bilirubin, UA Negative Negative   Urobilinogen, Ur 0.2 0.2 - 1.0 mg/dL   Nitrite, UA Negative Negative   Microscopic Examination See below:   HgB A1c  Result Value Ref Range   Hgb A1c MFr Bld 4.8 4.8 - 5.6 %   Est. average glucose Bld gHb Est-mCnc 91 mg/dL      Assessment & Plan:   Problem List Items  Addressed This Visit      Cardiovascular and Mediastinum   Essential (primary) hypertension   Senile purpura (HCC) - Primary     Respiratory   Chronic obstructive pulmonary disease (HCC)     Endocrine   Hyperlipidemia associated with type 2 diabetes mellitus (HCC)   DM (diabetes mellitus), type 2 with complications (HCC)     Genitourinary   Chronic kidney disease (CKD), stage III (moderate) (HCC)     Other   Generalized anxiety disorder   Morbid obesity with body mass index of 40.0-49.9 (HCC)   Depression, recurrent (HCC)     Follow up plan: No follow-ups on file.

## 2023-03-05 ENCOUNTER — Other Ambulatory Visit: Payer: Self-pay | Admitting: Cardiovascular Disease

## 2023-03-05 DIAGNOSIS — E782 Mixed hyperlipidemia: Secondary | ICD-10-CM

## 2023-03-09 ENCOUNTER — Ambulatory Visit: Payer: Medicare Other | Admitting: Medical

## 2023-03-13 ENCOUNTER — Other Ambulatory Visit: Payer: Self-pay

## 2023-03-26 ENCOUNTER — Ambulatory Visit: Payer: Medicare Other | Admitting: Family Medicine

## 2023-03-28 ENCOUNTER — Ambulatory Visit: Payer: Medicare Other | Admitting: Dietician

## 2023-04-02 ENCOUNTER — Ambulatory Visit: Payer: Medicare Other | Admitting: Medical

## 2023-04-02 NOTE — Progress Notes (Deleted)
Cardiology Office Note:    Date:  04/02/2023   ID:  Laquista, Shorb 07/12/1956, MRN [id number]  PCP:  Larae Grooms, NP  [facility] Cardiologist:  Julien Nordmann, MD  [facility] Electrophysiologist:  None   Referring MD: Larae Grooms, NP   Chief Complaint: ***  History of Present Illness:    Angela Mullen is a 67 y.o. female with a hx of hypertension, hyperlipidemia, diabetes type 2, obesity status post gastric bypass, CKD stage III, OSA COPD who presents for follow-up.   Per previous documentation she had a cardiac cath in October 2015 showing normal coronary anatomy and normal LV function.  This was performed at [facility] and is unavailable in Care Everywhere.  She had an echocardiogram in July 2018 that showed an EF of 55%, mild LVH, mild aortic stenosis with a mean gradient of 13 mmHg.  Repeat echo in January 2022 showed EF of 55 to 60%, grade 1 diastolic dysfunction, normal LV function, mild aortic regurgitation with mild to moderate aortic valve sclerosis with no stenosis.  Patient was last seen in May 2023 and was overall stable from a cardiac perspective.  Today,  Past Medical History:  Diagnosis Date   Abnormal liver enzymes 09/03/2014   Acid reflux    Allergy    Anemia    H/O   Anxiety    Arthritis    Cataract    COPD (chronic obstructive pulmonary disease) (HCC)    Depression    Diabetes mellitus without complication (HCC)    Dyspnea    WITH EXERTION DUE TO COPD   Elevation of level of transaminase or lactic acid dehydrogenase (LDH) 08/27/2012   Fatty liver    Headache    H/O MIGRAINES   High blood pressure    High cholesterol    Sleep apnea    BIPAP    Past Surgical History:  Procedure Laterality Date   BARIATRIC SURGERY  06/15/2017   CARDIAC CATHETERIZATION     COLONOSCOPY WITH PROPOFOL N/A 10/10/2018   Procedure: COLONOSCOPY WITH PROPOFOL;  Surgeon: Wyline Mood, MD;  Location: [facility] ENDOSCOPY;  Service: Gastroenterology;   Laterality: N/A;   FRACTURE SURGERY  1990   RIGHT LEG    KNEE SURGERY     REPLACEMENT TOTAL KNEE Left    TONSILLECTOMY     TOTAL ABDOMINAL HYSTERECTOMY W/ BILATERAL SALPINGOOPHORECTOMY     TOTAL KNEE ARTHROPLASTY Left 04/28/2020   Procedure: TOTAL KNEE ARTHROPLASTY;  Surgeon: Lyndle Herrlich, MD;  Location: [facility] ORS;  Service: Orthopedics;  Laterality: Left;   TUBAL LIGATION      Current Medications: No outpatient medications have been marked as taking for the 04/02/23 encounter (Appointment) with Fransico Michael,  H, PA-C.     Allergies:   Ketorolac, Prednisone, and Nsaids   Social History   Socioeconomic History   Marital status: Married    Spouse name: Not on file   Number of children: Not on file   Years of education: Not on file   Highest education level: Not on file  Occupational History   Not on file  Tobacco Use   Smoking status: Never   Smokeless tobacco: Never  Vaping Use   Vaping Use: Never used  Substance and Sexual Activity   Alcohol use: Yes    Comment: occassional   Drug use: No   Sexual activity: Not Currently  Other Topics Concern   Not on file  Social History Narrative   Not on file   Social Determinants  of Health   Financial Resource Strain: Not on file  Food Insecurity: Not on file  Transportation Needs: Not on file  Physical Activity: Not on file  Stress: Not on file  Social Connections: Not on file     Family History: The patient's ***family history includes Cerebral aneurysm in her mother; Depression in her sister; Diabetes in her brother, father, and paternal grandfather; Heart attack in her maternal grandfather and sister; Heart disease in her paternal grandfather; High blood pressure in her brother; Hyperlipidemia in her paternal grandfather and son; Hypertension in her sister; Kidney disease in her sister; Obesity in her sister; Stroke in her father, maternal grandmother, and sister.  ROS:   Please see the history of present illness.     *** All other systems reviewed and are negative.  EKGs/Labs/Other Studies Reviewed:    The following studies were reviewed today: ***  EKG:  EKG is *** ordered today.  The ekg ordered today demonstrates ***  Recent Labs: 12/11/2022: ALT 27; BUN 18; Creatinine, Ser 1.75; Hemoglobin 11.7; Platelets 141; Potassium 4.4; Sodium 141; TSH 1.470  Recent Lipid Panel    Component Value Date/Time   CHOL 148 12/11/2022 0952   TRIG 60 12/11/2022 0952   HDL 79 12/11/2022 0952   CHOLHDL 1.9 12/11/2022 0952   LDLCALC 57 12/11/2022 0952     Risk Assessment/Calculations:   {Does this patient have ATRIAL FIBRILLATION?:[phone]}   Physical Exam:    VS:  There were no vitals taken for this visit.    Wt Readings from Last 3 Encounters:  12/11/22 238 lb 9.6 oz (108.2 kg)  09/11/22 225 lb 14.4 oz (102.5 kg)  06/12/22 224 lb 4.8 oz (101.7 kg)     GEN: *** Well nourished, well developed in no acute distress HEENT: Normal NECK: No JVD; No carotid bruits LYMPHATICS: No lymphadenopathy CARDIAC: ***RRR, no murmurs, rubs, gallops RESPIRATORY:  Clear to auscultation without rales, wheezing or rhonchi  ABDOMEN: Soft, non-tender, non-distended MUSCULOSKELETAL:  No edema; No deformity  SKIN: Warm and dry NEUROLOGIC:  Alert and oriented x 3 PSYCHIATRIC:  Normal affect   ASSESSMENT:    No diagnosis found. PLAN:    In order of problems listed above:  ***  Disposition: Follow up {follow up:15908} with ***   Shared Decision Making/Informed Consent   {Are you ordering a CV Procedure (e.g. stress test, cath, DCCV, TEE, etc)?   Press F2        :[id number]}    Signed,  David Stall, PA-C  04/02/2023 8:05 AM    [facility]

## 2023-04-06 ENCOUNTER — Other Ambulatory Visit: Payer: Self-pay | Admitting: Cardiovascular Disease

## 2023-04-16 ENCOUNTER — Ambulatory Visit: Payer: Medicare Other | Admitting: Physician Assistant

## 2023-04-16 DIAGNOSIS — G4733 Obstructive sleep apnea (adult) (pediatric): Secondary | ICD-10-CM | POA: Diagnosis not present

## 2023-04-18 ENCOUNTER — Other Ambulatory Visit: Payer: Self-pay | Admitting: Cardiovascular Disease

## 2023-04-18 DIAGNOSIS — E782 Mixed hyperlipidemia: Secondary | ICD-10-CM

## 2023-04-19 ENCOUNTER — Other Ambulatory Visit: Payer: Self-pay | Admitting: Nurse Practitioner

## 2023-04-20 NOTE — Telephone Encounter (Signed)
Requested medication (s) are due for refill today -yes  Requested medication (s) are on the active medication list -yes  Future visit scheduled -no  Last refill: 11/14/22 30g  Notes to clinic: off protocol- provider review   Requested Prescriptions  Pending Prescriptions Disp Refills   clotrimazole-betamethasone (LOTRISONE) cream [Pharmacy Med Name: Clotrimazole-Betamethasone 1-0.05 % External Cream] 30 g 0    Sig: APPLY TOPICALLY TWICE DAILY AS NEEDED     Off-Protocol Failed - 04/19/2023 10:48 PM      Failed - Medication not assigned to a protocol, review manually.      Passed - Valid encounter within last 12 months    Recent Outpatient Visits           4 months ago Welcome to [employer] preventive visit   [place] [facility] Larae Grooms, NP   7 months ago Chronic obstructive pulmonary disease, unspecified COPD type [facility])   [place] [facility] Larae Grooms, NP   10 months ago Essential (primary) hypertension   [place] [facility] Larae Grooms, NP   1 year ago Essential (primary) hypertension   [place] [facility] Larae Grooms, NP   1 year ago Cold sore   [place] [facility] Vigg, Avanti, MD       Future Appointments             In 3 weeks Charlsie Quest, NP [place] [facility]               Requested Prescriptions  Pending Prescriptions Disp Refills   clotrimazole-betamethasone (LOTRISONE) cream [Pharmacy Med Name: Clotrimazole-Betamethasone 1-0.05 % External Cream] 30 g 0    Sig: APPLY TOPICALLY TWICE DAILY AS NEEDED     Off-Protocol Failed - 04/19/2023 10:48 PM      Failed - Medication not assigned to a protocol, review manually.      Passed - Valid encounter within last 12 months    Recent Outpatient Visits           4 months ago Welcome to [employer] preventive visit   [place] [facility] Larae Grooms, NP   7  months ago Chronic obstructive pulmonary disease, unspecified COPD type [facility])   [place] [facility] Larae Grooms, NP   10 months ago Essential (primary) hypertension   [place] [facility] Larae Grooms, NP   1 year ago Essential (primary) hypertension   [place] [facility] Larae Grooms, NP   1 year ago Cold sore   [place] [facility] Vigg, Avanti, MD       Future Appointments             In 3 weeks Charlsie Quest, NP [place] [facility]

## 2023-04-25 ENCOUNTER — Other Ambulatory Visit: Payer: Self-pay | Admitting: Nurse Practitioner

## 2023-04-25 NOTE — Telephone Encounter (Signed)
Refill for 30 day supply due to insurance coverage.

## 2023-04-25 NOTE — Telephone Encounter (Signed)
Medication Refill - Medication: Semaglutide (RYBELSUS) 7 MG TABS   Has the patient contacted their pharmacy? No.  Preferred Pharmacy (with phone number or street name):  [facility] [address], [place] - [address] Phone: [phone]  Fax: [phone]     Has the patient been seen for an appointment in the last year OR does the patient have an upcoming appointment? Yes.    Agent: Please be advised that RX refills may take up to 3 business days. We ask that you follow-up with your pharmacy.  Dawn with [employer] called and request patients script be written for a 30 day supply as she is in the middle of a coverage gap and 30 days supply would be cheaper for her.

## 2023-04-26 DIAGNOSIS — E119 Type 2 diabetes mellitus without complications: Secondary | ICD-10-CM | POA: Diagnosis not present

## 2023-04-26 NOTE — Telephone Encounter (Signed)
Please find out if she has enough medicine to make it to her appt next week

## 2023-04-26 NOTE — Telephone Encounter (Signed)
Requested medication (s) are due for refill today: no  Requested medication (s) are on the active medication list: yes  Last refill:  12/18/22 #90 1 RF  Future visit scheduled: yes  Notes to clinic:  med not assigned to a protocol   Requested Prescriptions  Pending Prescriptions Disp Refills   Semaglutide (RYBELSUS) 7 MG TABS 90 tablet 1    Sig: Take 1 tablet (7 mg total) by mouth daily.     Off-Protocol Failed - 04/25/2023  4:00 PM      Failed - Medication not assigned to a protocol, review manually.      Passed - Valid encounter within last 12 months    Recent Outpatient Visits           4 months ago Welcome to [employer] preventive visit   [place] [facility] Larae Grooms, NP   7 months ago Chronic obstructive pulmonary disease, unspecified COPD type [facility])   [place] [facility] Larae Grooms, NP   10 months ago Essential (primary) hypertension   [place] [facility] Larae Grooms, NP   1 year ago Essential (primary) hypertension   [place] [facility] Larae Grooms, NP   1 year ago Cold sore   [place] [facility] Vigg, Avanti, MD       Future Appointments             In 4 days Mecum, Oswaldo Conroy, PA-C [facility], [facility]   In 1 week Furth, Cadence H, PA-C [facility] at [place]   In 2 weeks Charlsie Quest, NP [employer] at [place]

## 2023-04-27 NOTE — Telephone Encounter (Signed)
Left message for patient to give our office a call back to clarify if she has enough medication to get her to her scheduled appointment.    OK for [facility] to gather clarification if the patient calls back.

## 2023-04-27 NOTE — Telephone Encounter (Signed)
Pt called to report that she has enough to last her until Monday when she is scheduled to see Denny Peon Mecum

## 2023-04-30 ENCOUNTER — Ambulatory Visit: Payer: Medicare Other | Admitting: Physician Assistant

## 2023-05-03 ENCOUNTER — Ambulatory Visit: Payer: Medicare Other | Admitting: Medical

## 2023-05-04 ENCOUNTER — Other Ambulatory Visit: Payer: Self-pay | Admitting: Cardiovascular Disease

## 2023-05-04 DIAGNOSIS — I10 Essential (primary) hypertension: Secondary | ICD-10-CM

## 2023-05-07 ENCOUNTER — Ambulatory Visit: Payer: Medicare Other | Admitting: Physician Assistant

## 2023-05-08 ENCOUNTER — Other Ambulatory Visit: Payer: Self-pay

## 2023-05-11 ENCOUNTER — Ambulatory Visit: Payer: Medicare Other | Admitting: Cardiology

## 2023-05-16 ENCOUNTER — Ambulatory Visit (INDEPENDENT_AMBULATORY_CARE_PROVIDER_SITE_OTHER): Payer: Medicare Other | Admitting: Physician Assistant

## 2023-05-16 ENCOUNTER — Encounter: Payer: Self-pay | Admitting: Physician Assistant

## 2023-05-16 ENCOUNTER — Other Ambulatory Visit: Payer: Self-pay | Admitting: Cardiovascular Disease

## 2023-05-16 VITALS — BP 126/76 | HR 51 | Ht 65.0 in | Wt 255.4 lb

## 2023-05-16 DIAGNOSIS — I10 Essential (primary) hypertension: Secondary | ICD-10-CM

## 2023-05-16 DIAGNOSIS — N183 Chronic kidney disease, stage 3 unspecified: Secondary | ICD-10-CM

## 2023-05-16 DIAGNOSIS — E118 Type 2 diabetes mellitus with unspecified complications: Secondary | ICD-10-CM | POA: Diagnosis not present

## 2023-05-16 DIAGNOSIS — E1169 Type 2 diabetes mellitus with other specified complication: Secondary | ICD-10-CM | POA: Diagnosis not present

## 2023-05-16 DIAGNOSIS — F339 Major depressive disorder, recurrent, unspecified: Secondary | ICD-10-CM

## 2023-05-16 DIAGNOSIS — E785 Hyperlipidemia, unspecified: Secondary | ICD-10-CM | POA: Diagnosis not present

## 2023-05-16 DIAGNOSIS — Z7985 Long-term (current) use of injectable non-insulin antidiabetic drugs: Secondary | ICD-10-CM

## 2023-05-16 MED ORDER — DULOXETINE HCL 60 MG PO CPEP: 60.0000 mg | ORAL_CAPSULE | Freq: Every day | ORAL | 1 refills | Status: DC

## 2023-05-16 MED ORDER — TRAZODONE HCL 100 MG PO TABS: 200.0000 mg | ORAL_TABLET | Freq: Every evening | ORAL | 1 refills | Status: DC | PRN

## 2023-05-16 MED ORDER — DULOXETINE HCL 30 MG PO CPEP: 30.0000 mg | ORAL_CAPSULE | Freq: Every day | ORAL | 1 refills | Status: DC

## 2023-05-16 MED ORDER — BUPROPION HCL ER (SR) 150 MG PO TB12: 150.0000 mg | ORAL_TABLET | Freq: Two times a day (BID) | ORAL | 1 refills | Status: AC

## 2023-05-16 MED ORDER — OZEMPIC (0.25 OR 0.5 MG/DOSE) 2 MG/3ML ~~LOC~~ SOPN
0.2500 mg | PEN_INJECTOR | SUBCUTANEOUS | 1 refills | Status: DC
Start: 2023-05-16 — End: 2024-02-04

## 2023-05-16 NOTE — Progress Notes (Signed)
Established Patient Office Visit  Name: Angela Mullen   MRN: [id number]    DOB: Jun 08, 1956   Date:05/18/2023  Today's Provider: Jacquelin Hawking, MHS, PA-C Introduced myself to the patient as a PA-C and provided education on APPs in clinical practice.         Subjective  Chief Complaint  Chief Complaint  Patient presents with   Diabetes    Patient says the prescription of Rybelsus for a 90-day supply would be around $500+ and they informed her a 30-day supply may be cheaper, but still cost effectively. Patient would like to discuss the medication for her Diabetes. Patient would like to discuss going back to Ozempic.    Weight Gain    Patient says she has noticed a weight gain and it has caused her more depression. Patient says she has noticed an increase in her appetite and says she is not showing any interest in trying to improve to herself.     HPI  Diabetes, Type 2 - Last A1c 4.8 - Medications: She has not been able to take the Rybelsus for the past 3 weeks due to cost. She would like to go back on Ozempic to manage this  She reports she has put on about 40 lbs since she had to get off of it  - Compliance: good compliance  - Checking BG at home:  She is checking but would like to see if she can get a CGM. She is checking twice per day. Fasting is usually 150s and PM is 180s-200s  - Diet: "I'm eating wrong, I'm waking up at 3-4 AM hungry"  - Eye exam: UTD - Foot exam: UTD - Microalbumin: UTD - Statin: on statin  - PNA vaccine: Completed  - Denies symptoms of hypoglycemia, polyuria, polydipsia, numbness extremities, foot ulcers/trauma   HYPERTENSION / HYPERLIPIDEMIA Satisfied with current treatment? yes Duration of hypertension: years BP monitoring frequency: rarely BP range:  BP medication side effects: no Past BP meds: amlodipine, atenolol, and lisinopril Duration of hyperlipidemia: years Cholesterol medication side effects: no Cholesterol supplements:  none Past cholesterol medications: lovastatin (mevacor) Medication compliance: good compliance Aspirin: no Recent stressors: no Recurrent headaches: yes Visual changes: no Palpitations: yes Dyspnea: no Chest pain: no Lower extremity edema: no Dizzy/lightheaded: no    Depression/Anxiety  She reports she has been lacking motivation and feeling down She reports that she has felt like a zombie since starting the Jordan  She would like to stop the Latuda at this time to see if that will help her feel better  She is wondering if she can also try to taper off the Lyrica       05/16/2023    8:55 AM 12/11/2022    9:12 AM 09/11/2022    9:34 AM 03/22/2022    9:12 AM 01/18/2022    2:40 PM  Depression screen PHQ 2/9  Decreased Interest 3 1 1 2  0  Down, Depressed, Hopeless 2 1 1 2  0  PHQ - 2 Score 5 2 2 4  0  Altered sleeping 3 1 1 2  0  Tired, decreased energy 3 1 2 3  0  Change in appetite 3 2 2 2  0  Feeling bad or failure about yourself  3 1 1 2  0  Trouble concentrating 2 2 1 2  0  Moving slowly or fidgety/restless 2 1 1 2  0  Suicidal thoughts 1 0 1 0 0  PHQ-9 Score 22 10 11 17  0  Difficult doing work/chores Somewhat difficult Somewhat difficult Somewhat difficult Not difficult at all Not difficult at all      05/16/2023    8:56 AM 12/11/2022    9:12 AM 09/11/2022    9:34 AM 03/22/2022    9:12 AM  GAD 7 : Generalized Anxiety Score  Nervous, Anxious, on Edge 1 1 1 1   Control/stop worrying 2 1 1 1   Worry too much - different things 1 1 1 1   Trouble relaxing 3 1 1 1   Restless 1 0 1 1  Easily annoyed or irritable 1 1 1 2   Afraid - awful might happen 1 0 1 0  Total GAD 7 Score 10 5 7 7   Anxiety Difficulty Somewhat difficult Somewhat difficult Somewhat difficult Somewhat difficult         Patient Active Problem List   Diagnosis Date Noted   Advanced care planning/counseling discussion 12/11/2022   Senile purpura (HCC) 12/11/2022   Cold sore 01/18/2022   Anxiety     Depression, recurrent (HCC) 03/02/2021   S/P TKR (total knee replacement) using cement, left 04/28/2020   Binge eating disorder 06/14/2018   H/O gastric bypass 06/04/2018   Arthritis 12/29/2015   GERD (gastroesophageal reflux disease) 12/29/2015   OSA (obstructive sleep apnea) 12/29/2015   Generalized anxiety disorder 12/29/2015   Insomnia, persistent 11/09/2015   Generalized OA 08/19/2015   Polyneuropathy 12/30/2014   Chronic kidney disease (CKD), stage III (moderate) (HCC) 12/09/2014   Morbid obesity with body mass index of 40.0-49.9 (HCC) 09/03/2014   History of cardiac catheterization 07/31/2014   Chronic obstructive pulmonary disease (HCC) 03/25/2013   Essential (primary) hypertension 03/25/2013   DM (diabetes mellitus), type 2 with complications (HCC) 03/25/2013   NASH (nonalcoholic steatohepatitis) 09/04/2012   Hyperlipidemia associated with type 2 diabetes mellitus (HCC) 08/27/2012   Restless leg 03/05/2012    Past Surgical History:  Procedure Laterality Date   BARIATRIC SURGERY  06/15/2017   CARDIAC CATHETERIZATION     COLONOSCOPY WITH PROPOFOL N/A 10/10/2018   Procedure: COLONOSCOPY WITH PROPOFOL;  Surgeon: Wyline Mood, MD;  Location: [facility] ENDOSCOPY;  Service: Gastroenterology;  Laterality: N/A;   FRACTURE SURGERY  1990   RIGHT LEG    KNEE SURGERY     REPLACEMENT TOTAL KNEE Left    TONSILLECTOMY     TOTAL ABDOMINAL HYSTERECTOMY W/ BILATERAL SALPINGOOPHORECTOMY     TOTAL KNEE ARTHROPLASTY Left 04/28/2020   Procedure: TOTAL KNEE ARTHROPLASTY;  Surgeon: Lyndle Herrlich, MD;  Location: [facility] ORS;  Service: Orthopedics;  Laterality: Left;   TUBAL LIGATION      Family History  Problem Relation Age of Onset   Cerebral aneurysm Mother    Stroke Father    Diabetes Father    Depression Sister    Obesity Sister    Kidney disease Sister    Heart attack Sister    Stroke Sister    Hypertension Sister    Diabetes Brother    High blood pressure Brother    Heart attack  Maternal Grandfather    Hyperlipidemia Son    Stroke Maternal Grandmother    Diabetes Paternal Grandfather    Hyperlipidemia Paternal Grandfather    Heart disease Paternal Grandfather     Social History   Tobacco Use   Smoking status: Never   Smokeless tobacco: Never  Substance Use Topics   Alcohol use: Yes    Comment: occassional     Current Outpatient Medications:    acetaminophen (TYLENOL) 500 MG tablet, Take 1,000 mg  by mouth every 6 (six) hours as needed for moderate pain. , Disp: , Rfl:    albuterol (VENTOLIN HFA) 108 (90 Base) MCG/ACT inhaler, Inhale 1-2 puffs into the lungs every 6 (six) hours as needed for wheezing or shortness of breath., Disp: 54 g, Rfl: 3   amLODipine (NORVASC) 5 MG tablet, Take 1 tablet by mouth once daily, Disp: 90 tablet, Rfl: 0   clotrimazole-betamethasone (LOTRISONE) cream, APPLY TOPICALLY TWICE DAILY AS NEEDED, Disp: 30 g, Rfl: 0   colchicine 0.6 MG tablet, Take 1 tablet (0.6 mg total) by mouth daily. Take at the onset of symptoms for 5 days, Disp: 10 tablet, Rfl: 1   lisinopril (ZESTRIL) 20 MG tablet, Take 1 tablet (20 mg total) by mouth daily., Disp: 90 tablet, Rfl: 0   lovastatin (MEVACOR) 40 MG tablet, Take 1 tablet (40 mg total) by mouth at bedtime. Please keep appointment on 05/11/23 for further refills. Thank you., Disp: 30 tablet, Rfl: 0   methocarbamol (ROBAXIN) 750 MG tablet, TAKE 1 TABLET (750 MG TOTAL) BY MOUTH EVERY 8 (EIGHT) HOURS AS NEEDED., Disp: 90 tablet, Rfl: 0   pregabalin (LYRICA) 150 MG capsule, Take 2 capsules by mouth once daily, Disp: 180 capsule, Rfl: 1   Semaglutide,0.25 or 0.5MG /DOS, (OZEMPIC, 0.25 OR 0.5 MG/DOSE,) 2 MG/3ML SOPN, Inject 0.25 mg into the skin once a week., Disp: 3 mL, Rfl: 1   atenolol (TENORMIN) 50 MG tablet, TAKE 1 TABLET BY MOUTH ONCE DAILY IN THE MORNING AND 2 ONCE DAILY IN THE EVENING. KEEP APPT FOR FURTHER REFILL, Disp: 90 tablet, Rfl: 0   buPROPion (WELLBUTRIN SR) 150 MG 12 hr tablet, Take 1 tablet  (150 mg total) by mouth 2 (two) times daily., Disp: 180 tablet, Rfl: 1   DULoxetine (CYMBALTA) 30 MG capsule, Take 1 capsule (30 mg total) by mouth at bedtime., Disp: 90 capsule, Rfl: 1   DULoxetine (CYMBALTA) 60 MG capsule, Take 1 capsule (60 mg total) by mouth at bedtime., Disp: 90 capsule, Rfl: 1   traZODone (DESYREL) 100 MG tablet, Take 2 tablets (200 mg total) by mouth at bedtime as needed for sleep., Disp: 180 tablet, Rfl: 1  Allergies  Allergen Reactions   Ketorolac Other (See Comments)    Altered Mental Status-PT DENIES THIS ALLERGY   Prednisone Other (See Comments)    "wheezing"   Nsaids     DUE TO HAVING GASTRIC BYPASS SURGERY    I personally reviewed active problem list, medication list, allergies, health maintenance, notes from last encounter, lab results with the patient/caregiver today.   Review of Systems  Eyes:  Negative for blurred vision and double vision.  Respiratory:  Negative for shortness of breath and wheezing.   Cardiovascular:  Positive for palpitations. Negative for chest pain and leg swelling.  Neurological:  Positive for headaches.  Psychiatric/Behavioral:  Positive for depression and suicidal ideas (passive thoughts, she denies having a plan). The patient is nervous/anxious.       Objective  Vitals:   05/16/23 0847  BP: 126/76  Pulse: (!) 51  SpO2: 97%  Weight: 255 lb 6.4 oz (115.8 kg)  Height: 5\' 5"  (1.651 m)    Body mass index is 42.5 kg/m.  Physical Exam Vitals reviewed.  Constitutional:      General: She is awake.     Appearance: Normal appearance. She is well-developed and well-groomed.  HENT:     Head: Normocephalic and atraumatic.  Cardiovascular:     Rate and Rhythm: Normal rate and regular rhythm.  Pulses: Normal pulses.          Radial pulses are 2+ on the right side and 2+ on the left side.     Heart sounds: Normal heart sounds. No murmur heard.    No friction rub. No gallop.  Pulmonary:     Effort: Pulmonary effort  is normal.     Breath sounds: Normal breath sounds. No decreased air movement. No decreased breath sounds, wheezing, rhonchi or rales.  Musculoskeletal:     Right lower leg: No edema.     Left lower leg: No edema.  Neurological:     Mental Status: She is alert.  Psychiatric:        Attention and Perception: Attention and perception normal.        Mood and Affect: Mood is depressed.        Speech: Speech normal.        Behavior: Behavior normal. Behavior is cooperative.        Thought Content: Thought content normal.        Cognition and Memory: Cognition normal.      Recent Results (from the past 2160 hour(s))  Comp Met (CMET)     Status: Abnormal   Collection Time: 05/16/23  9:29 AM  Result Value Ref Range   Glucose 84 70 - 99 mg/dL   BUN 26 8 - 27 mg/dL   Creatinine, Ser 5.28 (H) 0.57 - 1.00 mg/dL   eGFR 31 (L) >41 LK/GMW/1.02   BUN/Creatinine Ratio 14 12 - 28   Sodium 144 134 - 144 mmol/L   Potassium 4.5 3.5 - 5.2 mmol/L   Chloride 108 (H) 96 - 106 mmol/L   CO2 22 20 - 29 mmol/L   Calcium 8.8 8.7 - 10.3 mg/dL   Total Protein 6.1 6.0 - 8.5 g/dL   Albumin 4.0 3.9 - 4.9 g/dL   Globulin, Total 2.1 1.5 - 4.5 g/dL   Bilirubin Total 0.3 0.0 - 1.2 mg/dL   Alkaline Phosphatase 115 44 - 121 IU/L   AST 29 0 - 40 IU/L   ALT 32 0 - 32 IU/L  CBC w/Diff     Status: Abnormal   Collection Time: 05/16/23  9:29 AM  Result Value Ref Range   WBC 6.0 3.4 - 10.8 x10E3/uL   RBC 3.95 3.77 - 5.28 x10E6/uL   Hemoglobin 12.1 11.1 - 15.9 g/dL   Hematocrit 72.5 36.6 - 46.6 %   MCV 94 79 - 97 fL   MCH 30.6 26.6 - 33.0 pg   MCHC 32.4 31.5 - 35.7 g/dL   RDW 44.0 34.7 - 42.5 %   Platelets 153 150 - 450 x10E3/uL   Neutrophils 61 Not Estab. %   Lymphs 15 Not Estab. %   Monocytes 6 Not Estab. %   Eos 16 Not Estab. %   Basos 2 Not Estab. %   Neutrophils Absolute 3.7 1.4 - 7.0 x10E3/uL   Lymphocytes Absolute 0.9 0.7 - 3.1 x10E3/uL   Monocytes Absolute 0.4 0.1 - 0.9 x10E3/uL   EOS (ABSOLUTE)  1.0 (H) 0.0 - 0.4 x10E3/uL   Basophils Absolute 0.1 0.0 - 0.2 x10E3/uL   Immature Granulocytes 0 Not Estab. %   Immature Grans (Abs) 0.0 0.0 - 0.1 x10E3/uL  HgB A1c     Status: None   Collection Time: 05/16/23  9:29 AM  Result Value Ref Range   Hgb A1c MFr Bld 5.5 4.8 - 5.6 %    Comment:  Prediabetes: 5.7 - 6.4          Diabetes: >6.4          Glycemic control for adults with diabetes: <7.0    Est. average glucose Bld gHb Est-mCnc 111 mg/dL  Lipid Profile     Status: None   Collection Time: 05/16/23  9:29 AM  Result Value Ref Range   Cholesterol, Total 142 100 - 199 mg/dL   Triglycerides 70 0 - 149 mg/dL   HDL 64 >13 mg/dL   VLDL Cholesterol Cal 14 5 - 40 mg/dL   LDL Chol Calc (NIH) 64 0 - 99 mg/dL   Chol/HDL Ratio 2.2 0.0 - 4.4 ratio    Comment:                                   T. Chol/HDL Ratio                                             Men  Women                               1/2 Avg.Risk  3.4    3.3                                   Avg.Risk  5.0    4.4                                2X Avg.Risk  9.6    7.1                                3X Avg.Risk 23.4   11.0      PHQ2/9:    05/16/2023    8:55 AM 12/11/2022    9:12 AM 09/11/2022    9:34 AM 03/22/2022    9:12 AM 01/18/2022    2:40 PM  Depression screen PHQ 2/9  Decreased Interest 3 1 1 2  0  Down, Depressed, Hopeless 2 1 1 2  0  PHQ - 2 Score 5 2 2 4  0  Altered sleeping 3 1 1 2  0  Tired, decreased energy 3 1 2 3  0  Change in appetite 3 2 2 2  0  Feeling bad or failure about yourself  3 1 1 2  0  Trouble concentrating 2 2 1 2  0  Moving slowly or fidgety/restless 2 1 1 2  0  Suicidal thoughts 1 0 1 0 0  PHQ-9 Score 22 10 11 17  0  Difficult doing work/chores Somewhat difficult Somewhat difficult Somewhat difficult Not difficult at all Not difficult at all      Fall Risk:    05/16/2023    8:55 AM 12/11/2022    9:12 AM 09/11/2022    9:29 AM 03/22/2022    9:12 AM 01/18/2022    2:40 PM  Fall Risk   Falls in  the past year? 1 0 0 0 0  Number falls in past yr: 1 0 0 0 0  Injury with Fall? 1 0 0 0 0  Risk  for fall due to : History of fall(s) No Fall Risks No Fall Risks No Fall Risks No Fall Risks  Follow up Falls evaluation completed Falls evaluation completed Falls evaluation completed Falls evaluation completed Falls evaluation completed      Functional Status Survey:      Assessment & Plan  Problem List Items Addressed This Visit       Cardiovascular and Mediastinum   Essential (primary) hypertension    Chronic, historic condition Appears well-managed today with current regimen comprised of lisinopril 20 mg p.o. daily, amlodipine 5 mg p.o. daily, atenolol 50 mg p.o. daily Continue current regimen Follow-up in 3 months or sooner if concerns arise       Relevant Orders   Comp Met (CMET) (Completed)   CBC w/Diff (Completed)     Endocrine   Hyperlipidemia associated with type 2 diabetes mellitus (HCC)    Chronic, historic condition Appears well-controlled on current regimen comprised of lovastatin 40 mg p.o. daily Recheck lipid panel today, results to dictate further management Continue current regimen for now Follow-up in 3 months or sooner if concerns arise      Relevant Medications   Semaglutide,0.25 or 0.5MG /DOS, (OZEMPIC, 0.25 OR 0.5 MG/DOSE,) 2 MG/3ML SOPN   Other Relevant Orders   Lipid Profile (Completed)   DM (diabetes mellitus), type 2 with complications (HCC) - Primary    Chronic, historic condition Most recent A1c was 4.8-recheck today-results to dictate further management She was previously on Ozempic and felt like this managed her diabetes as well as provided additional weight loss without concerning side effects but this came cost prohibitive when her insurance changed. She has not been able to continue on Trulicity and has tried Rybelsus.  She reports the Rybelsus was cost-prohibitive and she reports back pains while taking it along with lightheadedness  At  this time it appears that she has failed 3 medications as she was not able to get [facility] so we will try restarting her on Ozempic 0.25 mg weekly injection was increased to 0.5 mg weekly injection in 4 weeks She reports that her fasting glucose levels have been elevated in the 150s and that she is constantly waking up feeling hungry. Will consider putting in CGM order at next visit Follow-up in 3 months or sooner if concerns arise       Relevant Medications   Semaglutide,0.25 or 0.5MG /DOS, (OZEMPIC, 0.25 OR 0.5 MG/DOSE,) 2 MG/3ML SOPN   Other Relevant Orders   HgB A1c (Completed)     Genitourinary   Chronic kidney disease (CKD), stage III (moderate) (HCC)    Chronic, start condition She is currently estimated to be in stage IIIb CKD and is followed by nephrology Her most recent appointment was in April 2024 Reviewed most recent notes-she is advised to avoid nephrotoxic, nephro taxing medications, encouraged to keep diabetes well-managed and stay well-hydrated Nephrology also advised appropriate dietary management Will check CMP at this time.  Continue collaboration with nephrology Follow-up 3 months or sooner if concerns arise      Relevant Orders   Comp Met (CMET) (Completed)     Other   Depression, recurrent (HCC)    Chronic, distal condition, appears exacerbated at this time She states that starting Latuda she has felt more anhedonia and depressed mood She states she does not have much drive or motivation to do much and her weight has been upsetting to her as she does not have the energy to pursue healthy diet and exercise right now Will try  getting her back on Ozempic to assist with weight Will try tapering her off the Latuda as I suspect this may be contributing to her symptoms  She is also taking Wellbutrin 150 mg PO BID and Duloxetine 60 mg PO every day - continue these for now Reviewed PHQ9 and GAD7- PHQ is significantly elevated today but she denies active SI right  now Recommend she wait until she is completely off Latuda before we try changing Lyrica  Follow up in 6 weeks or sooner if concerns arise       Relevant Medications   DULoxetine (CYMBALTA) 60 MG capsule   DULoxetine (CYMBALTA) 30 MG capsule   buPROPion (WELLBUTRIN SR) 150 MG 12 hr tablet   traZODone (DESYREL) 100 MG tablet     Return in about 6 weeks (around 06/27/2023) for Depression.   I, Coulton Schlink E Arletha Marschke, PA-C, have reviewed all documentation for this visit. The documentation on 05/18/23 for the exam, diagnosis, procedures, and orders are all accurate and complete.   Jacquelin Hawking, MHS, PA-C [facility]

## 2023-05-16 NOTE — Assessment & Plan Note (Addendum)
Chronic, historic condition Most recent A1c was 4.8-recheck today-results to dictate further management She was previously on Ozempic and felt like this managed her diabetes as well as provided additional weight loss without concerning side effects but this came cost prohibitive when her insurance changed. She has not been able to continue on Trulicity and has tried Rybelsus.  She reports the Rybelsus was cost-prohibitive and she reports back pains while taking it along with lightheadedness  At this time it appears that she has failed 3 medications as she was not able to get [facility] so we will try restarting her on Ozempic 0.25 mg weekly injection was increased to 0.5 mg weekly injection in 4 weeks She reports that her fasting glucose levels have been elevated in the 150s and that she is constantly waking up feeling hungry. Will consider putting in CGM order at next visit Follow-up in 3 months or sooner if concerns arise

## 2023-05-16 NOTE — Patient Instructions (Addendum)
  To taper off the Latuda please do the following Cut the Latuda in half and take 10 mg once per day for one week Then take a half tablet every other day for one week. If needed you can try taking a half tablet every 2-3 days or stop completely if you feel like you are not having withdrawal.    Wait about 4 weeks before trying to reduce your Lyrica. We don't want to make too many changes at once and cause more mood concerns Please call the office if you start to have more severe depression symptoms, thoughts of hurting yourself or others.

## 2023-05-17 LAB — COMPREHENSIVE METABOLIC PANEL
ALT: 32 IU/L (ref 0–32)
AST: 29 IU/L (ref 0–40)
Albumin: 4 g/dL (ref 3.9–4.9)
Alkaline Phosphatase: 115 IU/L (ref 44–121)
BUN/Creatinine Ratio: 14 (ref 12–28)
BUN: 26 mg/dL (ref 8–27)
Bilirubin Total: 0.3 mg/dL (ref 0.0–1.2)
CO2: 22 mmol/L (ref 20–29)
Calcium: 8.8 mg/dL (ref 8.7–10.3)
Chloride: 108 mmol/L — ABNORMAL HIGH (ref 96–106)
Creatinine, Ser: 1.8 mg/dL — ABNORMAL HIGH (ref 0.57–1.00)
Globulin, Total: 2.1 g/dL (ref 1.5–4.5)
Glucose: 84 mg/dL (ref 70–99)
Potassium: 4.5 mmol/L (ref 3.5–5.2)
Sodium: 144 mmol/L (ref 134–144)
Total Protein: 6.1 g/dL (ref 6.0–8.5)
eGFR: 31 mL/min/{1.73_m2} — ABNORMAL LOW (ref >59)

## 2023-05-17 LAB — CBC WITH DIFFERENTIAL/PLATELET
Basophils Absolute: 0.1 10*3/uL (ref 0.0–0.2)
Basos: 2 %
EOS (ABSOLUTE): 1 10*3/uL — ABNORMAL HIGH (ref 0.0–0.4)
Eos: 16 %
Hematocrit: 37.3 % (ref 34.0–46.6)
Hemoglobin: 12.1 g/dL (ref 11.1–15.9)
Immature Grans (Abs): 0 10*3/uL (ref 0.0–0.1)
Immature Granulocytes: 0 %
Lymphocytes Absolute: 0.9 10*3/uL (ref 0.7–3.1)
Lymphs: 15 %
MCH: 30.6 pg (ref 26.6–33.0)
MCHC: 32.4 g/dL (ref 31.5–35.7)
MCV: 94 fL (ref 79–97)
Monocytes Absolute: 0.4 10*3/uL (ref 0.1–0.9)
Monocytes: 6 %
Neutrophils Absolute: 3.7 10*3/uL (ref 1.4–7.0)
Neutrophils: 61 %
Platelets: 153 10*3/uL (ref 150–450)
RBC: 3.95 x10E6/uL (ref 3.77–5.28)
RDW: 12 % (ref 11.7–15.4)
WBC: 6 10*3/uL (ref 3.4–10.8)

## 2023-05-17 LAB — LIPID PANEL
Chol/HDL Ratio: 2.2 ratio (ref 0.0–4.4)
Cholesterol, Total: 142 mg/dL (ref 100–199)
HDL: 64 mg/dL (ref >39)
LDL Chol Calc (NIH): 64 mg/dL (ref 0–99)
Triglycerides: 70 mg/dL (ref 0–149)
VLDL Cholesterol Cal: 14 mg/dL (ref 5–40)

## 2023-05-17 LAB — HEMOGLOBIN A1C
Est. average glucose Bld gHb Est-mCnc: 111 mg/dL
Hgb A1c MFr Bld: 5.5 % (ref 4.8–5.6)

## 2023-05-18 NOTE — Assessment & Plan Note (Signed)
Chronic, historic condition Appears well-controlled on current regimen comprised of lovastatin 40 mg p.o. daily Recheck lipid panel today, results to dictate further management Continue current regimen for now Follow-up in 3 months or sooner if concerns arise

## 2023-05-18 NOTE — Progress Notes (Signed)
Labs are normal/stable.

## 2023-05-18 NOTE — Assessment & Plan Note (Signed)
Chronic, distal condition, appears exacerbated at this time She states that starting Jordan she has felt more anhedonia and depressed mood She states she does not have much drive or motivation to do much and her weight has been upsetting to her as she does not have the energy to pursue healthy diet and exercise right now Will try getting her back on Ozempic to assist with weight Will try tapering her off the Latuda as I suspect this may be contributing to her symptoms  She is also taking Wellbutrin 150 mg PO BID and Duloxetine 60 mg PO every day - continue these for now Reviewed PHQ9 and GAD7- PHQ is significantly elevated today but she denies active SI right now Recommend she wait until she is completely off Latuda before we try changing Lyrica  Follow up in 6 weeks or sooner if concerns arise

## 2023-05-18 NOTE — Assessment & Plan Note (Signed)
Chronic, historic condition Appears well-managed today with current regimen comprised of lisinopril 20 mg p.o. daily, amlodipine 5 mg p.o. daily, atenolol 50 mg p.o. daily Continue current regimen Follow-up in 3 months or sooner if concerns arise

## 2023-05-18 NOTE — Assessment & Plan Note (Signed)
Chronic, start condition She is currently estimated to be in stage IIIb CKD and is followed by nephrology Her most recent appointment was in April 2024 Reviewed most recent notes-she is advised to avoid nephrotoxic, nephro taxing medications, encouraged to keep diabetes well-managed and stay well-hydrated Nephrology also advised appropriate dietary management Will check CMP at this time.  Continue collaboration with nephrology Follow-up 3 months or sooner if concerns arise

## 2023-05-22 NOTE — Progress Notes (Deleted)
Cardiology Office Note    Date:  05/22/2023   ID:  Angela Mullen, Angela Mullen November 02, 1955, MRN [id number]  PCP:  Larae Grooms, NP  Cardiologist:  Julien Nordmann, MD  Electrophysiologist:  None   Chief Complaint: ***  History of Present Illness:   Angela Mullen is a 67 y.o. female with history of ***  ***   Labs independently reviewed: 04/2023 - TC 142, TG 70, HDL 64, LDL 64, A1c 5.5, Hgb 12.1, PLT 153, BUN 26, serum creatinine 1.8, potassium 4.5, albumin 4.0, AST/ALT normal 11/2022 - TSH normal  Past Medical History:  Diagnosis Date   Abnormal liver enzymes 09/03/2014   Acid reflux    Allergy    Anemia    H/O   Anxiety    Arthritis    Cataract    COPD (chronic obstructive pulmonary disease) (HCC)    Depression    Diabetes mellitus without complication (HCC)    Dyspnea    WITH EXERTION DUE TO COPD   Elevation of level of transaminase or lactic acid dehydrogenase (LDH) 08/27/2012   Fatty liver    Headache    H/O MIGRAINES   High blood pressure    High cholesterol    Sleep apnea    BIPAP    Past Surgical History:  Procedure Laterality Date   BARIATRIC SURGERY  06/15/2017   CARDIAC CATHETERIZATION     COLONOSCOPY WITH PROPOFOL N/A 10/10/2018   Procedure: COLONOSCOPY WITH PROPOFOL;  Surgeon: Wyline Mood, MD;  Location: [facility] ENDOSCOPY;  Service: Gastroenterology;  Laterality: N/A;   FRACTURE SURGERY  1990   RIGHT LEG    KNEE SURGERY     REPLACEMENT TOTAL KNEE Left    TONSILLECTOMY     TOTAL ABDOMINAL HYSTERECTOMY W/ BILATERAL SALPINGOOPHORECTOMY     TOTAL KNEE ARTHROPLASTY Left 04/28/2020   Procedure: TOTAL KNEE ARTHROPLASTY;  Surgeon: Lyndle Herrlich, MD;  Location: [facility] ORS;  Service: Orthopedics;  Laterality: Left;   TUBAL LIGATION      Current Medications: No outpatient medications have been marked as taking for the 05/25/23 encounter (Appointment) with Sondra Barges, PA-C.    Allergies:   Ketorolac, Prednisone, and Nsaids   Social History    Socioeconomic History   Marital status: Married    Spouse name: Not on file   Number of children: Not on file   Years of education: Not on file   Highest education level: Not on file  Occupational History   Not on file  Tobacco Use   Smoking status: Never   Smokeless tobacco: Never  Vaping Use   Vaping status: Never Used  Substance and Sexual Activity   Alcohol use: Yes    Comment: occassional   Drug use: No   Sexual activity: Not Currently  Other Topics Concern   Not on file  Social History Narrative   Not on file   Social Determinants of Health   Financial Resource Strain: Not on file  Food Insecurity: Not on file  Transportation Needs: Not on file  Physical Activity: Not on file  Stress: Not on file  Social Connections: Not on file     Family History:  The patient's family history includes Cerebral aneurysm in her mother; Depression in her sister; Diabetes in her brother, father, and paternal grandfather; Heart attack in her maternal grandfather and sister; Heart disease in her paternal grandfather; High blood pressure in her brother; Hyperlipidemia in her paternal grandfather and son; Hypertension in her sister; Kidney disease in  her sister; Obesity in her sister; Stroke in her father, maternal grandmother, and sister.  ROS:   12-point review of systems is negative unless otherwise noted in the HPI.   EKGs/Labs/Other Studies Reviewed:    Studies reviewed were summarized above. The additional studies were reviewed today:  2D echo 10/18/2020: 1. Left ventricular ejection fraction, by estimation, is 55 to 60%. The  left ventricle has normal function. The left ventricle has no regional  wall motion abnormalities. Left ventricular diastolic parameters are  consistent with Grade I diastolic  dysfunction (impaired relaxation). The average left ventricular global  longitudinal strain is -19.7 %. The global longitudinal strain is normal.   2. Right ventricular  systolic function is normal. The right ventricular  size is normal. There is normal pulmonary artery systolic pressure.   3. Left atrial size was mildly dilated.   4. The mitral valve is normal in structure. No evidence of mitral valve  regurgitation. No evidence of mitral stenosis.   5. The aortic valve has an indeterminant number of cusps but likely  tricuspid. Aortic valve regurgitation is mild. Mild to moderate aortic  valve sclerosis/calcification is present, without any evidence of aortic  stenosis.   6. The inferior vena cava is normal in size with greater than 50%  respiratory variability, suggesting right atrial pressure of 3 mmHg.   Comparison(s): Prior Echo (Care Everywhere) showed LV EF 55%, normal LV  and RV function, mild AI, mild AS (mean gradient 13 mmHg).  __________  2D echo 04/18/2017 ([facility]): NORMAL LEFT VENTRICULAR SYSTOLIC FUNCTION WITH MILD LVH    NORMAL LA PRESSURES WITH NORMAL DIASTOLIC FUNCTION    NORMAL RIGHT VENTRICULAR SYSTOLIC FUNCTION    VALVULAR REGURGITATION: MILD AR, TRIVIAL PR, TRIVIAL TR    VALVULAR STENOSIS: MILD AS    MILD AS.    NO PRIOR STUDY FOR COMPARISON    EKG:  EKG is ordered today.  The EKG ordered today demonstrates ***  Recent Labs: 12/11/2022: TSH 1.470 05/16/2023: ALT 32; BUN 26; Creatinine, Ser 1.80; Hemoglobin 12.1; Platelets 153; Potassium 4.5; Sodium 144  Recent Lipid Panel    Component Value Date/Time   CHOL 142 05/16/2023 0929   TRIG 70 05/16/2023 0929   HDL 64 05/16/2023 0929   CHOLHDL 2.2 05/16/2023 0929   LDLCALC 64 05/16/2023 0929    PHYSICAL EXAM:    VS:  There were no vitals taken for this visit.  BMI: There is no height or weight on file to calculate BMI.  Physical Exam  Wt Readings from Last 3 Encounters:  05/16/23 255 lb 6.4 oz (115.8 kg)  12/11/22 238 lb 9.6 oz (108.2 kg)  09/11/22 225 lb 14.4 oz (102.5 kg)     ASSESSMENT & PLAN:   ***   {Are you ordering a CV Procedure (e.g. stress test, cath,  DCCV, TEE, etc)?   Press F2        :[id number]}     Disposition: F/u with Dr. Mariah Milling or an APP in ***.   Medication Adjustments/Labs and Tests Ordered: Current medicines are reviewed at length with the patient today.  Concerns regarding medicines are outlined above. Medication changes, Labs and Tests ordered today are summarized above and listed in the Patient Instructions accessible in Encounters.   Signed, Eula Listen, PA-C 05/22/2023 10:33 AM     [facility] [place] [address] [place], [place] [address] [phone]

## 2023-05-24 ENCOUNTER — Other Ambulatory Visit: Payer: Self-pay | Admitting: Cardiovascular Disease

## 2023-05-24 DIAGNOSIS — E782 Mixed hyperlipidemia: Secondary | ICD-10-CM

## 2023-05-24 NOTE — Telephone Encounter (Signed)
Patient was due yearly f/u in 01/2023.  Multiple appts have been made but she has been cancelling and r/s.  Now r/s to see MD in 06/2023.  OK to provide refills with multiple cancellations?

## 2023-05-25 ENCOUNTER — Ambulatory Visit: Payer: Medicare Other | Admitting: Physician Assistant

## 2023-05-27 DIAGNOSIS — E119 Type 2 diabetes mellitus without complications: Secondary | ICD-10-CM | POA: Diagnosis not present

## 2023-06-04 ENCOUNTER — Telehealth: Payer: Self-pay | Admitting: Nurse Practitioner

## 2023-06-04 NOTE — Telephone Encounter (Signed)
Copied from CRM [id number]. Topic: General - Inquiry >> Jun 04, 2023  2:25 PM Runell Gess P wrote: Reason for CRM: [facility] with [facility] called saying they have an approval on the Rexuti  .  She will be also faxing the approval.

## 2023-06-05 ENCOUNTER — Telehealth: Payer: Self-pay | Admitting: Cardiovascular Disease

## 2023-06-05 DIAGNOSIS — E782 Mixed hyperlipidemia: Secondary | ICD-10-CM

## 2023-06-05 MED ORDER — LOVASTATIN 40 MG PO TABS
40.0000 mg | ORAL_TABLET | Freq: Every day | ORAL | 0 refills | Status: DC
Start: 2023-06-05 — End: 2023-06-08

## 2023-06-05 NOTE — Telephone Encounter (Signed)
*  STAT* If patient is at the pharmacy, call can be transferred to refill team.   1. Which medications need to be refilled? (please list name of each medication and dose if known) lovastatin (MEVACOR) 40 MG tablet    4. Which pharmacy/location (including street and city if local pharmacy) is medication to be sent to? [id number] - [place], [place] - [address] [place] [address]     5. Do they need a 30 day or 90 day supply? 30

## 2023-06-05 NOTE — Telephone Encounter (Signed)
Requested Prescriptions   Signed Prescriptions Disp Refills   lovastatin (MEVACOR) 40 MG tablet 30 tablet 0    Sig: Take 1 tablet (40 mg total) by mouth at bedtime. Please keep appointment on 06/2023 for further refills. Thank you.    Authorizing Provider: Antonieta Iba    Ordering User: Guerry Minors

## 2023-06-07 ENCOUNTER — Other Ambulatory Visit: Payer: Medicare Other

## 2023-06-07 ENCOUNTER — Inpatient Hospital Stay: Admission: RE | Admit: 2023-06-07 | Payer: Medicare Other | Source: Ambulatory Visit

## 2023-06-08 ENCOUNTER — Other Ambulatory Visit: Payer: Self-pay

## 2023-06-08 DIAGNOSIS — E782 Mixed hyperlipidemia: Secondary | ICD-10-CM

## 2023-06-08 MED ORDER — LOVASTATIN 40 MG PO TABS
40.0000 mg | ORAL_TABLET | Freq: Every day | ORAL | 0 refills | Status: DC
Start: 2023-06-08 — End: 2023-08-06

## 2023-06-11 ENCOUNTER — Ambulatory Visit: Payer: Medicare Other | Admitting: Dietician

## 2023-06-26 ENCOUNTER — Other Ambulatory Visit: Payer: Self-pay | Admitting: Family Medicine

## 2023-06-27 ENCOUNTER — Inpatient Hospital Stay: Admission: RE | Admit: 2023-06-27 | Payer: Medicare Other | Source: Ambulatory Visit

## 2023-06-27 ENCOUNTER — Other Ambulatory Visit: Payer: Medicare Other

## 2023-06-27 ENCOUNTER — Other Ambulatory Visit: Payer: Self-pay | Admitting: Cardiovascular Disease

## 2023-06-27 NOTE — Telephone Encounter (Signed)
Requested medication (s) are due for refill today:yes  Requested medication (s) are on the active medication list: yes  Last refill:  04/20/23 #30 mg   Future visit scheduled: yes  Notes to clinic:  med not assigned to a protocol   Requested Prescriptions  Pending Prescriptions Disp Refills   clotrimazole-betamethasone (LOTRISONE) cream [Pharmacy Med Name: Clotrimazole-Betamethasone 1-0.05 % External Cream] 30 g 0    Sig: APPLY TOPICALLY TWICE DAILY AS NEEDED     Off-Protocol Failed - 06/26/2023  1:55 PM      Failed - Medication not assigned to a protocol, review manually.      Passed - Valid encounter within last 12 months    Recent Outpatient Visits           1 month ago DM (diabetes mellitus), type 2 with complications (HCC)   [place] [facility] Mecum, Oswaldo Conroy, PA-C   6 months ago [facility] [employer] preventive visit   [place] [facility] [place], Clydie Braun, NP   9 months ago Chronic obstructive pulmonary disease, unspecified COPD type [facility])   [place] [facility] Larae Grooms, NP   1 year ago Essential (primary) hypertension   [place] [facility] Larae Grooms, NP   1 year ago Essential (primary) hypertension   [place] [facility] Larae Grooms, NP       Future Appointments             In 3 weeks Larae Grooms, NP [facility]   In 1 month Dunn, Raymon Mutton, PA-C [facility]

## 2023-07-02 ENCOUNTER — Ambulatory Visit: Payer: Medicare Other | Admitting: Physician Assistant

## 2023-07-05 ENCOUNTER — Telehealth: Payer: Self-pay | Admitting: Student-PharmD

## 2023-07-05 NOTE — Progress Notes (Signed)
This patient is appearing on a report for being at risk of failing the adherence measure for cholesterol (statin) medications this calendar year.   Medication: Lovastatin 40mg  Last fill date: 06/05/2023 for 30 day supply  Left voicemail for patient to return my call at their convenience.   Sharee Pimple   07/09/23 @ 2:45pm: Update: Patient picked up Rx on 06/27/23 but wasn't showing in chart on 10/10. Now know to look at [facility] site. Sharee Pimple

## 2023-07-10 ENCOUNTER — Inpatient Hospital Stay: Admission: RE | Admit: 2023-07-10 | Payer: Medicare Other | Source: Ambulatory Visit

## 2023-07-10 ENCOUNTER — Other Ambulatory Visit: Payer: Medicare Other

## 2023-07-11 ENCOUNTER — Ambulatory Visit: Payer: Medicare Other | Admitting: Nurse Practitioner

## 2023-07-19 ENCOUNTER — Ambulatory Visit: Payer: Medicare Other | Admitting: Nurse Practitioner

## 2023-07-27 NOTE — Progress Notes (Deleted)
Cardiology Office Note    Date:  07/27/2023   ID:  Angela Mullen, DOB [birth year], MRN [id number]  PCP:  Larae Grooms, NP  Cardiologist:  Julien Nordmann, MD  Electrophysiologist:  None   Chief Complaint: Follow-up  History of Present Illness:   Angela Mullen is a 67 y.o. female with history of normal coronary arteries by prior documentation by LHC at [facility] in 06/2014, DM2, HTN, HLD, obesity status post gastric bypass surgery, CKD stage III, fatty liver, and COPD with prior remote tobacco use who presents for follow-up of ***.  Note indicates she underwent LHC in 06/2014 revealing normal coronary anatomy with normal LV systolic function at [facility].  Report is unavailable for review in Care Everywhere.  Echo performed at [facility] in 03/2017 showed an EF of greater than 55%, normal wall motion, mild concentric LVH, normal LV diastolic function parameters, mildly enlarged left atrium, mild aortic stenosis, mild aortic insufficiency, and trivial tricuspid regurgitation.  Echo from 09/2020 showed an EF of 55 to 60%, no regional wall motion abnormalities, grade 1 diastolic dysfunction, normal RV systolic function, ventricular cavity size, and RVSP, mildly dilated left atrium, mild to moderate aortic valve sclerosis without evidence of stenosis, and an estimated right atrial pressure of 3 mmHg.  Has previously been evaluated for orthostatic hypotension in the setting of weight loss.  Most recently seen in the office in 01/2022 and was without symptoms of angina or cardiac decompensation.  ***   Labs independently reviewed: 04/2023 - TC 142, TG 70, HDL 64, LDL 64, A1c 5.5, Hgb 12.1, PLT 153, BUN 26, serum creatinine 1.8, potassium 4.5, albumin 4.0, AST/ALT normal 11/2022 - TSH normal  Past Medical History:  Diagnosis Date   Abnormal liver enzymes 09/03/2014   Acid reflux    Allergy    Anemia    H/O   Anxiety    Arthritis    Cataract    COPD (chronic obstructive pulmonary disease) (HCC)     Depression    Diabetes mellitus without complication (HCC)    Dyspnea    WITH EXERTION DUE TO COPD   Elevation of level of transaminase or lactic acid dehydrogenase (LDH) 08/27/2012   Fatty liver    Headache    H/O MIGRAINES   High blood pressure    High cholesterol    Sleep apnea    BIPAP    Past Surgical History:  Procedure Laterality Date   BARIATRIC SURGERY  06/15/2017   CARDIAC CATHETERIZATION     COLONOSCOPY WITH PROPOFOL N/A 10/10/2018   Procedure: COLONOSCOPY WITH PROPOFOL;  Surgeon: Wyline Mood, MD;  Location: [facility] ENDOSCOPY;  Service: Gastroenterology;  Laterality: N/A;   FRACTURE SURGERY  1990   RIGHT LEG    KNEE SURGERY     REPLACEMENT TOTAL KNEE Left    TONSILLECTOMY     TOTAL ABDOMINAL HYSTERECTOMY W/ BILATERAL SALPINGOOPHORECTOMY     TOTAL KNEE ARTHROPLASTY Left 04/28/2020   Procedure: TOTAL KNEE ARTHROPLASTY;  Surgeon: Lyndle Herrlich, MD;  Location: [facility] ORS;  Service: Orthopedics;  Laterality: Left;   TUBAL LIGATION      Current Medications: No outpatient medications have been marked as taking for the 07/31/23 encounter (Appointment) with Sondra Barges, PA-C.    Allergies:   Ketorolac, Prednisone, and Nsaids   Social History   Socioeconomic History   Marital status: Married    Spouse name: Not on file   Number of children: Not on file   Years of education: Not on  file   Highest education level: Not on file  Occupational History   Not on file  Tobacco Use   Smoking status: Never   Smokeless tobacco: Never  Vaping Use   Vaping status: Never Used  Substance and Sexual Activity   Alcohol use: Yes    Comment: occassional   Drug use: No   Sexual activity: Not Currently  Other Topics Concern   Not on file  Social History Narrative   Not on file   Social Determinants of Health   Financial Resource Strain: Not on file  Food Insecurity: Not on file  Transportation Needs: Not on file  Physical Activity: Not on file  Stress: Not on file  Social  Connections: Not on file     Family History:  The patient's family history includes Cerebral aneurysm in her mother; Depression in her sister; Diabetes in her brother, father, and paternal grandfather; Heart attack in her maternal grandfather and sister; Heart disease in her paternal grandfather; High blood pressure in her brother; Hyperlipidemia in her paternal grandfather and son; Hypertension in her sister; Kidney disease in her sister; Obesity in her sister; Stroke in her father, maternal grandmother, and sister.  ROS:   12-point review of systems is negative unless otherwise noted in the HPI.   EKGs/Labs/Other Studies Reviewed:    Studies reviewed were summarized above. The additional studies were reviewed today:  2D echo 10/18/2020: 1. Left ventricular ejection fraction, by estimation, is 55 to 60%. The  left ventricle has normal function. The left ventricle has no regional  wall motion abnormalities. Left ventricular diastolic parameters are  consistent with Grade I diastolic  dysfunction (impaired relaxation). The average left ventricular global  longitudinal strain is -19.7 %. The global longitudinal strain is normal.   2. Right ventricular systolic function is normal. The right ventricular  size is normal. There is normal pulmonary artery systolic pressure.   3. Left atrial size was mildly dilated.   4. The mitral valve is normal in structure. No evidence of mitral valve  regurgitation. No evidence of mitral stenosis.   5. The aortic valve has an indeterminant number of cusps but likely  tricuspid. Aortic valve regurgitation is mild. Mild to moderate aortic  valve sclerosis/calcification is present, without any evidence of aortic  stenosis.   6. The inferior vena cava is normal in size with greater than 50%  respiratory variability, suggesting right atrial pressure of 3 mmHg.   Comparison(s): Prior Echo (Care Everywhere) showed LV EF 55%, normal LV  and RV function, mild  AI, mild AS (mean gradient 13 mmHg).  __________  2D echo 04/18/2017 ([facility]): NORMAL LEFT VENTRICULAR SYSTOLIC FUNCTION WITH MILD LVH    NORMAL LA PRESSURES WITH NORMAL DIASTOLIC FUNCTION    NORMAL RIGHT VENTRICULAR SYSTOLIC FUNCTION    VALVULAR REGURGITATION: MILD AR, TRIVIAL PR, TRIVIAL TR    VALVULAR STENOSIS: MILD AS    MILD AS.    NO PRIOR STUDY FOR COMPARISON     EKG:  EKG is ordered today.  The EKG ordered today demonstrates ***  Recent Labs: 12/11/2022: TSH 1.470 05/16/2023: ALT 32; BUN 26; Creatinine, Ser 1.80; Hemoglobin 12.1; Platelets 153; Potassium 4.5; Sodium 144  Recent Lipid Panel    Component Value Date/Time   CHOL 142 05/16/2023 0929   TRIG 70 05/16/2023 0929   HDL 64 05/16/2023 0929   CHOLHDL 2.2 05/16/2023 0929   LDLCALC 64 05/16/2023 0929    PHYSICAL EXAM:    VS:  There were  no vitals taken for this visit.  BMI: There is no height or weight on file to calculate BMI.  Physical Exam  Wt Readings from Last 3 Encounters:  05/16/23 255 lb 6.4 oz (115.8 kg)  12/11/22 238 lb 9.6 oz (108.2 kg)  09/11/22 225 lb 14.4 oz (102.5 kg)     ASSESSMENT & PLAN:   ***  HTN: Blood pressure  HLD: LDL 64 in 04/2023 with normal AST/ALT at that time.  CKD stage III:   {Are you ordering a CV Procedure (e.g. stress test, cath, DCCV, TEE, etc)?   Press F2        :[id number]}     Disposition: F/u with Dr. Mariah Milling or an APP in ***.   Medication Adjustments/Labs and Tests Ordered: Current medicines are reviewed at length with the patient today.  Concerns regarding medicines are outlined above. Medication changes, Labs and Tests ordered today are summarized above and listed in the Patient Instructions accessible in Encounters.   Signed, Eula Listen, PA-C 07/27/2023 12:11 PM     [facility] - [place] [address] [place], [place] [address] [phone]

## 2023-07-30 ENCOUNTER — Ambulatory Visit: Payer: Medicare Other | Admitting: Dietician

## 2023-07-31 ENCOUNTER — Ambulatory Visit: Payer: Medicare Other | Admitting: Physician Assistant

## 2023-07-31 ENCOUNTER — Other Ambulatory Visit: Payer: Medicare Other

## 2023-07-31 ENCOUNTER — Ambulatory Visit: Payer: Medicare Other

## 2023-08-01 ENCOUNTER — Other Ambulatory Visit: Payer: Medicare Other

## 2023-08-01 ENCOUNTER — Ambulatory Visit: Payer: Medicare Other | Admitting: Nurse Practitioner

## 2023-08-01 ENCOUNTER — Inpatient Hospital Stay: Admission: RE | Admit: 2023-08-01 | Payer: Medicare Other | Source: Ambulatory Visit

## 2023-08-02 ENCOUNTER — Other Ambulatory Visit: Payer: Self-pay | Admitting: Cardiovascular Disease

## 2023-08-02 DIAGNOSIS — I10 Essential (primary) hypertension: Secondary | ICD-10-CM

## 2023-08-06 ENCOUNTER — Other Ambulatory Visit: Payer: Self-pay | Admitting: Cardiovascular Disease

## 2023-08-06 DIAGNOSIS — E782 Mixed hyperlipidemia: Secondary | ICD-10-CM

## 2023-08-14 ENCOUNTER — Encounter: Payer: Self-pay | Admitting: Nurse Practitioner

## 2023-08-14 ENCOUNTER — Ambulatory Visit: Payer: Medicare Other | Admitting: Nurse Practitioner

## 2023-08-14 VITALS — BP 156/75 | HR 56 | Temp 97.9°F | Ht 65.0 in | Wt 253.4 lb

## 2023-08-14 DIAGNOSIS — F339 Major depressive disorder, recurrent, unspecified: Secondary | ICD-10-CM

## 2023-08-14 DIAGNOSIS — J449 Chronic obstructive pulmonary disease, unspecified: Secondary | ICD-10-CM | POA: Diagnosis not present

## 2023-08-14 DIAGNOSIS — E785 Hyperlipidemia, unspecified: Secondary | ICD-10-CM

## 2023-08-14 DIAGNOSIS — F419 Anxiety disorder, unspecified: Secondary | ICD-10-CM | POA: Diagnosis not present

## 2023-08-14 DIAGNOSIS — Z7985 Long-term (current) use of injectable non-insulin antidiabetic drugs: Secondary | ICD-10-CM

## 2023-08-14 DIAGNOSIS — N1832 Chronic kidney disease, stage 3b: Secondary | ICD-10-CM | POA: Diagnosis not present

## 2023-08-14 DIAGNOSIS — I10 Essential (primary) hypertension: Secondary | ICD-10-CM

## 2023-08-14 DIAGNOSIS — Z23 Encounter for immunization: Secondary | ICD-10-CM | POA: Diagnosis not present

## 2023-08-14 DIAGNOSIS — E118 Type 2 diabetes mellitus with unspecified complications: Secondary | ICD-10-CM

## 2023-08-14 DIAGNOSIS — D692 Other nonthrombocytopenic purpura: Secondary | ICD-10-CM

## 2023-08-14 DIAGNOSIS — E1169 Type 2 diabetes mellitus with other specified complication: Secondary | ICD-10-CM | POA: Diagnosis not present

## 2023-08-14 LAB — MICROALBUMIN, URINE WAIVED
Creatinine, Urine Waived: 200 mg/dL (ref 10–300)
Microalb, Ur Waived: 150 mg/L — ABNORMAL HIGH (ref 0–19)

## 2023-08-14 NOTE — Assessment & Plan Note (Addendum)
Chronic, Elevated at visit today but patient is worked up.  Will recheck at next visit and adjust medications at that time if needed. Appears well-managed today with current regimen comprised of lisinopril 20 mg p.o. daily, amlodipine 5 mg p.o. daily, atenolol 50 mg p.o. daily Continue current regimen Follow-up in 6 weeks or sooner if concerns arise

## 2023-08-14 NOTE — Assessment & Plan Note (Signed)
Recommended eating smaller high protein, low fat meals more frequently and exercising 30 mins a day 5 times a week with a goal of 10-15lb weight loss in the next 3 months.  

## 2023-08-14 NOTE — Assessment & Plan Note (Signed)
Chronic.  Seems to be doing well with anxiety however, depression is not well controlled.  Patient has been on Wellbutrin 300 and Cymbalta 90mg  and does not feel like they are helping.  Having feelings of anhedonia.  Has tried Jordan and abilify without significant relief of symptoms.  Has been on Rexulti in the past which is improved her mood better than any other medications she has tried.  Will restart Rexulti which would be a continuation of medication due to patient having been on it prior.  Follow up in 6 weeks.

## 2023-08-14 NOTE — Assessment & Plan Note (Signed)
Chronic.  Controlled.  Continue with current medication regimen.  Labs ordered today.  Return to clinic in 3 months for reevaluation.  Call sooner if concerns arise.   

## 2023-08-14 NOTE — Progress Notes (Signed)
BP (!) 156/75 (BP Location: Left Arm, Patient Position: Sitting, Cuff Size: Large)   Pulse (!) 56   Temp 97.9 F (36.6 C) (Oral)   Ht 5\' 5"  (1.651 m)   Wt 253 lb 6.4 oz (114.9 kg)   SpO2 95%   BMI 42.17 kg/m    Subjective:    Patient ID: Angela Mullen, female    DOB: [birth year], 67 y.o.   MRN: [id number]  HPI: Angela Mullen is a 67 y.o. female  Chief Complaint  Patient presents with   6 WEEK FOLLOW UP   Depression   Medication Refill    LYRICA, CLOTRIMAZOLE CREAM, TRAZODONE, METHOCARBAMOL    Medication Management    Would like to discuss wellbutrin and cymbalta. Body is getting used to medication and has become ineffective.    ANXIETY/DEPRESSION Patient states she feels like her medication isn't working well for her.  States she isn't wanting to go anywhere or take a shower.  She doesn't have any motivation.   Denies SI. She has done well with Rexulti in the past but her insurance won't cover it.  She has tried abilify and latuda which have not worked for her in the past.  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 in [facility]  PHQ-9 Total Score 13         08/14/2023   10:55 AM 05/16/2023    8:56 AM 12/11/2022    9:12 AM 09/11/2022    9:34 AM  GAD 7 : Generalized Anxiety Score  Nervous, Anxious, on Edge 2 1 1 1   Control/stop worrying 1 2 1 1   Worry too much - different things 1 1 1 1   Trouble relaxing 1 3 1 1   Restless 1 1 0 1  Easily annoyed or irritable 2 1 1 1   Afraid - awful might happen 2 1 0 1  Total GAD 7 Score 10 10 5 7   Anxiety Difficulty  Somewhat difficult Somewhat difficult Somewhat difficult    CHRONIC KIDNEY DISEASE Patient has been seeing the nephrologist for CKD. They discussed weight loss options to help control diabetes and CKD. Patient has changed her diet and lost some weight. She has a follow up coming up with Dr. Cherylann Ratel CKD status: controlled Medications renally dose: yes Previous renal evaluation:  yes Pneumovax:  Up to Date Influenza Vaccine:  Up to Date   HYPERTENSION Hypertension status: controlled  Satisfied with current treatment? no Duration of hypertension: years BP monitoring frequency:  not checking BP range:  BP medication side effects:  no Medication compliance: excellent compliance Previous BP meds: atenolol, amlodipine, and lisinopril Aspirin: no Recurrent headaches: no Visual changes: no Palpitations: no Dyspnea: no Chest pain: no Lower extremity edema: no Dizzy/lightheaded: no  DIABETES She is on Ozempic and doing well with it.  Hypoglycemic episodes:no Polydipsia/polyuria: no Visual disturbance: no Chest pain: no Paresthesias: no Glucose Monitoring: no  Accucheck frequency: Not Checking  Fasting glucose:  Post prandial:  Evening:  Before meals: Taking Insulin?: no  Long acting insulin:  Short acting insulin: Blood Pressure Monitoring: not checking Retinal Examination: Up to date Foot Exam: Up to Date Diabetic Education: Not Completed Pneumovax: Up to Date Influenza: Up to Date Aspirin: no   Denies HA, CP, SOB, dizziness, palpitations, visual changes, and lower extremity swelling. Denies polyuria and polydipsia.  Relevant past medical, surgical, family and social history reviewed and updated as indicated. Interim medical history since our last visit reviewed. Allergies and medications reviewed and  updated.  Review of Systems  Eyes:  Negative for visual disturbance.  Respiratory:  Negative for cough, chest tightness and shortness of breath.   Cardiovascular:  Negative for chest pain, palpitations and leg swelling.  Endocrine: Negative for polydipsia and polyuria.  Neurological:  Negative for dizziness and headaches.  Psychiatric/Behavioral:  Positive for dysphoric mood. Negative for suicidal ideas. The patient is nervous/anxious.     Per HPI unless specifically indicated above     Objective:    BP (!) 156/75 (BP Location: Left Arm,  Patient Position: Sitting, Cuff Size: Large)   Pulse (!) 56   Temp 97.9 F (36.6 C) (Oral)   Ht 5\' 5"  (1.651 m)   Wt 253 lb 6.4 oz (114.9 kg)   SpO2 95%   BMI 42.17 kg/m   Wt Readings from Last 3 Encounters:  08/14/23 253 lb 6.4 oz (114.9 kg)  05/16/23 255 lb 6.4 oz (115.8 kg)  12/11/22 238 lb 9.6 oz (108.2 kg)    Physical Exam Vitals and nursing note reviewed.  Constitutional:      General: She is not in acute distress.    Appearance: Normal appearance. She is obese. She is not ill-appearing, toxic-appearing or diaphoretic.  HENT:     Head: Normocephalic.     Right Ear: External ear normal.     Left Ear: External ear normal.     Nose: Nose normal.     Mouth/Throat:     Mouth: Mucous membranes are moist.     Pharynx: Oropharynx is clear.  Eyes:     General:        Right eye: No discharge.        Left eye: No discharge.     Extraocular Movements: Extraocular movements intact.     Conjunctiva/sclera: Conjunctivae normal.     Pupils: Pupils are equal, round, and reactive to light.  Cardiovascular:     Rate and Rhythm: Normal rate and regular rhythm.     Heart sounds: No murmur heard. Pulmonary:     Effort: Pulmonary effort is normal. No respiratory distress.     Breath sounds: Normal breath sounds. No wheezing or rales.  Musculoskeletal:     Cervical back: Normal range of motion and neck supple.  Skin:    General: Skin is warm and dry.     Capillary Refill: Capillary refill takes less than 2 seconds.  Neurological:     General: No focal deficit present.     Mental Status: She is alert and oriented to person, place, and time. Mental status is at baseline.  Psychiatric:        Mood and Affect: Mood normal.        Behavior: Behavior normal.        Thought Content: Thought content normal.        Judgment: Judgment normal.     Results for orders placed or performed in visit on 05/16/23  Comp Met (CMET)  Result Value Ref Range   Glucose 84 70 - 99 mg/dL   BUN 26 8 -  27 mg/dL   Creatinine, Ser 1.61 (H) 0.57 - 1.00 mg/dL   eGFR 31 (L) >09 UE/AVW/0.98   BUN/Creatinine Ratio 14 12 - 28   Sodium 144 134 - 144 mmol/L   Potassium 4.5 3.5 - 5.2 mmol/L   Chloride 108 (H) 96 - 106 mmol/L   CO2 22 20 - 29 mmol/L   Calcium 8.8 8.7 - 10.3 mg/dL   Total Protein 6.1 6.0 -  8.5 g/dL   Albumin 4.0 3.9 - 4.9 g/dL   Globulin, Total 2.1 1.5 - 4.5 g/dL   Bilirubin Total 0.3 0.0 - 1.2 mg/dL   Alkaline Phosphatase 115 44 - 121 IU/L   AST 29 0 - 40 IU/L   ALT 32 0 - 32 IU/L  CBC w/Diff  Result Value Ref Range   WBC 6.0 3.4 - 10.8 x10E3/uL   RBC 3.95 3.77 - 5.28 x10E6/uL   Hemoglobin 12.1 11.1 - 15.9 g/dL   Hematocrit 93.2 35.5 - 46.6 %   MCV 94 79 - 97 fL   MCH 30.6 26.6 - 33.0 pg   MCHC 32.4 31.5 - 35.7 g/dL   RDW 73.2 20.2 - 54.2 %   Platelets 153 150 - 450 x10E3/uL   Neutrophils 61 Not Estab. %   Lymphs 15 Not Estab. %   Monocytes 6 Not Estab. %   Eos 16 Not Estab. %   Basos 2 Not Estab. %   Neutrophils Absolute 3.7 1.4 - 7.0 x10E3/uL   Lymphocytes Absolute 0.9 0.7 - 3.1 x10E3/uL   Monocytes Absolute 0.4 0.1 - 0.9 x10E3/uL   EOS (ABSOLUTE) 1.0 (H) 0.0 - 0.4 x10E3/uL   Basophils Absolute 0.1 0.0 - 0.2 x10E3/uL   Immature Granulocytes 0 Not Estab. %   Immature Grans (Abs) 0.0 0.0 - 0.1 x10E3/uL  HgB A1c  Result Value Ref Range   Hgb A1c MFr Bld 5.5 4.8 - 5.6 %   Est. average glucose Bld gHb Est-mCnc 111 mg/dL  Lipid Profile  Result Value Ref Range   Cholesterol, Total 142 100 - 199 mg/dL   Triglycerides 70 0 - 149 mg/dL   HDL 64 >70 mg/dL   VLDL Cholesterol Cal 14 5 - 40 mg/dL   LDL Chol Calc (NIH) 64 0 - 99 mg/dL   Chol/HDL Ratio 2.2 0.0 - 4.4 ratio      Assessment & Plan:   Problem List Items Addressed This Visit       Cardiovascular and Mediastinum   Essential (primary) hypertension    Chronic, Elevated at visit today but patient is worked up.  Will recheck at next visit and adjust medications at that time if needed. Appears well-managed  today with current regimen comprised of lisinopril 20 mg p.o. daily, amlodipine 5 mg p.o. daily, atenolol 50 mg p.o. daily Continue current regimen Follow-up in 6 weeks or sooner if concerns arise      Senile purpura (HCC)    Reassured patient.         Respiratory   Chronic obstructive pulmonary disease (HCC)    Chronic.  Controlled.  Continue with current medication regimen.  Labs ordered today.  Return to clinic in 3 months for reevaluation.  Call sooner if concerns arise.         Endocrine   Hyperlipidemia associated with type 2 diabetes mellitus (HCC)    Chronic, controlled.  Appears well-controlled on current regimen comprised of lovastatin 40 mg p.o. daily Recheck lipid panel today, results to dictate further management Continue current regimen for now Follow-up in 3 months or sooner if concerns arise      Relevant Orders   Lipid Profile   HgB A1c   Microalbumin, Urine Waived   DM (diabetes mellitus), type 2 with complications (HCC)    Chronic, historic condition Most recent A1c was 5.5%-recheck today-results to dictate further management Doing well with Ozempic.  Increased to 0.5mg  weekly to help with weight management.  Samples given due to  patient being in the donut hole.   Follow-up in 3 months or sooner if concerns arise        Genitourinary   Chronic kidney disease (CKD), stage III (moderate) (HCC) - Primary    Chronic, Controlled.  Continue with GLP1. She is currently estimated to be in stage IIIb CKD and is followed by nephrology Her most recent appointment was in April 2024 Reviewed most recent notes-she is advised to avoid nephrotoxic, nephro taxing medications, encouraged to keep diabetes well-managed and stay well-hydrated Nephrology also advised appropriate dietary management Will check CMP at this time.  Continue collaboration with nephrology Follow-up 3 months or sooner if concerns arise      Relevant Orders   Comp Met (CMET)     Other   Morbid  obesity with body mass index of 40.0-49.9 (HCC)    Recommended eating smaller high protein, low fat meals more frequently and exercising 30 mins a day 5 times a week with a goal of 10-15lb weight loss in the next 3 months.       Depression, recurrent (HCC)    Chronic.  Seems to be doing well with anxiety however, depression is not well controlled.  Patient has been on Wellbutrin 300 and Cymbalta 90mg  and does not feel like they are helping.  Having feelings of anhedonia.  Has tried Jordan and abilify without significant relief of symptoms.  Has been on Rexulti in the past which is improved her mood better than any other medications she has tried.  Will restart Rexulti which would be a continuation of medication due to patient having been on it prior.  Follow up in 6 weeks.       Anxiety    Chronic.  Seems to be doing well with anxiety however, depression is not well controlled.  Patient has been on Wellbutrin 300 and Cymbalta 90mg  and does not feel like they are helping.  Having feelings of anhedonia.  Has tried Jordan and abilify without significant relief of symptoms.  Has been on Rexulti in the past which is improved her mood better than any other medications she has tried.  Will restart Rexulti which would be a continuation of medication due to patient having been on it prior.  Follow up in 6 weeks.       Other Visit Diagnoses     Need for influenza vaccination       Relevant Orders   Flu Vaccine Trivalent High Dose (Fluad) (Completed)   Need for COVID-19 vaccine       Relevant Orders   Pfizer Comirnaty Covid -19 Vaccine 72yrs and older (Completed)        Follow up plan: Return in about 6 weeks (around 09/25/2023) for Depression/Anxiety FU.

## 2023-08-14 NOTE — Assessment & Plan Note (Signed)
Reassured patient. 

## 2023-08-14 NOTE — Assessment & Plan Note (Addendum)
Chronic, Controlled.  Continue with GLP1. She is currently estimated to be in stage IIIb CKD and is followed by nephrology Her most recent appointment was in April 2024 Reviewed most recent notes-she is advised to avoid nephrotoxic, nephro taxing medications, encouraged to keep diabetes well-managed and stay well-hydrated Nephrology also advised appropriate dietary management Will check CMP at this time.  Continue collaboration with nephrology Follow-up 3 months or sooner if concerns arise

## 2023-08-14 NOTE — Assessment & Plan Note (Signed)
Chronic, historic condition Most recent A1c was 5.5%-recheck today-results to dictate further management Doing well with Ozempic.  Increased to 0.5mg  weekly to help with weight management.  Samples given due to patient being in the donut hole.   Follow-up in 3 months or sooner if concerns arise

## 2023-08-14 NOTE — Assessment & Plan Note (Signed)
Chronic, controlled.  Appears well-controlled on current regimen comprised of lovastatin 40 mg p.o. daily Recheck lipid panel today, results to dictate further management Continue current regimen for now Follow-up in 3 months or sooner if concerns arise

## 2023-08-15 LAB — COMPREHENSIVE METABOLIC PANEL
ALT: 32 [IU]/L (ref 0–32)
AST: 26 [IU]/L (ref 0–40)
Albumin: 4.2 g/dL (ref 3.9–4.9)
Alkaline Phosphatase: 116 [IU]/L (ref 44–121)
BUN/Creatinine Ratio: 14 (ref 12–28)
BUN: 21 mg/dL (ref 8–27)
Bilirubin Total: 0.3 mg/dL (ref 0.0–1.2)
CO2: 22 mmol/L (ref 20–29)
Calcium: 9.3 mg/dL (ref 8.7–10.3)
Chloride: 109 mmol/L — ABNORMAL HIGH (ref 96–106)
Creatinine, Ser: 1.49 mg/dL — ABNORMAL HIGH (ref 0.57–1.00)
Globulin, Total: 1.9 g/dL (ref 1.5–4.5)
Glucose: 95 mg/dL (ref 70–99)
Potassium: 4.6 mmol/L (ref 3.5–5.2)
Sodium: 145 mmol/L — ABNORMAL HIGH (ref 134–144)
Total Protein: 6.1 g/dL (ref 6.0–8.5)
eGFR: 39 mL/min/{1.73_m2} — ABNORMAL LOW (ref >59)

## 2023-08-15 LAB — LIPID PANEL
Chol/HDL Ratio: 2.5 ratio (ref 0.0–4.4)
Cholesterol, Total: 165 mg/dL (ref 100–199)
HDL: 65 mg/dL (ref >39)
LDL Chol Calc (NIH): 85 mg/dL (ref 0–99)
Triglycerides: 80 mg/dL (ref 0–149)
VLDL Cholesterol Cal: 15 mg/dL (ref 5–40)

## 2023-08-15 LAB — HEMOGLOBIN A1C
Est. average glucose Bld gHb Est-mCnc: 114 mg/dL
Hgb A1c MFr Bld: 5.6 % (ref 4.8–5.6)

## 2023-08-17 DIAGNOSIS — G4733 Obstructive sleep apnea (adult) (pediatric): Secondary | ICD-10-CM | POA: Diagnosis not present

## 2023-08-21 ENCOUNTER — Inpatient Hospital Stay: Admission: RE | Admit: 2023-08-21 | Payer: Medicare Other | Source: Ambulatory Visit

## 2023-08-21 ENCOUNTER — Other Ambulatory Visit: Payer: Medicare Other

## 2023-08-26 ENCOUNTER — Encounter: Payer: Self-pay | Admitting: Nurse Practitioner

## 2023-08-26 DIAGNOSIS — Z1231 Encounter for screening mammogram for malignant neoplasm of breast: Secondary | ICD-10-CM

## 2023-08-27 ENCOUNTER — Ambulatory Visit: Payer: Medicare Other | Admitting: Dietician

## 2023-08-28 ENCOUNTER — Other Ambulatory Visit: Payer: Self-pay | Admitting: Nurse Practitioner

## 2023-08-28 MED ORDER — BREXPIPRAZOLE 0.25 MG PO TABS: 0.2500 mg | ORAL_TABLET | Freq: Every day | ORAL | 0 refills | Status: DC

## 2023-08-28 NOTE — Addendum Note (Signed)
Addended by: Larae Grooms on: 08/28/2023 04:28 PM   Modules accepted: Orders

## 2023-08-29 NOTE — Progress Notes (Unsigned)
Cardiology Office Note    Date:  08/29/2023   ID:  Angela Mullen, DOB [birth year], MRN [id number]  PCP:  Larae Grooms, NP  Cardiologist:  Julien Nordmann, MD  Electrophysiologist:  None   Chief Complaint: Follow-up  History of Present Illness:   Angela Mullen is a 67 y.o. female with history of normal coronary arteries by prior documentation by LHC at [facility] in 06/2014, DM2, HTN, HLD, obesity status post gastric bypass surgery, CKD stage III, fatty liver, and COPD with prior remote tobacco use who presents for follow-up of ***.  Note indicates she underwent LHC in 06/2014 revealing normal coronary anatomy with normal LV systolic function at [facility].  Report is unavailable for review in Care Everywhere.  Echo performed at [facility] in 03/2017 showed an EF of greater than 55%, normal wall motion, mild concentric LVH, normal LV diastolic function parameters, mildly enlarged left atrium, mild aortic stenosis, mild aortic insufficiency, and trivial tricuspid regurgitation.  Echo from 09/2020 showed an EF of 55 to 60%, no regional wall motion abnormalities, grade 1 diastolic dysfunction, normal RV systolic function, ventricular cavity size, and RVSP, mildly dilated left atrium, mild to moderate aortic valve sclerosis without evidence of stenosis, and an estimated right atrial pressure of 3 mmHg.  Has previously been evaluated for orthostatic hypotension in the setting of weight loss.  Most recently seen in the office in 01/2022 and was without symptoms of angina or cardiac decompensation.  ***   Labs independently reviewed: 04/2023 - TC 142, TG 70, HDL 64, LDL 64, A1c 5.5, Hgb 12.1, PLT 153, BUN 26, serum creatinine 1.8, potassium 4.5, albumin 4.0, AST/ALT normal 11/2022 - TSH normal  Past Medical History:  Diagnosis Date   Abnormal liver enzymes 09/03/2014   Acid reflux    Allergy    Anemia    H/O   Anxiety    Arthritis    Cataract    COPD (chronic obstructive pulmonary disease) (HCC)     Depression    Diabetes mellitus without complication (HCC)    Dyspnea    WITH EXERTION DUE TO COPD   Elevation of level of transaminase or lactic acid dehydrogenase (LDH) 08/27/2012   Fatty liver    Headache    H/O MIGRAINES   High blood pressure    High cholesterol    Sleep apnea    BIPAP    Past Surgical History:  Procedure Laterality Date   BARIATRIC SURGERY  06/15/2017   CARDIAC CATHETERIZATION     COLONOSCOPY WITH PROPOFOL N/A 10/10/2018   Procedure: COLONOSCOPY WITH PROPOFOL;  Surgeon: Wyline Mood, MD;  Location: [facility] ENDOSCOPY;  Service: Gastroenterology;  Laterality: N/A;   FRACTURE SURGERY  1990   RIGHT LEG    KNEE SURGERY     REPLACEMENT TOTAL KNEE Left    TONSILLECTOMY     TOTAL ABDOMINAL HYSTERECTOMY W/ BILATERAL SALPINGOOPHORECTOMY     TOTAL KNEE ARTHROPLASTY Left 04/28/2020   Procedure: TOTAL KNEE ARTHROPLASTY;  Surgeon: Lyndle Herrlich, MD;  Location: [facility] ORS;  Service: Orthopedics;  Laterality: Left;   TUBAL LIGATION      Current Medications: No outpatient medications have been marked as taking for the 08/30/23 encounter (Appointment) with Sondra Barges, PA-C.    Allergies:   Ketorolac, Prednisone, and Nsaids   Social History   Socioeconomic History   Marital status: Married    Spouse name: Not on file   Number of children: Not on file   Years of education: Not on  file   Highest education level: Not on file  Occupational History   Not on file  Tobacco Use   Smoking status: Never   Smokeless tobacco: Never  Vaping Use   Vaping status: Never Used  Substance and Sexual Activity   Alcohol use: Yes    Comment: occassional   Drug use: No   Sexual activity: Not Currently  Other Topics Concern   Not on file  Social History Narrative   Not on file   Social Determinants of Health   Financial Resource Strain: Not on file  Food Insecurity: Not on file  Transportation Needs: Not on file  Physical Activity: Not on file  Stress: Not on file  Social  Connections: Not on file     Family History:  The patient's family history includes Cerebral aneurysm in her mother; Depression in her sister; Diabetes in her brother, father, and paternal grandfather; Heart attack in her maternal grandfather and sister; Heart disease in her paternal grandfather; High blood pressure in her brother; Hyperlipidemia in her paternal grandfather and son; Hypertension in her sister; Kidney disease in her sister; Obesity in her sister; Stroke in her father, maternal grandmother, and sister.  ROS:   12-point review of systems is negative unless otherwise noted in the HPI.   EKGs/Labs/Other Studies Reviewed:    Studies reviewed were summarized above. The additional studies were reviewed today:  2D echo 10/18/2020: 1. Left ventricular ejection fraction, by estimation, is 55 to 60%. The  left ventricle has normal function. The left ventricle has no regional  wall motion abnormalities. Left ventricular diastolic parameters are  consistent with Grade I diastolic  dysfunction (impaired relaxation). The average left ventricular global  longitudinal strain is -19.7 %. The global longitudinal strain is normal.   2. Right ventricular systolic function is normal. The right ventricular  size is normal. There is normal pulmonary artery systolic pressure.   3. Left atrial size was mildly dilated.   4. The mitral valve is normal in structure. No evidence of mitral valve  regurgitation. No evidence of mitral stenosis.   5. The aortic valve has an indeterminant number of cusps but likely  tricuspid. Aortic valve regurgitation is mild. Mild to moderate aortic  valve sclerosis/calcification is present, without any evidence of aortic  stenosis.   6. The inferior vena cava is normal in size with greater than 50%  respiratory variability, suggesting right atrial pressure of 3 mmHg.   Comparison(s): Prior Echo (Care Everywhere) showed LV EF 55%, normal LV  and RV function, mild  AI, mild AS (mean gradient 13 mmHg).  __________  2D echo 04/18/2017 ([facility]): NORMAL LEFT VENTRICULAR SYSTOLIC FUNCTION WITH MILD LVH    NORMAL LA PRESSURES WITH NORMAL DIASTOLIC FUNCTION    NORMAL RIGHT VENTRICULAR SYSTOLIC FUNCTION    VALVULAR REGURGITATION: MILD AR, TRIVIAL PR, TRIVIAL TR    VALVULAR STENOSIS: MILD AS    MILD AS.    NO PRIOR STUDY FOR COMPARISON     EKG:  EKG is ordered today.  The EKG ordered today demonstrates ***  Recent Labs: 12/11/2022: TSH 1.470 05/16/2023: Hemoglobin 12.1; Platelets 153 08/14/2023: ALT 32; BUN 21; Creatinine, Ser 1.49; Potassium 4.6; Sodium 145  Recent Lipid Panel    Component Value Date/Time   CHOL 165 08/14/2023 1104   TRIG 80 08/14/2023 1104   HDL 65 08/14/2023 1104   CHOLHDL 2.5 08/14/2023 1104   LDLCALC 85 08/14/2023 1104    PHYSICAL EXAM:    VS:  There  were no vitals taken for this visit.  BMI: There is no height or weight on file to calculate BMI.  Physical Exam  Wt Readings from Last 3 Encounters:  08/14/23 253 lb 6.4 oz (114.9 kg)  05/16/23 255 lb 6.4 oz (115.8 kg)  12/11/22 238 lb 9.6 oz (108.2 kg)     ASSESSMENT & PLAN:   ***  HTN: Blood pressure  HLD: LDL 64 in 04/2023 with normal AST/ALT at that time.  CKD stage III:   {Are you ordering a CV Procedure (e.g. stress test, cath, DCCV, TEE, etc)?   Press F2        :[id number]}     Disposition: F/u with Dr. Mariah Milling or an APP in ***.   Medication Adjustments/Labs and Tests Ordered: Current medicines are reviewed at length with the patient today.  Concerns regarding medicines are outlined above. Medication changes, Labs and Tests ordered today are summarized above and listed in the Patient Instructions accessible in Encounters.   Signed, Eula Listen, PA-C 08/29/2023 11:52 AM     [facility] [place] [address] [place], [place] [address] [phone]

## 2023-08-30 ENCOUNTER — Ambulatory Visit: Payer: Medicare Other | Admitting: Physician Assistant

## 2023-08-30 NOTE — Telephone Encounter (Signed)
[facility] Medication Refill Request  Last non-acute appointment needs to be within 12 months YES  Last office visit at [facility]: 08/14/2023   Next office visit at [facility]: 09/25/2023

## 2023-08-30 NOTE — Telephone Encounter (Signed)
Requested medications are due for refill today.  yes  Requested medications are on the active medications list.  yes  Last refill. 02/02/2023 #180 01 rf  Future visit scheduled.   yes  Notes to clinic.  Refill not delegated.    Requested Prescriptions  Pending Prescriptions Disp Refills   pregabalin (LYRICA) 150 MG capsule [Pharmacy Med Name: Pregabalin 150 MG Oral Capsule] 180 capsule 0    Sig: Take 2 capsules by mouth once daily     Not Delegated - Neurology:  Anticonvulsants - Controlled - pregabalin Failed - 08/28/2023  8:05 PM      Failed - This refill cannot be delegated      Failed - Cr in normal range and within 360 days    Creatinine  Date Value Ref Range Status  05/11/2013 0.97 0.60 - 1.30 mg/dL Final   Creatinine, Ser  Date Value Ref Range Status  08/14/2023 1.49 (H) 0.57 - 1.00 mg/dL Final         Passed - Completed PHQ-2 or PHQ-9 in the last 360 days      Passed - Valid encounter within last 12 months    Recent Outpatient Visits           2 weeks ago Stage 3b chronic kidney disease (HCC)   [place] [facility] Larae Grooms, NP   3 months ago DM (diabetes mellitus), type 2 with complications (HCC)   [place] [facility] [place], Oswaldo Conroy, PA-C   8 months ago [employer] preventive visit   [place] [facility] [place], Clydie Braun, NP   11 months ago Chronic obstructive pulmonary disease, unspecified COPD type [facility])   [place] [facility] Larae Grooms, NP   1 year ago Essential (primary) hypertension   [place] [facility] Larae Grooms, NP       Future Appointments             In 3 weeks Larae Grooms, NP [place] [facility]   In 1 month Dunn, Raymon Mutton, PA-C [facility]

## 2023-09-03 ENCOUNTER — Other Ambulatory Visit: Payer: Self-pay | Admitting: Nurse Practitioner

## 2023-09-05 NOTE — Telephone Encounter (Signed)
Requested medication (s) are due for refill today: yes  Requested medication (s) are on the active medication list: yes    Last refill: 06/28/23  30g   0 refills  Future visit scheduled yes 09/25/23  Notes to clinic:  Off protocol, please review. Thank you.  Requested Prescriptions  Pending Prescriptions Disp Refills   clotrimazole-betamethasone (LOTRISONE) cream [Pharmacy Med Name: Clotrimazole-Betamethasone 1-0.05 % External Cream] 30 g 0    Sig: APPLY  CREAM TOPICALLY TWICE DAILY AS NEEDED     Off-Protocol Failed - 09/03/2023  5:09 PM      Failed - Medication not assigned to a protocol, review manually.      Passed - Valid encounter within last 12 months    Recent Outpatient Visits           3 weeks ago Stage 3b chronic kidney disease (HCC)   [place] [facility] Larae Grooms, NP   3 months ago DM (diabetes mellitus), type 2 with complications (HCC)   [place] [facility] Mecum, Oswaldo Conroy, PA-C   8 months ago [facility] [employer] preventive visit   [place] [facility] Larae Grooms, NP   11 months ago Chronic obstructive pulmonary disease, unspecified COPD type [facility])   [place] [facility] Larae Grooms, NP   1 year ago Essential (primary) hypertension   [place] [facility] Larae Grooms, NP       Future Appointments             In 2 weeks Larae Grooms, NP [facility]   In 1 month Dunn, Raymon Mutton, PA-C [facility]

## 2023-09-06 ENCOUNTER — Other Ambulatory Visit: Payer: Self-pay | Admitting: Cardiovascular Disease

## 2023-09-12 NOTE — Progress Notes (Signed)
   09/12/2023  Patient ID: Angela Mullen, female   DOB: Feb 15, 1956, 67 y.o.   MRN: [id number]  Pharmacy Quality Measure Review  This patient is appearing on a report for being at risk of failing the adherence measure for cholesterol (statin) medications this calendar year.   Medication: lovastatin 40mg  Last fill date: 08/06/2023 for 30 day supply  Will collaborate with provider to facilitate refill needs.  Prescription was sent 11/11 for 30 day supply stating patient must keep 12/5 appointment for refills, and it appears this was rescheduled to 1/14.  Contacting cardiology to see if they will approve refills to get patient until this visit.  [name], PharmD, DPLA

## 2023-09-16 ENCOUNTER — Other Ambulatory Visit: Payer: Self-pay | Admitting: Cardiovascular Disease

## 2023-09-16 DIAGNOSIS — E782 Mixed hyperlipidemia: Secondary | ICD-10-CM

## 2023-09-16 MED ORDER — LOVASTATIN 40 MG PO TABS: 40.0000 mg | ORAL_TABLET | Freq: Every day | ORAL | 1 refills | Status: DC

## 2023-09-25 ENCOUNTER — Ambulatory Visit: Payer: Medicare Other | Admitting: Nurse Practitioner

## 2023-10-03 ENCOUNTER — Ambulatory Visit: Payer: Medicare Other | Admitting: Nurse Practitioner

## 2023-10-04 ENCOUNTER — Other Ambulatory Visit: Payer: Self-pay | Admitting: *Deleted

## 2023-10-08 ENCOUNTER — Telehealth: Payer: Self-pay | Admitting: Cardiovascular Disease

## 2023-10-08 MED ORDER — ATENOLOL 50 MG PO TABS: ORAL_TABLET | ORAL | 0 refills | Status: DC

## 2023-10-08 NOTE — Telephone Encounter (Signed)
. *  STAT* If patient is at the pharmacy, call can be transferred to refill team.   1. Which medications need to be refilled? (please list name of each medication and dose if known) Atenolol    2. Would you like to learn more about the convenience, safety, & potential cost savings by using the [facility]?     3. Are you open to using the [facility] (Type [facility]..   4. Which pharmacy/location (including street and city if local pharmacy) is medication to be sent to? [facility] [address], [place]   5. Do they need a 30 day or 90 day supply? 30 days and refills

## 2023-10-08 NOTE — Telephone Encounter (Signed)
 Pt requesting additional refills. Pt hx of cancelling and scheduling refills. Pt had recent f/u scheduled for 10/09/2023 with Dunn, pt called and cancelled due to sickness. Pt now has f/u 1/29 with Dunn please advise for pt most recent refill expires today.

## 2023-10-08 NOTE — Telephone Encounter (Signed)
 Please see note Below for refill request.

## 2023-10-08 NOTE — Addendum Note (Signed)
 Addended by: Jani Gravel on: 10/08/2023 04:16 PM   Modules accepted: Orders

## 2023-10-09 ENCOUNTER — Ambulatory Visit: Payer: Self-pay | Admitting: Physician Assistant

## 2023-10-18 ENCOUNTER — Ambulatory Visit: Payer: Self-pay | Admitting: Nurse Practitioner

## 2023-10-19 ENCOUNTER — Other Ambulatory Visit: Payer: Self-pay | Admitting: Cardiovascular Disease

## 2023-10-23 ENCOUNTER — Other Ambulatory Visit: Payer: Self-pay | Admitting: Cardiovascular Disease

## 2023-10-23 MED ORDER — ATENOLOL 50 MG PO TABS: ORAL_TABLET | ORAL | 0 refills | Status: DC

## 2023-10-23 NOTE — Telephone Encounter (Signed)
*  STAT* If patient is at the pharmacy, call can be transferred to refill team.   1. Which medications need to be refilled? (please list name of each medication and dose if known) atenolol (TENORMIN) 50 MG tablet   2. Which pharmacy/location (including street and city if local pharmacy) is medication to be sent to? [id number] - [place], [place] - [address]   3. Do they need a 30 day or 90 day supply? 30

## 2023-10-24 ENCOUNTER — Ambulatory Visit: Payer: Self-pay | Admitting: Physician Assistant

## 2023-10-24 NOTE — Telephone Encounter (Signed)
Not ok to refill. Patient needs appointment.

## 2023-10-29 ENCOUNTER — Ambulatory Visit: Payer: PPO | Admitting: Nurse Practitioner

## 2023-10-29 VITALS — BP 132/80 | HR 60 | Ht 65.0 in | Wt 246.2 lb

## 2023-10-29 DIAGNOSIS — F339 Major depressive disorder, recurrent, unspecified: Secondary | ICD-10-CM

## 2023-10-29 MED ORDER — BREXPIPRAZOLE 0.25 MG PO TABS: 0.2500 mg | ORAL_TABLET | Freq: Every day | ORAL | 0 refills | Status: DC

## 2023-10-29 NOTE — Progress Notes (Signed)
BP 132/80 (BP Location: Left Arm, Patient Position: Sitting, Cuff Size: Large)   Pulse 60   Ht 5\' 5"  (1.651 m)   Wt 246 lb 3.2 oz (111.7 kg)   SpO2 99%   BMI 40.97 kg/m    Subjective:    Patient ID: Angela Mullen, female    DOB: 07-02-1956, 68 y.o.   MRN: [id number]  HPI: Angela Mullen is a 67 y.o. female  Chief Complaint  Patient presents with   Depression   Anxiety   Medication Management    Has new insurance and would like to retry rx for rexulti   Bleeding/Bruising    Bilateral arms   ANXIETY/DEPRESSION She has new insurance this year and would like to try rexulti again.    Patient states she feels like her medication isn't working well for her.  States she isn't wanting to go anywhere or take a shower.  She doesn't have any motivation.   Denies SI. She has done well with Rexulti in the past but her insurance won't cover it.  She has tried abilify and latuda which have not worked for her in the past.  Relevant past medical, surgical, family and social history reviewed and updated as indicated. Interim medical history since our last visit reviewed. Allergies and medications reviewed and updated.  Review of Systems  Psychiatric/Behavioral:  Positive for dysphoric mood. Negative for suicidal ideas. The patient is nervous/anxious.     Per HPI unless specifically indicated above     Objective:    BP 132/80 (BP Location: Left Arm, Patient Position: Sitting, Cuff Size: Large)   Pulse 60   Ht 5\' 5"  (1.651 m)   Wt 246 lb 3.2 oz (111.7 kg)   SpO2 99%   BMI 40.97 kg/m   Wt Readings from Last 3 Encounters:  10/29/23 246 lb 3.2 oz (111.7 kg)  08/14/23 253 lb 6.4 oz (114.9 kg)  05/16/23 255 lb 6.4 oz (115.8 kg)    Physical Exam Vitals and nursing note reviewed.  Constitutional:      General: She is not in acute distress.    Appearance: Normal appearance. She is normal weight. She is not ill-appearing, toxic-appearing or diaphoretic.  HENT:     Head:  Normocephalic.     Right Ear: External ear normal.     Left Ear: External ear normal.     Nose: Nose normal.     Mouth/Throat:     Mouth: Mucous membranes are moist.     Pharynx: Oropharynx is clear.  Eyes:     General:        Right eye: No discharge.        Left eye: No discharge.     Extraocular Movements: Extraocular movements intact.     Conjunctiva/sclera: Conjunctivae normal.     Pupils: Pupils are equal, round, and reactive to light.  Cardiovascular:     Rate and Rhythm: Normal rate and regular rhythm.     Heart sounds: No murmur heard. Pulmonary:     Effort: Pulmonary effort is normal. No respiratory distress.     Breath sounds: Normal breath sounds. No wheezing or rales.  Musculoskeletal:     Cervical back: Normal range of motion and neck supple.  Skin:    General: Skin is warm and dry.     Capillary Refill: Capillary refill takes less than 2 seconds.  Neurological:     General: No focal deficit present.     Mental Status: She is alert  and oriented to person, place, and time. Mental status is at baseline.  Psychiatric:        Mood and Affect: Mood normal.        Behavior: Behavior normal.        Thought Content: Thought content normal.        Judgment: Judgment normal.     Results for orders placed or performed in visit on 08/14/23  Microalbumin, Urine Waived   Collection Time: 08/14/23 11:03 AM  Result Value Ref Range   Microalb, Ur Waived 150 (H) 0 - 19 mg/L   Creatinine, Urine Waived 200 10 - 300 mg/dL   Microalb/Creat Ratio 30-300 (H) <30 mg/g  Comp Met (CMET)   Collection Time: 08/14/23 11:04 AM  Result Value Ref Range   Glucose 95 70 - 99 mg/dL   BUN 21 8 - 27 mg/dL   Creatinine, Ser 6.64 (H) 0.57 - 1.00 mg/dL   eGFR 39 (L) >40 HK/VQQ/5.95   BUN/Creatinine Ratio 14 12 - 28   Sodium 145 (H) 134 - 144 mmol/L   Potassium 4.6 3.5 - 5.2 mmol/L   Chloride 109 (H) 96 - 106 mmol/L   CO2 22 20 - 29 mmol/L   Calcium 9.3 8.7 - 10.3 mg/dL   Total Protein 6.1  6.0 - 8.5 g/dL   Albumin 4.2 3.9 - 4.9 g/dL   Globulin, Total 1.9 1.5 - 4.5 g/dL   Bilirubin Total 0.3 0.0 - 1.2 mg/dL   Alkaline Phosphatase 116 44 - 121 IU/L   AST 26 0 - 40 IU/L   ALT 32 0 - 32 IU/L  Lipid Profile   Collection Time: 08/14/23 11:04 AM  Result Value Ref Range   Cholesterol, Total 165 100 - 199 mg/dL   Triglycerides 80 0 - 149 mg/dL   HDL 65 >63 mg/dL   VLDL Cholesterol Cal 15 5 - 40 mg/dL   LDL Chol Calc (NIH) 85 0 - 99 mg/dL   Chol/HDL Ratio 2.5 0.0 - 4.4 ratio  HgB A1c   Collection Time: 08/14/23 11:04 AM  Result Value Ref Range   Hgb A1c MFr Bld 5.6 4.8 - 5.6 %   Est. average glucose Bld gHb Est-mCnc 114 mg/dL      Assessment & Plan:   Problem List Items Addressed This Visit       Other   Depression, recurrent (HCC) - Primary   Chronic.  Seems to be doing well with anxiety however, depression is not well controlled.  Patient has been on Wellbutrin 300 and Cymbalta 90mg  and does not feel like they are helping.  Having feelings of anhedonia.  Has tried Jordan and abilify without significant relief of symptoms.  Has been on Rexulti in the past which is improved her mood better than any other medications she has tried.  She has changed insurances since our last visit.  Would liek to see if  Rexulti will be covered.  Follow up in 2 months. Call sooner if concerns arise.         Follow up plan: Return in about 2 months (around 12/27/2023) for HTN, HLD, DM2 FU.

## 2023-10-29 NOTE — Progress Notes (Deleted)
 Cardiology Office Note    Date:  10/29/2023   ID:  Kaylor, Maiers 04/29/1956, MRN [id number]  PCP:  Melvin Pao, NP  Cardiologist:  Evalene Lunger, MD  Electrophysiologist:  None   Chief Complaint: Follow-up  History of Present Illness:   Angela Mullen is a 68 y.o. female with history of HTN, HLD, DM2, CKD stage III, COPD with remote smoking history, obesity status post gastric bypass, OSA, and GERD who presents for ***  She was previously followed by Dr. Ammon, establishing her care with Dr. Gollan in 2019.  LHC in 2015 showed normal coronary anatomy and normal LV systolic function.  Echo from 2018 showed an EF of greater than 55%, normal wall motion, mild concentric LVH, normal diastolic function, normal RV systolic function, and mild aortic insufficiency and aortic stenosis.  Echo from 09/2020 showed an EF of 55 to 60%, no regional wall motion normalities, grade 1 diastolic dysfunction, normal RV systolic function and ventricular cavity size, normal RVSP, mildly dilated left atrium, indeterminate number of aortic valve cusps with mild insufficiency and mild to moderate sclerosis without evidence of aortic stenosis.  She was most recently seen in the office in 01/2022 and was without symptoms of angina or cardiac decompensation.  ***   Labs independently reviewed: 07/2023 - A1c 5.6, TC 165, TG 80, HDL 65, LDL 85, BUN 21, serum creatinine 1.49, potassium 4.6, albumin 4.2, AST/ALT normal 04/2023 - Hgb 12.1, PLT 153 11/2022 - TSH normal  Past Medical History:  Diagnosis Date   Abnormal liver enzymes 09/03/2014   Acid reflux    Allergy    Anemia    H/O   Anxiety    Arthritis    Cataract    COPD (chronic obstructive pulmonary disease) (HCC)    Depression    Diabetes mellitus without complication (HCC)    Dyspnea    WITH EXERTION DUE TO COPD   Elevation of level of transaminase or lactic acid dehydrogenase (LDH) 08/27/2012   Fatty liver    Headache    H/O  MIGRAINES   High blood pressure    High cholesterol    Sleep apnea    BIPAP    Past Surgical History:  Procedure Laterality Date   BARIATRIC SURGERY  06/15/2017   CARDIAC CATHETERIZATION     COLONOSCOPY WITH PROPOFOL  N/A 10/10/2018   Procedure: COLONOSCOPY WITH PROPOFOL ;  Surgeon: Therisa Bi, MD;  Location: [facility] ENDOSCOPY;  Service: Gastroenterology;  Laterality: N/A;   FRACTURE SURGERY  1990   RIGHT LEG    KNEE SURGERY     REPLACEMENT TOTAL KNEE Left    TONSILLECTOMY     TOTAL ABDOMINAL HYSTERECTOMY W/ BILATERAL SALPINGOOPHORECTOMY     TOTAL KNEE ARTHROPLASTY Left 04/28/2020   Procedure: TOTAL KNEE ARTHROPLASTY;  Surgeon: Leora Lynwood SAUNDERS, MD;  Location: [facility] ORS;  Service: Orthopedics;  Laterality: Left;   TUBAL LIGATION      Current Medications: No outpatient medications have been marked as taking for the 10/30/23 encounter (Appointment) with Abigail Bernardino HERO, PA-C.    Allergies:   Ketorolac, Prednisone, and Nsaids   Social History   Socioeconomic History   Marital status: Married    Spouse name: Not on file   Number of children: Not on file   Years of education: Not on file   Highest education level: Not on file  Occupational History   Not on file  Tobacco Use   Smoking status: Never   Smokeless tobacco: Never  Vaping  Use   Vaping status: Never Used  Substance and Sexual Activity   Alcohol use: Yes    Comment: occassional   Drug use: No   Sexual activity: Not Currently  Other Topics Concern   Not on file  Social History Narrative   Not on file   Social Drivers of Health   Financial Resource Strain: Not on file  Food Insecurity: Not on file  Transportation Needs: Not on file  Physical Activity: Not on file  Stress: Not on file  Social Connections: Not on file     Family History:  The patient's family history includes Cerebral aneurysm in her mother; Depression in her sister; Diabetes in her brother, father, and paternal grandfather; Heart attack in her  maternal grandfather and sister; Heart disease in her paternal grandfather; High blood pressure in her brother; Hyperlipidemia in her paternal grandfather and son; Hypertension in her sister; Kidney disease in her sister; Obesity in her sister; Stroke in her father, maternal grandmother, and sister.  ROS:   12-point review of systems is negative unless otherwise noted in the HPI.   EKGs/Labs/Other Studies Reviewed:    Studies reviewed were summarized above. The additional studies were reviewed today:  2D echo 10/18/2020: 1. Left ventricular ejection fraction, by estimation, is 55 to 60%. The  left ventricle has normal function. The left ventricle has no regional  wall motion abnormalities. Left ventricular diastolic parameters are  consistent with Grade I diastolic  dysfunction (impaired relaxation). The average left ventricular global  longitudinal strain is -19.7 %. The global longitudinal strain is normal.   2. Right ventricular systolic function is normal. The right ventricular  size is normal. There is normal pulmonary artery systolic pressure.   3. Left atrial size was mildly dilated.   4. The mitral valve is normal in structure. No evidence of mitral valve  regurgitation. No evidence of mitral stenosis.   5. The aortic valve has an indeterminant number of cusps but likely  tricuspid. Aortic valve regurgitation is mild. Mild to moderate aortic  valve sclerosis/calcification is present, without any evidence of aortic  stenosis.   6. The inferior vena cava is normal in size with greater than 50%  respiratory variability, suggesting right atrial pressure of 3 mmHg.   Comparison(s): Prior Echo (Care Everywhere) showed LV EF 55%, normal LV  and RV function, mild AI, mild AS (mean gradient 13 mmHg).  __________  2D echo 04/18/2017 ([facility]): NORMAL LEFT VENTRICULAR SYSTOLIC FUNCTION WITH MILD LVH    NORMAL LA PRESSURES WITH NORMAL DIASTOLIC FUNCTION    NORMAL RIGHT VENTRICULAR SYSTOLIC  FUNCTION    VALVULAR REGURGITATION: MILD AR, TRIVIAL PR, TRIVIAL TR    VALVULAR STENOSIS: MILD AS    MILD AS.    NO PRIOR STUDY FOR COMPARISON     EKG:  EKG is ordered today.  The EKG ordered today demonstrates ***  Recent Labs: 12/11/2022: TSH 1.470 05/16/2023: Hemoglobin 12.1; Platelets 153 08/14/2023: ALT 32; BUN 21; Creatinine, Ser 1.49; Potassium 4.6; Sodium 145  Recent Lipid Panel    Component Value Date/Time   CHOL 165 08/14/2023 1104   TRIG 80 08/14/2023 1104   HDL 65 08/14/2023 1104   CHOLHDL 2.5 08/14/2023 1104   LDLCALC 85 08/14/2023 1104    PHYSICAL EXAM:    VS:  There were no vitals taken for this visit.  BMI: There is no height or weight on file to calculate BMI.  Physical Exam  Wt Readings from Last 3 Encounters:  08/14/23  253 lb 6.4 oz (114.9 kg)  05/16/23 255 lb 6.4 oz (115.8 kg)  12/11/22 238 lb 9.6 oz (108.2 kg)     ASSESSMENT & PLAN:   HTN: Blood pressure  Aortic stenosis: Mild by echo in 2018 with most recent echo from 2022 showing mild aortic regurgitation with mild to moderate aortic valve sclerosis without evidence of stenosis.  HLD: LDL 85 in 07/2023.  CKD stage III:  Obesity with OSA:   {Are you ordering a CV Procedure (e.g. stress test, cath, DCCV, TEE, etc)?   Press F2        :[id number]}     Disposition: F/u with Dr. Gollan or an APP in ***.   Medication Adjustments/Labs and Tests Ordered: Current medicines are reviewed at length with the patient today.  Concerns regarding medicines are outlined above. Medication changes, Labs and Tests ordered today are summarized above and listed in the Patient Instructions accessible in Encounters.   Signed, Bernardino Bring, PA-C 10/29/2023 9:33 AM     [facility] [place] [address] [place], [place] [address] [phone]

## 2023-10-29 NOTE — Assessment & Plan Note (Signed)
Chronic.  Seems to be doing well with anxiety however, depression is not well controlled.  Patient has been on Wellbutrin 300 and Cymbalta 90mg  and does not feel like they are helping.  Having feelings of anhedonia.  Has tried Jordan and abilify without significant relief of symptoms.  Has been on Rexulti in the past which is improved her mood better than any other medications she has tried.  She has changed insurances since our last visit.  Would liek to see if  Rexulti will be covered.  Follow up in 2 months. Call sooner if concerns arise.

## 2023-10-30 ENCOUNTER — Ambulatory Visit: Payer: PPO | Admitting: Cardiology

## 2023-10-30 ENCOUNTER — Encounter: Payer: Self-pay | Admitting: Nurse Practitioner

## 2023-10-30 ENCOUNTER — Ambulatory Visit: Payer: PPO | Admitting: Physician Assistant

## 2023-10-30 NOTE — Progress Notes (Deleted)
 Cardiology Office Note    Date:  10/30/2023   ID:  Angela Mullen 11-26-1955, MRN [id number]  PCP:  Melvin Pao, NP  Cardiologist:  Evalene Lunger, MD  Electrophysiologist:  None   Chief Complaint: Follow-up  History of Present Illness:   Angela Mullen is a 68 y.o. female with history of HTN, HLD, DM2, CKD stage III, COPD with remote smoking history, obesity status post gastric bypass, OSA, and GERD who presents for ***  She was previously followed by Dr. Ammon, establishing her care with Dr. Gollan in 2019.  LHC in 2015 showed normal coronary anatomy and normal LV systolic function.  Echo from 2018 showed an EF of greater than 55%, normal wall motion, mild concentric LVH, normal diastolic function, normal RV systolic function, and mild aortic insufficiency and aortic stenosis.  Echo from 09/2020 showed an EF of 55 to 60%, no regional wall motion normalities, grade 1 diastolic dysfunction, normal RV systolic function and ventricular cavity size, normal RVSP, mildly dilated left atrium, indeterminate number of aortic valve cusps with mild insufficiency and mild to moderate sclerosis without evidence of aortic stenosis.  She was most recently seen in the office in 01/2022 and was without symptoms of angina or cardiac decompensation.  ***   Labs independently reviewed: 07/2023 - A1c 5.6, TC 165, TG 80, HDL 65, LDL 85, BUN 21, serum creatinine 1.49, potassium 4.6, albumin 4.2, AST/ALT normal 04/2023 - Hgb 12.1, PLT 153 11/2022 - TSH normal  Past Medical History:  Diagnosis Date   Abnormal liver enzymes 09/03/2014   Acid reflux    Allergy    Anemia    H/O   Anxiety    Arthritis    Cataract    COPD (chronic obstructive pulmonary disease) (HCC)    Depression    Diabetes mellitus without complication (HCC)    Dyspnea    WITH EXERTION DUE TO COPD   Elevation of level of transaminase or lactic acid dehydrogenase (LDH) 08/27/2012   Fatty liver    Headache    H/O  MIGRAINES   High blood pressure    High cholesterol    Sleep apnea    BIPAP    Past Surgical History:  Procedure Laterality Date   BARIATRIC SURGERY  06/15/2017   CARDIAC CATHETERIZATION     COLONOSCOPY WITH PROPOFOL  N/A 10/10/2018   Procedure: COLONOSCOPY WITH PROPOFOL ;  Surgeon: Therisa Bi, MD;  Location: [facility] ENDOSCOPY;  Service: Gastroenterology;  Laterality: N/A;   FRACTURE SURGERY  1990   RIGHT LEG    KNEE SURGERY     REPLACEMENT TOTAL KNEE Left    TONSILLECTOMY     TOTAL ABDOMINAL HYSTERECTOMY W/ BILATERAL SALPINGOOPHORECTOMY     TOTAL KNEE ARTHROPLASTY Left 04/28/2020   Procedure: TOTAL KNEE ARTHROPLASTY;  Surgeon: Leora Lynwood SAUNDERS, MD;  Location: [facility] ORS;  Service: Orthopedics;  Laterality: Left;   TUBAL LIGATION      Current Medications: No outpatient medications have been marked as taking for the 10/30/23 encounter (Appointment) with Abigail Bernardino HERO, PA-C.    Allergies:   Ketorolac, Prednisone, and Nsaids   Social History   Socioeconomic History   Marital status: Married    Spouse name: Not on file   Number of children: Not on file   Years of education: Not on file   Highest education level: Some college, no degree  Occupational History   Not on file  Tobacco Use   Smoking status: Never   Smokeless tobacco: Never  Vaping Use   Vaping status: Never Used  Substance and Sexual Activity   Alcohol use: Yes    Comment: occassional   Drug use: No   Sexual activity: Not Currently  Other Topics Concern   Not on file  Social History Narrative   Not on file   Social Drivers of Health   Financial Resource Strain: Medium Risk (10/29/2023)   Overall Financial Resource Strain (CARDIA)    Difficulty of Paying Living Expenses: Somewhat hard  Food Insecurity: Food Insecurity Present (10/29/2023)   Hunger Vital Sign    Worried About Running Out of Food in the Last Year: Often true    Ran Out of Food in the Last Year: Often true  Transportation Needs: Unmet  Transportation Needs (10/29/2023)   PRAPARE - Administrator, Civil Service (Medical): Yes    Lack of Transportation (Non-Medical): Yes  Physical Activity: Unknown (10/29/2023)   Exercise Vital Sign    Days of Exercise per Week: 0 days    Minutes of Exercise per Session: Not on file  Stress: Stress Concern Present (10/29/2023)   [employer] of Occupational Health - Occupational Stress Questionnaire    Feeling of Stress : Rather much  Social Connections: Moderately Isolated (10/29/2023)   Social Connection and Isolation Panel [NHANES]    Frequency of Communication with Friends and Family: Three times a week    Frequency of Social Gatherings with Friends and Family: Never    Attends Religious Services: Never    Database Administrator or Organizations: No    Attends Engineer, Structural: Not on file    Marital Status: Married     Family History:  The patient's family history includes Cerebral aneurysm in her mother; Depression in her sister; Diabetes in her brother, father, and paternal grandfather; Heart attack in her maternal grandfather and sister; Heart disease in her paternal grandfather; High blood pressure in her brother; Hyperlipidemia in her paternal grandfather and son; Hypertension in her sister; Kidney disease in her sister; Obesity in her sister; Stroke in her father, maternal grandmother, and sister.  ROS:   12-point review of systems is negative unless otherwise noted in the HPI.   EKGs/Labs/Other Studies Reviewed:    Studies reviewed were summarized above. The additional studies were reviewed today:  2D echo 10/18/2020: 1. Left ventricular ejection fraction, by estimation, is 55 to 60%. The  left ventricle has normal function. The left ventricle has no regional  wall motion abnormalities. Left ventricular diastolic parameters are  consistent with Grade I diastolic  dysfunction (impaired relaxation). The average left ventricular global  longitudinal  strain is -19.7 %. The global longitudinal strain is normal.   2. Right ventricular systolic function is normal. The right ventricular  size is normal. There is normal pulmonary artery systolic pressure.   3. Left atrial size was mildly dilated.   4. The mitral valve is normal in structure. No evidence of mitral valve  regurgitation. No evidence of mitral stenosis.   5. The aortic valve has an indeterminant number of cusps but likely  tricuspid. Aortic valve regurgitation is mild. Mild to moderate aortic  valve sclerosis/calcification is present, without any evidence of aortic  stenosis.   6. The inferior vena cava is normal in size with greater than 50%  respiratory variability, suggesting right atrial pressure of 3 mmHg.   Comparison(s): Prior Echo ([facility]) showed LV EF 55%, normal LV  and RV function, mild AI, mild AS (mean gradient 13  mmHg).  __________  2D echo 04/18/2017 ([facility]): NORMAL LEFT VENTRICULAR SYSTOLIC FUNCTION WITH MILD LVH    NORMAL LA PRESSURES WITH NORMAL DIASTOLIC FUNCTION    NORMAL RIGHT VENTRICULAR SYSTOLIC FUNCTION    VALVULAR REGURGITATION: MILD AR, TRIVIAL PR, TRIVIAL TR    VALVULAR STENOSIS: MILD AS    MILD AS.    NO PRIOR STUDY FOR COMPARISON     EKG:  EKG is ordered today.  The EKG ordered today demonstrates ***  Recent Labs: 12/11/2022: TSH 1.470 05/16/2023: Hemoglobin 12.1; Platelets 153 08/14/2023: ALT 32; BUN 21; Creatinine, Ser 1.49; Potassium 4.6; Sodium 145  Recent Lipid Panel    Component Value Date/Time   CHOL 165 08/14/2023 1104   TRIG 80 08/14/2023 1104   HDL 65 08/14/2023 1104   CHOLHDL 2.5 08/14/2023 1104   LDLCALC 85 08/14/2023 1104    PHYSICAL EXAM:    VS:  There were no vitals taken for this visit.  BMI: There is no height or weight on file to calculate BMI.  Physical Exam  Wt Readings from Last 3 Encounters:  10/29/23 246 lb 3.2 oz (111.7 kg)  08/14/23 253 lb 6.4 oz (114.9 kg)  05/16/23 255 lb 6.4 oz (115.8 kg)      ASSESSMENT & PLAN:   HTN: Blood pressure  Aortic stenosis: Mild by echo in 2018 with most recent echo from 2022 showing mild aortic regurgitation with mild to moderate aortic valve sclerosis without evidence of stenosis.  HLD: LDL 85 in 07/2023.  CKD stage III:  Obesity with OSA:   {Are you ordering a CV Procedure (e.g. stress test, cath, DCCV, TEE, etc)?   Press F2        :[id number]}     Disposition: F/u with Dr. Gollan or an APP in ***.   Medication Adjustments/Labs and Tests Ordered: Current medicines are reviewed at length with the patient today.  Concerns regarding medicines are outlined above. Medication changes, Labs and Tests ordered today are summarized above and listed in the Patient Instructions accessible in Encounters.   Signed, Bernardino Bring, PA-C 10/30/2023 9:00 AM     [facility] [place] [address] [place], [place] [address] [phone]

## 2023-10-31 ENCOUNTER — Ambulatory Visit: Payer: PPO | Attending: Cardiology | Admitting: Cardiology

## 2023-10-31 ENCOUNTER — Encounter: Payer: Self-pay | Admitting: Cardiology

## 2023-10-31 VITALS — BP 126/70 | HR 61 | Ht 64.0 in | Wt 243.4 lb

## 2023-10-31 DIAGNOSIS — I350 Nonrheumatic aortic (valve) stenosis: Secondary | ICD-10-CM

## 2023-10-31 DIAGNOSIS — J449 Chronic obstructive pulmonary disease, unspecified: Secondary | ICD-10-CM

## 2023-10-31 DIAGNOSIS — N1832 Chronic kidney disease, stage 3b: Secondary | ICD-10-CM | POA: Diagnosis not present

## 2023-10-31 DIAGNOSIS — I10 Essential (primary) hypertension: Secondary | ICD-10-CM | POA: Diagnosis not present

## 2023-10-31 DIAGNOSIS — E782 Mixed hyperlipidemia: Secondary | ICD-10-CM

## 2023-10-31 MED ORDER — ATENOLOL 50 MG PO TABS: ORAL_TABLET | ORAL | 3 refills | Status: DC

## 2023-10-31 NOTE — Progress Notes (Signed)
 Cardiology Office Note    Date:  10/31/2023   ID:  Angela, Mullen 1955/10/05, MRN [id number]  PCP:  Melvin Pao, NP  Cardiologist:  Evalene Lunger, MD  Electrophysiologist:  None   Chief Complaint: Follow-up  History of Present Illness:   Angela Mullen is a 68 y.o. female with history of HTN, HLD, DM2, CKD stage III, COPD with remote smoking history, obesity status post gastric bypass, OSA, and GERD who presents for follow-up.  She was previously followed by Dr. Ammon, establishing her care with Dr. Gollan in 2019.  LHC in 2015 showed normal coronary anatomy and normal LV systolic function.  Echo from 2018 showed an EF of greater than 55%, normal wall motion, mild concentric LVH, normal diastolic function, normal RV systolic function, and mild aortic insufficiency and aortic stenosis.  Echo from 09/2020 showed an EF of 55 to 60%, no regional wall motion normalities, grade 1 diastolic dysfunction, normal RV systolic function and ventricular cavity size, normal RVSP, mildly dilated left atrium, indeterminate number of aortic valve cusps with mild insufficiency and mild to moderate sclerosis without evidence of aortic stenosis.  She was most recently seen in the office in 01/2022 and was without symptoms of angina or cardiac decompensation.  She returns today stating that she has been doing well from a cardiac perspective without any complaints of angina or anginal equivalents.  She has lost 10 pounds since November of being on Ozempic  and has been working to regulate her blood sugars better.  She states that she has been compliant with her current medication regimen without any adverse side effects.  Denies any hospitalizations or visits to the emergency department.  Labs independently reviewed: 07/2023 - A1c 5.6, TC 165, TG 80, HDL 65, LDL 85, BUN 21, serum creatinine 1.49, potassium 4.6, albumin 4.2, AST/ALT normal 04/2023 - Hgb 12.1, PLT 153 11/2022 - TSH normal  Past  Medical History:  Diagnosis Date   Abnormal liver enzymes 09/03/2014   Acid reflux    Allergy    Anemia    H/O   Anxiety    Arthritis    Cataract    COPD (chronic obstructive pulmonary disease) (HCC)    Depression    Diabetes mellitus without complication (HCC)    Dyspnea    WITH EXERTION DUE TO COPD   Elevation of level of transaminase or lactic acid dehydrogenase (LDH) 08/27/2012   Fatty liver    Headache    H/O MIGRAINES   High blood pressure    High cholesterol    Sleep apnea    BIPAP    Past Surgical History:  Procedure Laterality Date   BARIATRIC SURGERY  06/15/2017   CARDIAC CATHETERIZATION     COLONOSCOPY WITH PROPOFOL  N/A 10/10/2018   Procedure: COLONOSCOPY WITH PROPOFOL ;  Surgeon: Therisa Bi, MD;  Location[facility] ENDOSCOPY;  Service: Gastroenterology;  Laterality: N/A;   FRACTURE SURGERY  1990   RIGHT LEG    KNEE SURGERY     REPLACEMENT TOTAL KNEE Left    TONSILLECTOMY     TOTAL ABDOMINAL HYSTERECTOMY W/ BILATERAL SALPINGOOPHORECTOMY     TOTAL KNEE ARTHROPLASTY Left 04/28/2020   Procedure: TOTAL KNEE ARTHROPLASTY;  Surgeon: Leora Lynwood SAUNDERS, MD;  Location: [facility] ORS;  Service: Orthopedics;  Laterality: Left;   TUBAL LIGATION      Current Medications: Current Meds  Medication Sig   acetaminophen  (TYLENOL ) 500 MG tablet Take 1,000 mg by mouth every 6 (six) hours as needed for moderate pain.  albuterol  (VENTOLIN  HFA) 108 (90 Base) MCG/ACT inhaler Inhale 1-2 puffs into the lungs every 6 (six) hours as needed for wheezing or shortness of breath.   amLODipine  (NORVASC ) 5 MG tablet Take 1 tablet by mouth once daily   buPROPion  (WELLBUTRIN  SR) 150 MG 12 hr tablet Take 1 tablet (150 mg total) by mouth 2 (two) times daily.   clotrimazole -betamethasone  (LOTRISONE ) cream APPLY  CREAM TOPICALLY TWICE DAILY AS NEEDED   colchicine  0.6 MG tablet Take 1 tablet (0.6 mg total) by mouth daily. Take at the onset of symptoms for 5 days   DULoxetine  (CYMBALTA ) 30 MG capsule Take  1 capsule (30 mg total) by mouth at bedtime.   DULoxetine  (CYMBALTA ) 60 MG capsule Take 1 capsule (60 mg total) by mouth at bedtime.   lisinopril  (ZESTRIL ) 20 MG tablet Take 1 tablet (20 mg total) by mouth daily.   lovastatin  (MEVACOR ) 40 MG tablet Take 1 tablet (40 mg total) by mouth at bedtime.   methocarbamol  (ROBAXIN ) 750 MG tablet TAKE 1 TABLET (750 MG TOTAL) BY MOUTH EVERY 8 (EIGHT) HOURS AS NEEDED.   pregabalin  (LYRICA ) 150 MG capsule Take 2 capsules by mouth once daily   Semaglutide ,0.25 or 0.5MG /DOS, (OZEMPIC , 0.25 OR 0.5 MG/DOSE,) 2 MG/3ML SOPN Inject 0.25 mg into the skin once a week.   traZODone  (DESYREL ) 100 MG tablet Take 2 tablets (200 mg total) by mouth at bedtime as needed for sleep.   [DISCONTINUED] atenolol  (TENORMIN ) 50 MG tablet TAKE 1 TABLET BY MOUTH IN THE MORNING AND 2 IN THE EVENING. Must keep 10/30/23 appointment for future refills. (Final attempt)    Allergies:   Ketorolac, Prednisone, and Nsaids   Social History   Socioeconomic History   Marital status: Married    Spouse name: Not on file   Number of children: Not on file   Years of education: Not on file   Highest education level: Some college, no degree  Occupational History   Not on file  Tobacco Use   Smoking status: Never   Smokeless tobacco: Never  Vaping Use   Vaping status: Never Used  Substance and Sexual Activity   Alcohol use: Yes    Comment: occassional   Drug use: No   Sexual activity: Not Currently  Other Topics Concern   Not on file  Social History Narrative   Not on file   Social Drivers of Health   Financial Resource Strain: Medium Risk (10/29/2023)   Overall Financial Resource Strain (CARDIA)    Difficulty of Paying Living Expenses: Somewhat hard  Food Insecurity: Food Insecurity Present (10/29/2023)   Hunger Vital Sign    Worried About Running Out of Food in the Last Year: Often true    Ran Out of Food in the Last Year: Often true  Transportation Needs: Unmet Transportation Needs  (10/29/2023)   PRAPARE - Administrator, Civil Service (Medical): Yes    Lack of Transportation (Non-Medical): Yes  Physical Activity: Unknown (10/29/2023)   Exercise Vital Sign    Days of Exercise per Week: 0 days    Minutes of Exercise per Session: Not on file  Stress: Stress Concern Present (10/29/2023)   [employer] of Occupational Health - Occupational Stress Questionnaire    Feeling of Stress : Rather much  Social Connections: Moderately Isolated (10/29/2023)   Social Connection and Isolation Panel [NHANES]    Frequency of Communication with Friends and Family: Three times a week    Frequency of Social Gatherings with Friends and  Family: Never    Attends Religious Services: Never    Database Administrator or Organizations: No    Attends Engineer, Structural: Not on file    Marital Status: Married     Family History:  The patient's family history includes Cerebral aneurysm in her mother; Depression in her sister; Diabetes in her brother, father, and paternal grandfather; Heart attack in her maternal grandfather and sister; Heart disease in her paternal grandfather; High blood pressure in her brother; Hyperlipidemia in her paternal grandfather and son; Hypertension in her sister; Kidney disease in her sister; Obesity in her sister; Stroke in her father, maternal grandmother, and sister.  ROS:   12-point review of systems is negative unless otherwise noted in the HPI.   EKGs/Labs/Other Studies Reviewed:    Studies reviewed were summarized above. The additional studies were reviewed today:  2D echo 10/18/2020: 1. Left ventricular ejection fraction, by estimation, is 55 to 60%. The  left ventricle has normal function. The left ventricle has no regional  wall motion abnormalities. Left ventricular diastolic parameters are  consistent with Grade I diastolic  dysfunction (impaired relaxation). The average left ventricular global  longitudinal strain is -19.7 %.  The global longitudinal strain is normal.   2. Right ventricular systolic function is normal. The right ventricular  size is normal. There is normal pulmonary artery systolic pressure.   3. Left atrial size was mildly dilated.   4. The mitral valve is normal in structure. No evidence of mitral valve  regurgitation. No evidence of mitral stenosis.   5. The aortic valve has an indeterminant number of cusps but likely  tricuspid. Aortic valve regurgitation is mild. Mild to moderate aortic  valve sclerosis/calcification is present, without any evidence of aortic  stenosis.   6. The inferior vena cava is normal in size with greater than 50%  respiratory variability, suggesting right atrial pressure of 3 mmHg.   Comparison(s): Prior Echo ([facility]) showed LV EF 55%, normal LV  and RV function, mild AI, mild AS (mean gradient 13 mmHg).   2D echo 04/18/2017 ([facility]): NORMAL LEFT VENTRICULAR SYSTOLIC FUNCTION WITH MILD LVH    NORMAL LA PRESSURES WITH NORMAL DIASTOLIC FUNCTION    NORMAL RIGHT VENTRICULAR SYSTOLIC FUNCTION    VALVULAR REGURGITATION: MILD AR, TRIVIAL PR, TRIVIAL TR    VALVULAR STENOSIS: MILD AS    MILD AS.    NO PRIOR STUDY FOR COMPARISON     EKG:  EKG is ordered today.  The EKG ordered today demonstrates sinus rhythm with a rate of 69 with left axis deviation with no acute change from prior studies  Recent Labs: 12/11/2022: TSH 1.470 05/16/2023: Hemoglobin 12.1; Platelets 153 08/14/2023: ALT 32; BUN 21; Creatinine, Ser 1.49; Potassium 4.6; Sodium 145  Recent Lipid Panel    Component Value Date/Time   CHOL 165 08/14/2023 1104   TRIG 80 08/14/2023 1104   HDL 65 08/14/2023 1104   CHOLHDL 2.5 08/14/2023 1104   LDLCALC 85 08/14/2023 1104    PHYSICAL EXAM:    VS:  BP 126/70   Pulse 61   Ht 5' 4 (1.626 m)   Wt 243 lb 6.4 oz (110.4 kg)   SpO2 97%   BMI 41.78 kg/m   BMI: Body mass index is 41.78 kg/m.  Physical Exam Constitutional:      Appearance: Normal  appearance.  HENT:     Head: Normocephalic and atraumatic.  Cardiovascular:     Rate and Rhythm: Normal rate and regular  rhythm.     Pulses: Normal pulses.     Heart sounds: Normal heart sounds.  Pulmonary:     Effort: Pulmonary effort is normal.     Breath sounds: Normal breath sounds.  Abdominal:     General: Bowel sounds are normal.  Musculoskeletal:        General: Normal range of motion.  Skin:    General: Skin is warm and dry.     Capillary Refill: Capillary refill takes 2 to 3 seconds.  Neurological:     General: No focal deficit present.     Mental Status: She is alert and oriented to person, place, and time.  Psychiatric:        Mood and Affect: Mood normal.        Behavior: Behavior normal.      Wt Readings from Last 3 Encounters:  10/31/23 243 lb 6.4 oz (110.4 kg)  10/29/23 246 lb 3.2 oz (111.7 kg)  08/14/23 253 lb 6.4 oz (114.9 kg)     ASSESSMENT & PLAN:   HTN: Blood pressure 126/70.  Blood pressure has been stable and well-controlled.  She has been continued on amlodipine  5 mg daily, atenolol  1 tablet in the morning and 2 tablets in the evening, lisinopril  20 mg daily.  She has been encouraged to continue to monitor pressures 1 to 2 hours postmedication administration at home as well.  Aortic stenosis: Mild by echo in 2018 with most recent echo from 2022 showing mild aortic regurgitation with mild to moderate aortic valve sclerosis without evidence of stenosis.  Continue optimal blood pressure and heart rate control.  Will continue with surveillance studies, with repeat study to be ordered on return.  HLD: LDL 85 in 07/2023.  She is continued on lovastatin  40 mg daily.  This continues to be managed by her PCP.  CKD stage III: Serum creatinine 1.49 08/14/2023.  Continue to avoid NSAIDs.  She remains on lisinopril  for renal protection.  Kidney function continues to be followed by nephrology.  Obesity with OSA: Congratulated on her weight loss of 10 pounds weight  loss since November 2024.  She is continued on Ozempic .  She states she has been compliant with CPAP.  Disposition: F/u with Dr. Gollan or an APP in 11-12 months or sooner if needed for reevaluation of symptoms.   Medication Adjustments/Labs and Tests Ordered: Current medicines are reviewed at length with the patient today.  Concerns regarding medicines are outlined above. Medication changes, Labs and Tests ordered today are summarized above and listed in the Patient Instructions accessible in Encounters.   Bonney Tylene Lunch, ACNPC-AG 10/31/2023 8:47 AM     [facility] [place] [address] [place], [place] [address] [phone]

## 2023-10-31 NOTE — Patient Instructions (Signed)
Medication Instructions:  No changes *If you need a refill on your cardiac medications before your next appointment, please call your pharmacy*   Lab Work: None ordered If you have labs (blood work) drawn today and your tests are completely normal, you will receive your results only by: MyChart Message (if you have MyChart) OR A paper copy in the mail If you have any lab test that is abnormal or we need to change your treatment, we will call you to review the results.   Testing/Procedures: None ordered   Follow-Up: At [facility], you and your health needs are our priority.  As part of our continuing mission to provide you with exceptional heart care, we have created designated Provider Care Teams.  These Care Teams include your primary Cardiologist (physician) and Advanced Practice Providers (APPs -  Physician Assistants and Nurse Practitioners) who all work together to provide you with the care you need, when you need it.  We recommend signing up for the patient portal called "MyChart".  Sign up information is provided on this After Visit Summary.  MyChart is used to connect with patients for Virtual Visits (Telemedicine).  Patients are able to view lab/test results, encounter notes, upcoming appointments, etc.  Non-urgent messages can be sent to your provider as well.   To learn more about what you can do with MyChart, go to [email or web address].    Your next appointment:   12 month(s)  Provider:   You may see Julien Nordmann, MD or one of the following Advanced Practice Providers on your designated Care Team:   Nicolasa Ducking, NP Eula Listen, PA-C Cadence Fransico Michael, PA-C Charlsie Quest, NP Carlos Levering, NP

## 2023-11-01 MED ORDER — ARIPIPRAZOLE 2 MG PO TABS: 2.0000 mg | ORAL_TABLET | Freq: Every day | ORAL | 1 refills | Status: DC

## 2023-11-18 ENCOUNTER — Other Ambulatory Visit: Payer: Self-pay | Admitting: Cardiovascular Disease

## 2023-11-18 DIAGNOSIS — E782 Mixed hyperlipidemia: Secondary | ICD-10-CM

## 2023-11-20 ENCOUNTER — Other Ambulatory Visit: Payer: Self-pay | Admitting: Nurse Practitioner

## 2023-11-20 ENCOUNTER — Other Ambulatory Visit: Payer: Self-pay | Admitting: Cardiovascular Disease

## 2023-11-21 NOTE — Telephone Encounter (Signed)
 Requested medication (s) are due for refill today: yes  Requested medication (s) are on the active medication list: yes  Last refill:  08/31/23  Future visit scheduled: yes  Notes to clinic:  Unable to refill per protocol, cannot delegate.      Requested Prescriptions  Pending Prescriptions Disp Refills   pregabalin (LYRICA) 150 MG capsule [Pharmacy Med Name: Pregabalin 150 MG Oral Capsule] 180 capsule 0    Sig: Take 2 capsules by mouth once daily     Not Delegated - Neurology:  Anticonvulsants - Controlled - pregabalin Failed - 11/21/2023  9:36 AM      Failed - This refill cannot be delegated      Failed - Cr in normal range and within 360 days    Creatinine  Date Value Ref Range Status  05/11/2013 0.97 0.60 - 1.30 mg/dL Final   Creatinine, Ser  Date Value Ref Range Status  08/14/2023 1.49 (H) 0.57 - 1.00 mg/dL Final         Passed - Completed PHQ-2 or PHQ-9 in the last 360 days      Passed - Valid encounter within last 12 months    Recent Outpatient Visits           3 months ago Stage 3b chronic kidney disease (HCC)   [place] [facility] Larae Grooms, NP   6 months ago DM (diabetes mellitus), type 2 with complications (HCC)   [place] [facility] Mecum, Oswaldo Conroy, PA-C   11 months ago Welcome to [employer] preventive visit   [place] [facility] Larae Grooms, NP   1 year ago Chronic obstructive pulmonary disease, unspecified COPD type [facility])   [place] [facility] Larae G[facility], NP   1 year ago Essential (primary) hypertension   [place] [facility] Larae Grooms, NP       Future Appointments             In 1 month Larae Grooms, NP  [place] [facility]

## 2023-11-22 ENCOUNTER — Telehealth: Payer: Self-pay

## 2023-11-22 NOTE — Progress Notes (Signed)
   11/22/2023  Patient ID: Angela Mullen, female   DOB: [birth year], 68 y.o.   MRN: [id number]  In basket message from patient's PCP inquiring about any patient assistance available for Rexulti.  Patient previously responded well to this therapy, but it is no longer affordable for her.  Upon further research, this medication does have a patient assistance program; so I attempted to contact the patient to verify eligibility.  I was not able to reach her, so HIPAA compliant voicemail with my direct phone number was left.  I am also sending a MyChart message and we will attempt to call again next week if I do not hear back.  [name], PharmD, DPLA

## 2023-11-26 ENCOUNTER — Other Ambulatory Visit: Payer: Self-pay | Admitting: Nurse Practitioner

## 2023-11-26 ENCOUNTER — Other Ambulatory Visit: Payer: Self-pay | Admitting: Physician Assistant

## 2023-11-26 NOTE — Telephone Encounter (Signed)
 Pt following up on her request for  traZODone (DESYREL) 100 MG tablet.  This medication was last filled by Jacquelin Hawking, who is no longer at this practice.   [id number] [place], [place] - [address]

## 2023-11-26 NOTE — Telephone Encounter (Unsigned)
 Copied from CRM [id number]. Topic: Clinical - Medication Refill >> Nov 26, 2023  9:48 AM Payton Doughty wrote: Most Recent Primary Care Visit:  Provider: Larae Grooms  Department: [id number]  Visit Type: OFFICE VISIT  Date: 08/14/2023  Medication: traZODone (DESYREL) 100 MG tablet   Has the patient contacted their pharmacy? Yes (Agent: If no, request that the patient contact the pharmacy for the refill. If patient does not wish to contact the pharmacy document the reason why and proceed with request.) (Agent: If yes, when and what did the pharmacy advise?)  Is this the correct pharmacy for this prescription? Yes If no, delete pharmacy and type the correct one.  This is the patient's preferred pharmacy:  [facility] [address], [place] - [address] [place] [address] Phone: [phone] Fax: [phone]   Has the prescription been filled recently? Yes  Is the patient out of the medication? Yes  Has the patient been seen for an appointment in the last year OR does the patient have an upcoming appointment? Yes  Can we respond through MyChart? Yes  Agent: Please be advised that Rx refills may take up to 3 business days. We ask that you follow-up with your pharmacy.

## 2023-11-27 NOTE — Telephone Encounter (Signed)
 Requested Prescriptions  Pending Prescriptions Disp Refills   traZODone (DESYREL) 100 MG tablet [Pharmacy Med Name: traZODone HCl 100 MG Oral Tablet] 180 tablet 0    Sig: TAKE 2 TABLETS BY MOUTH AT BEDTIME AS NEEDED FOR SLEEP     Psychiatry: Antidepressants - Serotonin Modulator Passed - 11/27/2023  1:25 PM      Passed - Completed PHQ-2 or PHQ-9 in the last 360 days      Passed - Valid encounter within last 6 months    Recent Outpatient Visits           3 months ago Stage 3b chronic kidney disease (HCC)   [place] [facility] Larae Grooms, NP   6 months ago DM (diabetes mellitus), type 2 with complications (HCC)   [place] [facility] Mecum, Oswaldo Conroy, PA-C   11 months ago Welcome to [employer] preventive visit   [place] [facility] Larae Grooms, NP   1 year ago Chronic obstructive pulmonary disease, unspecified COPD type [facility])   [place] [facility] Larae G[facility], NP   1 year ago Essential (primary) hypertension   [place] [facility] Larae Grooms, NP       Future Appointments             In 1 month Larae Grooms, NP [place] [facility]

## 2023-11-28 ENCOUNTER — Ambulatory Visit: Payer: PPO | Admitting: Nurse Practitioner

## 2023-11-29 ENCOUNTER — Telehealth: Payer: Self-pay

## 2023-11-29 ENCOUNTER — Other Ambulatory Visit: Payer: Self-pay

## 2023-11-29 MED ORDER — ONETOUCH ULTRA 2 W/DEVICE KIT: 1.0000 | PACK | Freq: Every day | 0 refills | Status: DC

## 2023-11-29 MED ORDER — ONETOUCH ULTRA VI STRP: 1.0000 | ORAL_STRIP | Freq: Every day | 3 refills | Status: AC

## 2023-11-29 MED ORDER — ONETOUCH ULTRASOFT LANCETS MISC: 1.0000 | Freq: Every day | 3 refills | Status: DC

## 2023-11-29 NOTE — Telephone Encounter (Signed)
 Called and notified patient that new meter and supplies have been sent in for her.

## 2023-11-29 NOTE — Telephone Encounter (Signed)
 Routing to provider. RX for new meter and supplies t'd up for the provider.    Copied from CRM [id number]. Topic: Clinical - Prescription Issue >> Nov 29, 2023 10:55 AM Angela Mullen wrote: Reason for CRM: Patient says her diabetic meter is no longer processing anymore and would like to get a new prescription for a meter and testing strips

## 2023-11-29 NOTE — Progress Notes (Signed)
   11/29/2023  Patient ID: Angela Mullen, female   DOB: December 17, 1955, 68 y.o.   MRN: [id number]  Second outreach attempt to schedule a telephone visit to assist with affordability of Rexulti. I was not able to reach the patient, but a HIPAA compliant voicemail with my direct phone number was left.  If I do not hear back from the patient, but she is to reach her again next week.  [name], PharmD, DPLA

## 2023-12-01 ENCOUNTER — Other Ambulatory Visit: Payer: Self-pay | Admitting: Nurse Practitioner

## 2023-12-03 NOTE — Telephone Encounter (Signed)
 Requested medications are due for refill today.  yes  Requested medications are on the active medications list.  yes  Last refill. 09/05/2023 30 g 0 rf  Future visit scheduled.   yes  Notes to clinic.  Medication not assigned to a protocol Please review for refill.    Requested Prescriptions  Pending Prescriptions Disp Refills   clotrimazole-betamethasone (LOTRISONE) cream [Pharmacy Med Name: Clotrimazole-Betamethasone 1-0.05 % External Cream] 30 g 0    Sig: APPLY  CREAM TOPICALLY TWICE DAILY AS NEEDED     Off-Protocol Failed - 12/03/2023  2:15 PM      Failed - Medication not assigned to a protocol, review manually.      Passed - Valid encounter within last 12 months    Recent Outpatient Visits           3 months ago Stage 3b chronic kidney disease (HCC)   [place] [facility] Larae Grooms, NP   6 months ago DM (diabetes mellitus), type 2 with complications [facility])   [place] [facility] Mecum, Oswaldo Conroy, PA-C   11 months ago Welcome to [employer] preventive visit   [place] [facility] Larae Grooms, NP   1 year ago Chronic obstructive pulmonary disease, unspecified COPD type [facility])   [place] [facility] Larae Grooms, NP   1 year ago Essential (primary) hypertension   [place] [facility] Larae Grooms, NP       Future Appointments             In 1 month Larae Grooms, NP [place] [facility]

## 2023-12-04 ENCOUNTER — Ambulatory Visit

## 2023-12-05 ENCOUNTER — Telehealth: Payer: Self-pay | Admitting: Nurse Practitioner

## 2023-12-05 NOTE — Telephone Encounter (Signed)
 Copied from CRM [id number]. Topic: Clinical - Medication Question >> Dec 05, 2023  9:30 AM Marland Kitchen D wrote: Patient wants to know if she needs to continue to take her medication that she got from her doctor at her last visit. Patient can be reached by phone and MyChart

## 2023-12-05 NOTE — Telephone Encounter (Signed)
 Called and spoke with patient. Patient states she is going to be able to get the Rexulti from [place] to start taking. Patient wants to know if she should stop the Abilify when she gets the Rexulti.

## 2023-12-06 NOTE — Telephone Encounter (Signed)
 Yes she can stop taking the Abilify when she gets the Rexulti.

## 2023-12-06 NOTE — Telephone Encounter (Signed)
Called and LVM notifying patient of Karen's message.

## 2023-12-07 ENCOUNTER — Telehealth: Payer: Self-pay

## 2023-12-07 NOTE — Telephone Encounter (Signed)
 Copied from CRM [id number]. Topic: Clinical - Medication Question >> Dec 07, 2023 11:05 AM Fredrich Romans wrote: Reason for CRM: Patient called in to let pharmacist Elnita Maxwell know that the correct medication was ozempic. She spoke with her today and gave her the wrong information regarding the wrong price and the wrong medication.

## 2023-12-07 NOTE — Progress Notes (Signed)
   12/07/2023  Patient ID: Angela Mullen, female   DOB: 1956-07-23, 68 y.o.   MRN: [id number]  Patient out reach to assist with affordability of medications.  Patient recently taking Rexulti and responded to medication well, but had to stop based on cost.  Patient/provider would like to switch from Abilify back to Rexulti if possible.  I was able to identify a patient assistance program through the manufacture of this medication; and after speaking with the patient, she should qualify for this program.  We also discussed affordability of Ozempic 0.25 mg, which she is currently well-controlled on; and this co-pay is also expensive for her at $94 a month.  Submitting [employer] patient assistance program application online today for Ozempic, and patient is coming in for [place] to complete Rexulti patient assistance program application on 3/26.  She is aware to bring in proof of income and photo ID, so I can make a copy of these to submit along with the application.  [name], PharmD, DPLA

## 2023-12-11 ENCOUNTER — Encounter

## 2023-12-11 ENCOUNTER — Telehealth: Payer: Self-pay | Admitting: Nurse Practitioner

## 2023-12-11 NOTE — Telephone Encounter (Signed)
 Copied from CRM [id number]. Topic: Medicare AWV >> Dec 11, 2023  2:10 PM Payton Doughty wrote: Reason for CRM: Called LVM 12/11/2023 to schedule AWV. Please schedule office or virtual visits.  Verlee Rossetti; Care Guide Ambulatory Clinical Support [place] [facility] Direct Dial: [phone]

## 2023-12-13 DIAGNOSIS — G4733 Obstructive sleep apnea (adult) (pediatric): Secondary | ICD-10-CM | POA: Diagnosis not present

## 2023-12-18 ENCOUNTER — Other Ambulatory Visit: Payer: Self-pay | Admitting: Physician Assistant

## 2023-12-18 ENCOUNTER — Telehealth: Payer: Self-pay

## 2023-12-18 NOTE — Progress Notes (Signed)
   12/18/2023  Patient ID: Angela Mullen, female   DOB: 1956-09-19, 68 y.o.   MRN: [id number]  Contacted [employer] PAP, and Ozempic 0.25/0.5mg  was approved 12/11/2023 and enrollment is good through 12/04/2024.  Medication *should* arrive at [facility] by 4/7.  If it has not, we can look into getting a 30 day voucher from [employer].    Prefilled Rexulti PAP application prior to tomorrow's in person visit to complete.  Sent patient MyChart message to remind of in person visit and what else to bring that will need to be submitted with Rexulti PAP application.  [name], PharmD, DPLA

## 2023-12-19 ENCOUNTER — Other Ambulatory Visit: Payer: Self-pay

## 2023-12-19 NOTE — Telephone Encounter (Signed)
 Requested Prescriptions  Pending Prescriptions Disp Refills   traZODone (DESYREL) 100 MG tablet [Pharmacy Med Name: traZODone HCl 100 MG Oral Tablet] 180 tablet 0    Sig: TAKE 2 TABLETS BY MOUTH AT BEDTIME AS NEEDED FOR SLEEP     Psychiatry: Antidepressants - Serotonin Modulator Passed - 12/19/2023  1:16 PM      Passed - Completed PHQ-2 or PHQ-9 in the last 360 days      Passed - Valid encounter within last 6 months    Recent Outpatient Visits           4 months ago Stage 3b chronic kidney disease (HCC)   [place] [facility] Larae Grooms, NP   7 months ago DM (diabetes mellitus), type 2 with complications (HCC)   [place] [facility] Mecum, Oswaldo Conroy, PA-C   1 year ago Welcome to [employer] preventive visit   [place] [facility] Larae Grooms, NP   1 year ago Chronic obstructive pulmonary disease, unspecified COPD type [facility])   [place] [facility] Larae G[facility], NP   1 year ago Essential (primary) hypertension   [place] [facility] Larae Grooms, NP       Future Appointments             In 2 weeks Larae Grooms, NP [place] [facility]

## 2023-12-19 NOTE — Progress Notes (Signed)
   12/19/2023  Patient ID: Angela Mullen, female   DOB: 02-Apr-1956, 68 y.o.   MRN: [id number]  Patient presenting to [facility] to complete [employer] PAP application for Rexulti 0.25mg .  Patient portion completed and copies of POI and driver's license made to include as required.  Obtaining provider's signature and faxing into company.  Will call Monday to check on processing status and inform patient-will also schedule 4-6 week telephone f/u visit at this time.  Informed patient she has been approved for [employer] PAP, and medication should arrive at [facility] 4/7.  Patient has my direct number, so she can contact me if Ozempic does not arrive by that date (should be able to get 30 day voucher).  [name], PharmD, DPLA

## 2023-12-21 ENCOUNTER — Telehealth: Payer: Self-pay

## 2023-12-21 NOTE — Telephone Encounter (Signed)
 Called patient to advise that her Ozempic has been delivered is available for pickup

## 2023-12-24 ENCOUNTER — Telehealth: Payer: Self-pay

## 2023-12-24 NOTE — Progress Notes (Signed)
   12/24/2023  Patient ID: Angela Mullen, female   DOB: 1956/02/02, 68 y.o.   MRN: [id number]  Contacted [employer] to check on processing status of Rexulti PAP application.  Representative states it is currently in the benefit verification stage and should be processed by tomorrow.  I will call back tomorrow to check the status.  [name], PharmD, DPLA

## 2023-12-25 ENCOUNTER — Telehealth: Payer: Self-pay

## 2023-12-25 NOTE — Progress Notes (Signed)
   12/25/2023  Patient ID: Angela Mullen, female   DOB: 09/27/1955, 68 y.o.   MRN: [id number]  Outreach to follow-up on PAP application for Rexulti, and patient has been denied since insurance covers the medication.  Contacted insurance; and patient's copay would be $461.41 for a 30 day supply, so I followed up with PAP to verify they are not able to assist if copay is not affordable even if insurance covers.  They verified no patient will be approved if insurance covers the medication, even if copay is not affordable.  Looked for grants to assist with coipay of medication, and I was not able to identify any.  Attempted to reach patient to make her aware but had to leave HIPAA compliant voicemail with my direct phone number.  [name], PharmD, DPLA

## 2023-12-26 ENCOUNTER — Telehealth: Payer: Self-pay

## 2023-12-26 NOTE — Progress Notes (Signed)
   12/26/2023  Patient ID: Angela Mullen, female   DOB: 03/07/1956, 68 y.o.   MRN: [id number]  Returning missed call/voicemail from patient returning my call from yesterday. Informed her that Rexulti PAP denied since her insurance covers the  medication even though copay is very expensive.  Patient will continue current regimen at this time.  [name], PharmD, DPLA

## 2023-12-27 ENCOUNTER — Encounter

## 2023-12-27 ENCOUNTER — Other Ambulatory Visit: Payer: Self-pay | Admitting: Cardiovascular Disease

## 2023-12-27 DIAGNOSIS — I10 Essential (primary) hypertension: Secondary | ICD-10-CM

## 2024-01-02 ENCOUNTER — Ambulatory Visit: Payer: PPO | Admitting: Nurse Practitioner

## 2024-01-02 ENCOUNTER — Encounter: Payer: Self-pay | Admitting: Nurse Practitioner

## 2024-01-02 ENCOUNTER — Ambulatory Visit
Admission: RE | Admit: 2024-01-02 | Discharge: 2024-01-02 | Disposition: A | Discharge status: Home / Self Care | Source: Ambulatory Visit | Attending: Nurse Practitioner | Admitting: Nurse Practitioner

## 2024-01-02 ENCOUNTER — Ambulatory Visit
Admission: RE | Admit: 2024-01-02 | Discharge: 2024-01-02 | Disposition: A | Discharge status: Home / Self Care | Attending: Nurse Practitioner | Admitting: Nurse Practitioner

## 2024-01-02 VITALS — BP 127/76 | HR 62 | Temp 98.7°F | Resp 16 | Ht 64.02 in | Wt 239.4 lb

## 2024-01-02 DIAGNOSIS — J0110 Acute frontal sinusitis, unspecified: Secondary | ICD-10-CM | POA: Diagnosis not present

## 2024-01-02 DIAGNOSIS — M25531 Pain in right wrist: Secondary | ICD-10-CM

## 2024-01-02 DIAGNOSIS — M7989 Other specified soft tissue disorders: Secondary | ICD-10-CM | POA: Diagnosis not present

## 2024-01-02 DIAGNOSIS — N1832 Chronic kidney disease, stage 3b: Secondary | ICD-10-CM

## 2024-01-02 DIAGNOSIS — E1169 Type 2 diabetes mellitus with other specified complication: Secondary | ICD-10-CM

## 2024-01-02 DIAGNOSIS — J449 Chronic obstructive pulmonary disease, unspecified: Secondary | ICD-10-CM | POA: Diagnosis not present

## 2024-01-02 DIAGNOSIS — F339 Major depressive disorder, recurrent, unspecified: Secondary | ICD-10-CM

## 2024-01-02 DIAGNOSIS — Z7189 Other specified counseling: Secondary | ICD-10-CM

## 2024-01-02 DIAGNOSIS — I10 Essential (primary) hypertension: Secondary | ICD-10-CM

## 2024-01-02 DIAGNOSIS — E118 Type 2 diabetes mellitus with unspecified complications: Secondary | ICD-10-CM

## 2024-01-02 DIAGNOSIS — Z0000 Encounter for general adult medical examination without abnormal findings: Secondary | ICD-10-CM

## 2024-01-02 DIAGNOSIS — M1811 Unilateral primary osteoarthritis of first carpometacarpal joint, right hand: Secondary | ICD-10-CM | POA: Diagnosis not present

## 2024-01-02 DIAGNOSIS — R2231 Localized swelling, mass and lump, right upper limb: Secondary | ICD-10-CM | POA: Diagnosis not present

## 2024-01-02 DIAGNOSIS — E785 Hyperlipidemia, unspecified: Secondary | ICD-10-CM | POA: Diagnosis not present

## 2024-01-02 DIAGNOSIS — D692 Other nonthrombocytopenic purpura: Secondary | ICD-10-CM | POA: Diagnosis not present

## 2024-01-02 LAB — MICROALBUMIN, URINE WAIVED
Creatinine, Urine Waived: 200 mg/dL (ref 10–300)
Microalb, Ur Waived: 150 mg/L — ABNORMAL HIGH (ref 0–19)

## 2024-01-02 LAB — URINALYSIS, ROUTINE W REFLEX MICROSCOPIC
Bilirubin, UA: NEGATIVE
Glucose, UA: NEGATIVE
Ketones, UA: NEGATIVE
Nitrite, UA: NEGATIVE
RBC, UA: NEGATIVE
Specific Gravity, UA: >1.03 — ABNORMAL HIGH (ref 1.005–1.030)
Urobilinogen, Ur: 0.2 mg/dL (ref 0.2–1.0)
pH, UA: 5.5 (ref 5.0–7.5)

## 2024-01-02 LAB — MICROSCOPIC EXAMINATION

## 2024-01-02 MED ORDER — ARIPIPRAZOLE 5 MG PO TABS: 5.0000 mg | ORAL_TABLET | Freq: Every day | ORAL | 1 refills | Status: DC

## 2024-01-02 MED ORDER — AMOXICILLIN 500 MG PO CAPS: 500.0000 mg | ORAL_CAPSULE | Freq: Two times a day (BID) | ORAL | 0 refills | Status: AC

## 2024-01-02 NOTE — Assessment & Plan Note (Signed)
 A voluntary discussion about advance care planning including the explanation and discussion of advance directives was extensively discussed  with the patient for 3 minutes with patient.  Explanation about the health care proxy and Living will was reviewed and packet with forms with explanation of how to fill them out was given.  During this discussion, the patient does and plan to fill out the paperwork required.  Patient was offered a separate Advance Care Planning visit for further assistance with forms.

## 2024-01-02 NOTE — Assessment & Plan Note (Signed)
 Chronic, Elevated at visit today but patient is worked up.  Will recheck at next visit and adjust medications at that time if needed. Appears well-managed today with current regimen comprised of lisinopril 20 mg p.o. daily, amlodipine 5 mg p.o. daily, atenolol 50 mg p.o. daily Continue current regimen Follow-up in 6 weeks or sooner if concerns arise

## 2024-01-02 NOTE — Assessment & Plan Note (Signed)
 Recommended eating smaller high protein, low fat meals more frequently and exercising 30 mins a day 5 times a week with a goal of 10-15lb weight loss in the next 3 months.

## 2024-01-02 NOTE — Assessment & Plan Note (Signed)
Chronic.  Controlled.  Continue with current medication regimen.  Labs ordered today.  Return to clinic in 3 months for reevaluation.  Call sooner if concerns arise.   

## 2024-01-02 NOTE — Assessment & Plan Note (Signed)
 Reassured patient.

## 2024-01-02 NOTE — Assessment & Plan Note (Signed)
 Chronic, controlled.  Appears well-controlled on current regimen comprised of lovastatin 40 mg p.o. daily Recheck lipid panel today, results to dictate further management Continue current regimen for now Follow-up in 3 months or sooner if concerns arise

## 2024-01-02 NOTE — Progress Notes (Signed)
 Subjective:   Angela Mullen is a 68 y.o. female who presents for Medicare Annual (Subsequent) preventive examination.  Visit Complete: In person  Patient Medicare AWV questionnaire was completed by the patient on 01/02/24; I have confirmed that all information answered by patient is correct and no changes since this date.  Cardiac Risk Factors include: advanced age (>17men, >73 women);diabetes mellitus;obesity (BMI >30kg/m2);sedentary lifestyle;hypertension     Objective:    Today's Vitals   01/02/24 1023  BP: 127/76  Pulse: 62  Resp: 16  Temp: 98.7 F (37.1 C)  TempSrc: Oral  SpO2: 98%  Weight: 239 lb 6.4 oz (108.6 kg)  Height: 5' 4.02" (1.626 m)  PainSc: 0-No pain   Body mass index is 41.07 kg/m.     01/02/2024   10:32 AM 04/28/2020    5:00 PM 04/16/2020   11:34 AM 10/10/2018    8:33 AM  Advanced Directives  Does Patient Have a Medical Advance Directive? No No No No  Would patient like information on creating a medical advance directive? Yes (MAU/Ambulatory/Procedural Areas - Information given) No - Patient declined      Current Medications (verified) Outpatient Encounter Medications as of 01/02/2024  Medication Sig   acetaminophen (TYLENOL) 500 MG tablet Take 1,000 mg by mouth every 6 (six) hours as needed for moderate pain.    albuterol (VENTOLIN HFA) 108 (90 Base) MCG/ACT inhaler Inhale 1-2 puffs into the lungs every 6 (six) hours as needed for wheezing or shortness of breath.   amLODipine (NORVASC) 5 MG tablet TAKE 1 TABLET BY MOUTH ONCE DAILY *PATIENT SHOULD KEEP APPOINTMENT FOR LONG TERM REFILLS PER MD*   amoxicillin (AMOXIL) 500 MG capsule Take 1 capsule (500 mg total) by mouth 2 (two) times daily for 10 days.   atenolol (TENORMIN) 50 MG tablet TAKE 1 TABLET BY MOUTH IN THE MORNING AND 2 IN THE EVENING.   Blood Glucose Monitoring Suppl (ONE TOUCH ULTRA 2) w/Device KIT 1 each by Does not apply route daily at 2 PM.   buPROPion (WELLBUTRIN SR) 150 MG 12 hr tablet  Take 1 tablet (150 mg total) by mouth 2 (two) times daily.   clotrimazole-betamethasone (LOTRISONE) cream APPLY  CREAM TOPICALLY TWICE DAILY AS NEEDED   colchicine 0.6 MG tablet Take 1 tablet (0.6 mg total) by mouth daily. Take at the onset of symptoms for 5 days   DULoxetine (CYMBALTA) 30 MG capsule Take 1 capsule (30 mg total) by mouth at bedtime.   DULoxetine (CYMBALTA) 60 MG capsule Take 1 capsule (60 mg total) by mouth at bedtime.   glucose blood (ONETOUCH ULTRA) test strip 1 each by Other route daily at 2 PM. Use as instructed   Lancets (ONETOUCH ULTRASOFT) lancets 1 each by Other route daily. Use as instructed   lisinopril (ZESTRIL) 20 MG tablet Take 1 tablet by mouth once daily   lovastatin (MEVACOR) 40 MG tablet TAKE 1 TABLET BY MOUTH AT BEDTIME   methocarbamol (ROBAXIN) 750 MG tablet TAKE 1 TABLET (750 MG TOTAL) BY MOUTH EVERY 8 (EIGHT) HOURS AS NEEDED.   pregabalin (LYRICA) 150 MG capsule Take 2 capsules by mouth once daily   Semaglutide,0.25 or 0.5MG /DOS, (OZEMPIC, 0.25 OR 0.5 MG/DOSE,) 2 MG/3ML SOPN Inject 0.25 mg into the skin once a week.   traZODone (DESYREL) 100 MG tablet TAKE 2 TABLETS BY MOUTH AT BEDTIME AS NEEDED FOR SLEEP   [DISCONTINUED] ARIPiprazole (ABILIFY) 2 MG tablet Take 1 tablet (2 mg total) by mouth daily.   ARIPiprazole (ABILIFY)  5 MG tablet Take 1 tablet (5 mg total) by mouth daily.   No facility-administered encounter medications on file as of 01/02/2024.    Allergies (verified) Ketorolac, Prednisone, and Nsaids   History: Past Medical History:  Diagnosis Date   Abnormal liver enzymes 09/03/2014   Acid reflux    Allergy    Anemia    H/O   Anxiety    Arthritis    Cataract    COPD (chronic obstructive pulmonary disease) (HCC)    Depression    Diabetes mellitus without complication (HCC)    Dyspnea    WITH EXERTION DUE TO COPD   Elevation of level of transaminase or lactic acid dehydrogenase (LDH) 08/27/2012   Fatty liver    Headache    H/O  MIGRAINES   High blood pressure    High cholesterol    Sleep apnea    BIPAP   Past Surgical History:  Procedure Laterality Date   BARIATRIC SURGERY  06/15/2017   CARDIAC CATHETERIZATION     COLONOSCOPY WITH PROPOFOL N/A 10/10/2018   Procedure: COLONOSCOPY WITH PROPOFOL;  Surgeon: Wyline Mood, MD;  Location: [facility] ENDOSCOPY;  Service: Gastroenterology;  Laterality: N/A;   FRACTURE SURGERY  1990   RIGHT LEG    KNEE SURGERY     REPLACEMENT TOTAL KNEE Left    TONSILLECTOMY     TOTAL ABDOMINAL HYSTERECTOMY W/ BILATERAL SALPINGOOPHORECTOMY     TOTAL KNEE ARTHROPLASTY Left 04/28/2020   Procedure: TOTAL KNEE ARTHROPLASTY;  Surgeon: Lyndle Herrlich, MD;  Location: [facility] ORS;  Service: Orthopedics;  Laterality: Left;   TUBAL LIGATION     Family History  Problem Relation Age of Onset   Cerebral aneurysm Mother    Stroke Father    Diabetes Father    Depression Sister    Obesity Sister    Kidney disease Sister    Heart attack Sister    Stroke Sister    Hypertension Sister    Diabetes Brother    High blood pressure Brother    Heart attack Maternal Grandfather    Hyperlipidemia Son    Stroke Maternal Grandmother    Diabetes Paternal Grandfather    Hyperlipidemia Paternal Grandfather    Heart disease Paternal Grandfather    Social History   Socioeconomic History   Marital status: Married    Spouse name: Not on file   Number of children: Not on file   Years of education: Not on file   Highest education level: Some college, no degree  Occupational History   Not on file  Tobacco Use   Smoking status: Never   Smokeless tobacco: Never  Vaping Use   Vaping status: Never Used  Substance and Sexual Activity   Alcohol use: Yes    Comment: occassional   Drug use: No   Sexual activity: Not Currently  Other Topics Concern   Not on file  Social History Narrative   Not on file   Social Drivers of Health   Financial Resource Strain: Medium Risk (10/29/2023)   Overall Financial  Resource Strain (CARDIA)    Difficulty of Paying Living Expenses: Somewhat hard  Food Insecurity: Food Insecurity Present (10/29/2023)   Hunger Vital Sign    Worried About Running Out of Food in the Last Year: Often true    Ran Out of Food in the Last Year: Often true  Transportation Needs: Unmet Transportation Needs (10/29/2023)   PRAPARE - Transportation    Lack of Transportation (Medical): Yes    Lack of  Transportation (Non-Medical): Yes  Physical Activity: Unknown (10/29/2023)   Exercise Vital Sign    Days of Exercise per Week: 0 days    Minutes of Exercise per Session: Not on file  Stress: Stress Concern Present (10/29/2023)   [employer] of Occupational Health - Occupational Stress Questionnaire    Feeling of Stress : Rather much  Social Connections: Moderately Isolated (10/29/2023)   Social Connection and Isolation Panel [NHANES]    Frequency of Communication with Friends and Family: Three times a week    Frequency of Social Gatherings with Friends and Family: Never    Attends Religious Services: Never    Database administrator or Organizations: No    Attends Engineer, structural: Not on file    Marital Status: Married    Tobacco Counseling Counseling given: Not Answered   Clinical Intake:  Pre-visit preparation completed: No  Pain : No/denies pain Pain Score: 0-No pain     BMI - recorded: 41.07 Nutritional Risks: None Diabetes: Yes CBG done?: No Did pt. bring in CBG monitor from home?: No (Does check at home but doesn't have records with her.)  How often do you need to have someone help you when you read instructions, pamphlets, or other written materials from your doctor or pharmacy?: 1 - Never  Interpreter Needed?: No      Activities of Daily Living    01/02/2024   10:30 AM  In your present state of health, do you have any difficulty performing the following activities:  Hearing? 0  Vision? 0  Difficulty concentrating or making decisions? 0   Walking or climbing stairs? 0  Dressing or bathing? 0  Doing errands, shopping? 0  Preparing Food and eating ? N  Using the Toilet? N  In the past six months, have you accidently leaked urine? N  Do you have problems with loss of bowel control? N  Managing your Medications? N  Managing your Finances? N  Housekeeping or managing your Housekeeping? N    Patient Care Team: Larae Grooms, NP as PCP - General Mariah Milling Tollie Pizza, MD as PCP - Cardiology (Cardiology)  Indicate any recent Medical Services you may have received from other than [facility] providers in the past year (date may be approximate).     Assessment:   This is a routine wellness examination for Angela Mullen.  Hearing/Vision screen No results found.   Goals Addressed             This Visit's Progress    Depressive Symptoms Identified       Evidence-based guidance:  Identify risk for depression by reviewing presenting symptoms and risk factors.  Review use of medications that contribute to depression such as steroid, narcotic, sedative, antihypertensive, beta blocker, cytoxic agent.  Review related metabolic processes, including infection, anemia, thyroid dysfunction, kidney failure, heart failure, alcohol or substance use.  Perform depression screening using standardized tools to obtain baseline intensity of depressive symptoms.  Perform or refer for a full diagnostic interview when positive screening results are noted; use DSM-5 criteria to determine appropriate diagnosis (e.g., major depression, persistent depressive disorder, unspecified depressive   disorder).   Notes:      DIET - INCREASE WATER INTAKE         Depression Screen    01/02/2024   10:22 AM 10/29/2023    2:11 PM 08/14/2023   10:55 AM 05/16/2023    8:55 AM 12/11/2022    9:12 AM 09/11/2022    9:34 AM 03/22/2022  9:12 AM  PHQ 2/9 Scores  PHQ - 2 Score 2 6 4 5 2 2 4   PHQ- 9 Score 11 23 13 22 10 11 17     Fall Risk    01/02/2024   10:22 AM  05/16/2023    8:55 AM 12/11/2022    9:12 AM 09/11/2022    9:29 AM 03/22/2022    9:12 AM  Fall Risk   Falls in the past year? 0 1 0 0 0  Number falls in past yr: 0 1 0 0 0  Injury with Fall? 0 1 0 0 0  Risk for fall due to : No Fall Risks History of fall(s) No Fall Risks No Fall Risks No Fall Risks  Follow up Falls evaluation completed Falls evaluation completed Falls evaluation completed Falls evaluation completed Falls evaluation completed    MEDICARE RISK AT HOME: Medicare Risk at Home Any stairs in or around the home?: Yes If so, are there any without handrails?: Yes Home free of loose throw rugs in walkways, pet beds, electrical cords, etc?: No Adequate lighting in your home to reduce risk of falls?: Yes Life alert?: No Use of a cane, walker or w/c?: No Grab bars in the bathroom?: No Shower chair or bench in shower?: No Elevated toilet seat or a handicapped toilet?: No  TIMED UP AND GO:  Was the test performed?  Yes  Length of time to ambulate 10 feet: 8 sec Gait steady and fast without use of assistive device    Cognitive Function:    01/02/2024   10:35 AM  MMSE - Mini Mental State Exam  Orientation to time 5  Orientation to Place 5  Registration 3  Attention/ Calculation 5  Recall 3  Language- name 2 objects 2  Language- repeat 1  Language- follow 3 step command 3  Language- read & follow direction 1  Write a sentence 1  Copy design 1  Total score 30        Immunizations Immunization History  Administered Date(s) Administered   Fluad Quad(high Dose 65+) 09/11/2022   Fluad Trivalent(High Dose 65+) 08/14/2023   Hepatitis A 09/04/2012   Influenza Inj Mdck Quad With Preservative 06/25/2017   Influenza,inj,Quad PF,6+ Mos 06/04/2018, 07/28/2019   Influenza-Unspecified 09/05/2021   PFIZER(Purple Top)SARS-COV-2 Vaccination 11/07/2019, 11/28/2019, 09/15/2020   PNEUMOCOCCAL CONJUGATE-20 09/11/2022   Pfizer(Comirnaty)Fall Seasonal Vaccine 12 years and older  08/14/2023   Pneumococcal Polysaccharide-23 12/07/2016, 04/29/2020   Td 12/02/2020   Tdap 11/25/2010    TDAP status: Up to date  Flu Vaccine status: Up to date  Pneumococcal vaccine status: Up to date  Covid-19 vaccine status: Information provided on how to obtain vaccines.   Qualifies for Shingles Vaccine? Yes   Zostavax completed No   Shingrix Completed?: No.    Education has been provided regarding the importance of this vaccine. Patient has been advised to call insurance company to determine out of pocket expense if they have not yet received this vaccine. Advised may also receive vaccine at local pharmacy or Health Dept. Verbalized acceptance and understanding.  Screening Tests Health Maintenance  Topic Date Due   MAMMOGRAM  Never done   Zoster Vaccines- Shingrix (1 of 2) Never done   DEXA SCAN  Never done   OPHTHALMOLOGY EXAM  07/25/2023   FOOT EXAM  09/12/2023   HEMOGLOBIN A1C  02/11/2024   COVID-19 Vaccine (5 - 2024-25 season) 02/11/2024   INFLUENZA VACCINE  04/25/2024   Diabetic kidney evaluation - eGFR measurement  08/13/2024  Diabetic kidney evaluation - Urine ACR  08/13/2024   Medicare Annual Wellness (AWV)  01/01/2025   Colonoscopy  10/10/2028   DTaP/Tdap/Td (3 - Td or Tdap) 12/03/2030   Pneumonia Vaccine 63+ Years old  Completed   Hepatitis C Screening  Completed   HPV VACCINES  Aged Out    Health Maintenance  Health Maintenance Due  Topic Date Due   MAMMOGRAM  Never done   Zoster Vaccines- Shingrix (1 of 2) Never done   DEXA SCAN  Never done   OPHTHALMOLOGY EXAM  07/25/2023   FOOT EXAM  09/12/2023    Colorectal cancer screening: Type of screening: Colonoscopy. Completed 10/10/2018. Repeat every 10 years  Mammogram Status: Scheduled for 01/08/2024. Repeat every 1 year.   Bone Density status: Ordered 01/02/2024. Pt provided with contact info and advised to call to schedule appt.  Lung Cancer Screening: (Low Dose CT Chest recommended if Age 28-80  years, 20 pack-year currently smoking OR have quit w/in 15years.) does not qualify.   Lung Cancer Screening Referral: N/A  Additional Screening:  Hepatitis C Screening: does qualify; Completed 08/07/2018  Vision Screening: Recommended annual ophthalmology exams for early detection of glaucoma and other disorders of the eye. Is the patient up to date with their annual eye exam?  No  Who is the provider or what is the name of the office in which the patient attends annual eye exams? [facility] If pt is not established with a provider, would they like to be referred to a provider to establish care?  N/A .   Dental Screening: Recommended annual dental exams for proper oral hygiene  Diabetic Foot Exam: Diabetic Foot Exam: Completed 01/02/2024  Community Resource Referral / Chronic Care Management: CRR required this visit?  No   CCM required this visit?  No     Plan:     I have personally reviewed and noted the following in the patient's chart:   Medical and social history Use of alcohol, tobacco or illicit drugs  Current medications and supplements including opioid prescriptions. Patient is not currently taking opioid prescriptions. Functional ability and status Nutritional status Physical activity Advanced directives List of other physicians Hospitalizations, surgeries, and ER visits in previous 12 months Vitals Screenings to include cognitive, depression, and falls Referrals and appointments  In addition, I have reviewed and discussed with patient certain preventive protocols, quality metrics, and best practice recommendations. A written personalized care plan for preventive services as well as general preventive health recommendations were provided to patient.     Angela Mullen, CMA   01/02/2024   After Visit Summary: (In Person-Printed) AVS printed and given to the patient  Nurse Notes: Discussed with patient scheduling with Felecia Jan, LCSW-  [facility] [phone]

## 2024-01-02 NOTE — Patient Instructions (Addendum)
 Please reach out to Felecia Jan, LCSW at [facility] [phone]. New address is [address], [place], [place] [address].
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
## 2024-01-02 NOTE — Assessment & Plan Note (Signed)
 Chronic, controlled.  Most recent A1c was 5.6%-recheck today-results to dictate further management Doing well with Ozempic.  Increased to 0.5mg  weekly to help with weight management.  Patient receives this via PAP.  Will likely need to increase to 1mg  at next visit.  Follow-up in 3 months or sooner if concerns arise

## 2024-01-02 NOTE — Assessment & Plan Note (Signed)
 Chronic.  Not well controlled.  Has struggled with depression and anxiety for several years.  Did well with Rexulti but not able to get it due to insurance cost.  Will increase Abilify to 5mg  daily.  Can consider increasing Wellbutrin to 450mg  daily if needed.  Follow up in 1 month.  Call sooner if concerns arise.

## 2024-01-02 NOTE — Assessment & Plan Note (Signed)
 Chronic, Controlled.  Continue with GLP1. She is currently estimated to be in stage IIIb CKD and is followed by nephrology Her most recent appointment was in April 2024 Reviewed most recent notes-she is advised to avoid nephrotoxic, nephro taxing medications, encouraged to keep diabetes well-managed and stay well-hydrated Nephrology also advised appropriate dietary management Will labs requested by Nephrology.  Continue collaboration with nephrology Follow-up 3 months or sooner if concerns arise

## 2024-01-03 ENCOUNTER — Encounter: Payer: Self-pay | Admitting: Nurse Practitioner

## 2024-01-03 LAB — LIPID PANEL
Chol/HDL Ratio: 2 ratio (ref 0.0–4.4)
Cholesterol, Total: 125 mg/dL (ref 100–199)
HDL: 61 mg/dL (ref >39)
LDL Chol Calc (NIH): 50 mg/dL (ref 0–99)
Triglycerides: 67 mg/dL (ref 0–149)
VLDL Cholesterol Cal: 14 mg/dL (ref 5–40)

## 2024-01-03 LAB — RENAL FUNCTION PANEL: Phosphorus: 4 mg/dL (ref 3.0–4.3)

## 2024-01-03 LAB — COMPREHENSIVE METABOLIC PANEL WITH GFR
ALT: 26 IU/L (ref 0–32)
AST: 30 IU/L (ref 0–40)
Albumin: 4 g/dL (ref 3.9–4.9)
Alkaline Phosphatase: 121 IU/L (ref 44–121)
BUN/Creatinine Ratio: 10 — ABNORMAL LOW (ref 12–28)
BUN: 18 mg/dL (ref 8–27)
Bilirubin Total: <0.2 mg/dL (ref 0.0–1.2)
CO2: 18 mmol/L — ABNORMAL LOW (ref 20–29)
Calcium: 9 mg/dL (ref 8.7–10.3)
Chloride: 111 mmol/L — ABNORMAL HIGH (ref 96–106)
Creatinine, Ser: 1.78 mg/dL — ABNORMAL HIGH (ref 0.57–1.00)
Globulin, Total: 2.1 g/dL (ref 1.5–4.5)
Glucose: 91 mg/dL (ref 70–99)
Potassium: 4.1 mmol/L (ref 3.5–5.2)
Sodium: 144 mmol/L (ref 134–144)
Total Protein: 6.1 g/dL (ref 6.0–8.5)
eGFR: 31 mL/min/{1.73_m2} — ABNORMAL LOW (ref >59)

## 2024-01-03 LAB — CBC WITH DIFFERENTIAL/PLATELET
Basophils Absolute: 0.1 10*3/uL (ref 0.0–0.2)
Basos: 1 %
EOS (ABSOLUTE): 0.4 10*3/uL (ref 0.0–0.4)
Eos: 8 %
Hematocrit: 38.1 % (ref 34.0–46.6)
Hemoglobin: 12.3 g/dL (ref 11.1–15.9)
Immature Grans (Abs): 0 10*3/uL (ref 0.0–0.1)
Immature Granulocytes: 0 %
Lymphocytes Absolute: 0.7 10*3/uL (ref 0.7–3.1)
Lymphs: 14 %
MCH: 30.5 pg (ref 26.6–33.0)
MCHC: 32.3 g/dL (ref 31.5–35.7)
MCV: 95 fL (ref 79–97)
Monocytes Absolute: 0.5 10*3/uL (ref 0.1–0.9)
Monocytes: 10 %
Neutrophils Absolute: 3.3 10*3/uL (ref 1.4–7.0)
Neutrophils: 67 %
Platelets: 149 10*3/uL — ABNORMAL LOW (ref 150–450)
RBC: 4.03 x10E6/uL (ref 3.77–5.28)
RDW: 12.5 % (ref 11.7–15.4)
WBC: 5 10*3/uL (ref 3.4–10.8)

## 2024-01-03 LAB — HEMOGLOBIN A1C
Est. average glucose Bld gHb Est-mCnc: 100 mg/dL
Hgb A1c MFr Bld: 5.1 % (ref 4.8–5.6)

## 2024-01-03 LAB — URIC ACID: Uric Acid: 6.9 mg/dL (ref 3.0–7.2)

## 2024-01-03 LAB — TSH: TSH: 2 u[IU]/mL (ref 0.450–4.500)

## 2024-01-03 LAB — PTH, INTACT AND CALCIUM: PTH: 34 pg/mL (ref 15–65)

## 2024-01-08 ENCOUNTER — Encounter

## 2024-01-16 DIAGNOSIS — G629 Polyneuropathy, unspecified: Secondary | ICD-10-CM | POA: Diagnosis not present

## 2024-01-16 DIAGNOSIS — Z9884 Bariatric surgery status: Secondary | ICD-10-CM | POA: Diagnosis not present

## 2024-01-16 DIAGNOSIS — E663 Overweight: Secondary | ICD-10-CM | POA: Diagnosis not present

## 2024-01-16 DIAGNOSIS — I10 Essential (primary) hypertension: Secondary | ICD-10-CM | POA: Diagnosis not present

## 2024-01-16 DIAGNOSIS — R809 Proteinuria, unspecified: Secondary | ICD-10-CM | POA: Diagnosis not present

## 2024-01-16 DIAGNOSIS — E1122 Type 2 diabetes mellitus with diabetic chronic kidney disease: Secondary | ICD-10-CM | POA: Diagnosis not present

## 2024-01-16 DIAGNOSIS — E785 Hyperlipidemia, unspecified: Secondary | ICD-10-CM | POA: Diagnosis not present

## 2024-01-16 DIAGNOSIS — N1832 Chronic kidney disease, stage 3b: Secondary | ICD-10-CM | POA: Diagnosis not present

## 2024-01-16 DIAGNOSIS — M1000 Idiopathic gout, unspecified site: Secondary | ICD-10-CM | POA: Diagnosis not present

## 2024-01-17 DIAGNOSIS — G4733 Obstructive sleep apnea (adult) (pediatric): Secondary | ICD-10-CM | POA: Diagnosis not present

## 2024-01-28 ENCOUNTER — Encounter: Payer: Self-pay | Admitting: Nurse Practitioner

## 2024-01-28 DIAGNOSIS — H2513 Age-related nuclear cataract, bilateral: Secondary | ICD-10-CM | POA: Diagnosis not present

## 2024-01-28 DIAGNOSIS — E119 Type 2 diabetes mellitus without complications: Secondary | ICD-10-CM | POA: Diagnosis not present

## 2024-01-28 LAB — HM DIABETES EYE EXAM

## 2024-01-29 ENCOUNTER — Ambulatory Visit
Admission: RE | Admit: 2024-01-29 | Discharge: 2024-01-29 | Disposition: A | Discharge status: Home / Self Care | Source: Ambulatory Visit | Attending: Nurse Practitioner | Admitting: Nurse Practitioner

## 2024-01-29 DIAGNOSIS — Z1231 Encounter for screening mammogram for malignant neoplasm of breast: Secondary | ICD-10-CM | POA: Diagnosis not present

## 2024-02-04 ENCOUNTER — Ambulatory Visit: Admitting: Nurse Practitioner

## 2024-02-04 ENCOUNTER — Encounter: Payer: Self-pay | Admitting: Nurse Practitioner

## 2024-02-04 VITALS — BP 110/69 | HR 73 | Temp 97.8°F | Wt 244.6 lb

## 2024-02-04 DIAGNOSIS — E118 Type 2 diabetes mellitus with unspecified complications: Secondary | ICD-10-CM

## 2024-02-04 DIAGNOSIS — N1832 Chronic kidney disease, stage 3b: Secondary | ICD-10-CM

## 2024-02-04 DIAGNOSIS — F339 Major depressive disorder, recurrent, unspecified: Secondary | ICD-10-CM

## 2024-02-04 DIAGNOSIS — H1032 Unspecified acute conjunctivitis, left eye: Secondary | ICD-10-CM

## 2024-02-04 MED ORDER — POLYMYXIN B-TRIMETHOPRIM 10000-0.1 UNIT/ML-% OP SOLN
2.0000 [drp] | Freq: Two times a day (BID) | OPHTHALMIC | 0 refills | Status: DC
Start: 2024-02-04 — End: 2024-05-21

## 2024-02-04 NOTE — Assessment & Plan Note (Signed)
 Chronic.  Stable.  Reviewed recent note from Nephrology.

## 2024-02-04 NOTE — Progress Notes (Signed)
 BP 110/69   Pulse 73   Temp 97.8 F (36.6 C) (Oral)   Wt 244 lb 9.6 oz (110.9 kg)   SpO2 92%   BMI 41.96 kg/m    Subjective:    Patient ID: Angela Mullen, female    DOB: 1956/04/29, 68 y.o.   MRN: [id number]  HPI: Angela Mullen is a 67 y.o. female  Chief Complaint  Patient presents with   Anxiety   Eye Pain    Pt states feels like something is pulling   ANXIETY/DEPRESSION Patient states she feels like her medication is better than her.  She does feels like the Abilify  is helping her symptoms.  She is having more motivation.  She is gaining weight.  She is napping during the day.  She hasn't been able to get in touch with her therapist.  Denies SI.   Patient states she hit her left eye on Saturday with her mascara.  States she is having redness, drainage, and pain.    DIABETES Doing well with Ozempic .  Has had some weight gain with the Abilify .  Okay with increasing the dose of her medication to help combat the weight gain.    Relevant past medical, surgical, family and social history reviewed and updated as indicated. Interim medical history since our last visit reviewed. Allergies and medications reviewed and updated.  Review of Systems  Eyes:  Positive for pain, discharge and redness.  Psychiatric/Behavioral:  Positive for dysphoric mood. Negative for suicidal ideas. The patient is nervous/anxious.     Per HPI unless specifically indicated above     Objective:     BP 110/69   Pulse 73   Temp 97.8 F (36.6 C) (Oral)   Wt 244 lb 9.6 oz (110.9 kg)   SpO2 92%   BMI 41.96 kg/m   Wt Readings from Last 3 Encounters:  02/04/24 244 lb 9.6 oz (110.9 kg)  01/02/24 239 lb 6.4 oz (108.6 kg)  10/31/23 243 lb 6.4 oz (110.4 kg)    Physical Exam Vitals and nursing note reviewed.  Constitutional:      General: She is not in acute distress.    Appearance: Normal appearance. She is normal weight. She is not ill-appearing, toxic-appearing or diaphoretic.  HENT:      Head: Normocephalic.     Right Ear: External ear normal.     Left Ear: External ear normal.     Nose: Nose normal.     Mouth/Throat:     Mouth: Mucous membranes are moist.     Pharynx: Oropharynx is clear.  Eyes:     General:        Right eye: No discharge.        Left eye: No discharge.     Extraocular Movements: Extraocular movements intact.     Conjunctiva/sclera: Conjunctivae normal.     Left eye: Exudate present.     Pupils: Pupils are equal, round, and reactive to light.     Comments: Redness and irritation of left eye.  Cardiovascular:     Rate and Rhythm: Normal rate and regular rhythm.     Heart sounds: No murmur heard. Pulmonary:     Effort: Pulmonary effort is normal. No respiratory distress.     Breath sounds: Normal breath sounds. No wheezing or rales.  Musculoskeletal:     Cervical back: Normal range of motion and neck supple.  Skin:    General: Skin is warm and dry.     Capillary Refill:  Capillary refill takes less than 2 seconds.  Neurological:     General: No focal deficit present.     Mental Status: She is alert and oriented to person, place, and time. Mental status is at baseline.  Psychiatric:        Mood and Affect: Mood normal.        Behavior: Behavior normal.        Thought Content: Thought content normal.        Judgment: Judgment normal.     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  Will increase Ozempic  to 1mg  to help combat the weight gain with Abilify .  Follow up in 2 months.  Call sooner if concerns arise.         Genitourinary   Chronic kidney disease (CKD), stage III (moderate) (HCC)   Chronic.  Stable.  Reviewed  recent note from Nephrology.         Other   Depression, recurrent (HCC)   Chronic.  Improved with Abilify  5mg .  Continue with current dose at this time.  Follow up in 2 months.  Call sooner if concerns arise.       Other Visit Diagnoses       Acute bacterial conjunctivitis of left eye       Will treat with polymyxin drops.  Complete course of treatment.  Follow up if not improved.        Follow up plan: Return in about 2 months (around 04/05/2024) for HTN, HLD, DM2 FU.

## 2024-02-04 NOTE — Assessment & Plan Note (Addendum)
 Chronic.  Improved with Abilify  5mg .  Continue with current dose at this time.  Follow up in 2 months.  Call sooner if concerns arise.

## 2024-02-04 NOTE — Assessment & Plan Note (Addendum)
 Chronic.  Will increase Ozempic  to 1mg  to help combat the weight gain with Abilify .  Follow up in 2 months.  Call sooner if concerns arise.

## 2024-02-11 ENCOUNTER — Telehealth: Payer: Self-pay

## 2024-02-11 NOTE — Telephone Encounter (Signed)
 Received provider portion PAP Novo nordisk (Ozempic ) faxed to company will follow up in a few days.

## 2024-02-11 NOTE — Telephone Encounter (Signed)
 Fax provider portion PAP Novo Nordisk increased to 1mg  Ozempic  refill change request.

## 2024-02-22 NOTE — Telephone Encounter (Signed)
 Following up on pt's PAP Novo Nordisk new refill change request to Ozempic  1 mg,spoke with [employer] representative said the change was done on 5/22 and have mail out the same date, will take 10-14 days for pt to received.left a HIPAA VM at pt number.

## 2024-02-25 ENCOUNTER — Other Ambulatory Visit: Payer: Self-pay | Admitting: Physician Assistant

## 2024-02-25 ENCOUNTER — Other Ambulatory Visit: Payer: Self-pay | Admitting: Nurse Practitioner

## 2024-02-26 NOTE — Telephone Encounter (Signed)
 Requested Prescriptions  Pending Prescriptions Disp Refills   traZODone  (DESYREL ) 100 MG tablet [Pharmacy Med Name: traZODone  HCl 100 MG Oral Tablet] 180 tablet 1    Sig: TAKE 2 TABLETS BY MOUTH AT BEDTIME AS NEEDED FOR SLEEP     Psychiatry: Antidepressants - Serotonin Modulator Passed - 02/26/2024  1:57 PM      Passed - Completed PHQ-2 or PHQ-9 in the last 360 days      Passed - Valid encounter within last 6 months    Recent Outpatient Visits           3 weeks ago DM (diabetes mellitus), type 2 with complications (HCC)   [facility] Aileen Alexanders, NP   1 month ago Encounter for annual wellness exam in Medicare patient   [place] [facility] Aileen Alexanders, NP   4 months ago Depression, recurrent [facility])   [place] [facility] Aileen Alexanders, NP

## 2024-02-26 NOTE — Telephone Encounter (Signed)
 Cr in date.   Requested Prescriptions  Pending Prescriptions Disp Refills   DULoxetine  (CYMBALTA ) 60 MG capsule [Pharmacy Med Name: DULoxetine  HCl 60 MG Oral Capsule Delayed Release Particles] 90 capsule 1    Sig: Take 1 capsule by mouth at bedtime     Psychiatry: Antidepressants - SNRI - duloxetine  Failed - 02/26/2024  1:59 PM      Failed - Cr in normal range and within 360 days    Creatinine  Date Value Ref Range Status  05/11/2013 0.97 0.60 - 1.30 mg/dL Final   Creatinine, Ser  Date Value Ref Range Status  01/02/2024 1.78 (H) 0.57 - 1.00 mg/dL Final         Passed - eGFR is 30 or above and within 360 days    EGFR (African American)  Date Value Ref Range Status  05/11/2013 >60  Final   GFR calc Af Amer  Date Value Ref Range Status  04/29/2020 46 (L) >60 mL/min Final   EGFR (Non-African Amer.)  Date Value Ref Range Status  05/11/2013 >60  Final    Comment:    eGFR values <68mL/min/1.73 m2 may be an indication of chronic kidney disease (CKD). Calculated eGFR is useful in patients with stable renal function. The eGFR calculation will not be reliable in acutely ill patients when serum creatinine is changing rapidly. It is not useful in  patients on dialysis. The eGFR calculation may not be applicable to patients at the low and high extremes of body sizes, pregnant women, and vegetarians.    GFR calc non Af Amer  Date Value Ref Range Status  04/29/2020 40 (L) >60 mL/min Final   eGFR  Date Value Ref Range Status  01/02/2024 31 (L) >59 mL/min/1.73 Final         Passed - Completed PHQ-2 or PHQ-9 in the last 360 days      Passed - Last BP in normal range    BP Readings from Last 1 Encounters:  02/04/24 110/69         Passed - Valid encounter within last 6 months    Recent Outpatient Visits           3 weeks ago DM (diabetes mellitus), type 2 with complications [facility])   [place] [facility] Aileen Alexanders, NP   1 month ago Encounter for annual  wellness exam in Medicare patient   [place] Aileen Alexanders, NP   4 months ago Depression, recurrent [facility])   [place] [facility] Aileen Alexanders, NP

## 2024-03-03 ENCOUNTER — Telehealth: Payer: Self-pay

## 2024-03-03 NOTE — Progress Notes (Signed)
   03/03/2024  Patient ID: Angela Mullen, female   DOB: 1956/02/01, 68 y.o.   MRN: [id number]  Outreach attempt to return missed call/voicemail from patient earlier today.  I was not able to reach the patient, and her voicemail is currently full.  Sending a MyChart message to attempt to contact her and address any need she may have.  Linn Rich, PharmD, DPLA

## 2024-03-09 ENCOUNTER — Other Ambulatory Visit: Payer: Self-pay | Admitting: Nurse Practitioner

## 2024-03-11 ENCOUNTER — Ambulatory Visit

## 2024-03-11 NOTE — Telephone Encounter (Signed)
 Requested medications are due for refill today.  yes  Requested medications are on the active medications list.  yes  Last refill. 12/03/2023 30g 0 rf  Future visit scheduled.   yes  Notes to clinic.  Medication not assigned to a protocol.    Requested Prescriptions  Pending Prescriptions Disp Refills   clotrimazole -betamethasone  (LOTRISONE ) cream [Pharmacy Med Name: Clotrimazole -Betamethasone  1-0.05 % External Cream] 30 g 0    Sig: APPLY CREAM TOPICALLY 2 TIMES A DAY AS NEEDED     Off-Protocol Failed - 03/11/2024  5:08 PM      Failed - Medication not assigned to a protocol, review manually.      Passed - Valid encounter within last 12 months    Recent Outpatient Visits           1 month ago DM (diabetes mellitus), type 2 with complications [facility])   [facility] Aileen Alexanders, NP   2 months ago Encounter for annual wellness exam in Medicare patient   [place] [facility] A[facility]en Alexanders, NP   4 months ago Depression, recurrent [facility])   [place] [facility] Aileen Alexanders, NP

## 2024-03-12 ENCOUNTER — Telehealth: Payer: Self-pay

## 2024-03-12 DIAGNOSIS — G4733 Obstructive sleep apnea (adult) (pediatric): Secondary | ICD-10-CM | POA: Diagnosis not present

## 2024-03-12 NOTE — Telephone Encounter (Signed)
 Ok for E2C2 to review.  Call to patient to inform that her Novo Nordisk Ozempic  medication has been received in the office. Patient agrees to pickup ASAP.

## 2024-03-17 NOTE — Telephone Encounter (Signed)
 Attempted x 2 to reach patient however only rings busy. If patient calls regarding medication pick up in office please let her know call on 03/12/2024 was made in error and the last shipment she picked up was in fact the last we received.

## 2024-04-08 ENCOUNTER — Ambulatory Visit: Admitting: Nurse Practitioner

## 2024-04-17 ENCOUNTER — Ambulatory Visit: Admit: 2024-04-17 | Admitting: Ophthalmology

## 2024-04-17 SURGERY — PHACOEMULSIFICATION, CATARACT, WITH IOL INSERTION
Anesthesia: Topical | Laterality: Right

## 2024-04-29 ENCOUNTER — Ambulatory Visit: Admitting: Nurse Practitioner

## 2024-05-01 ENCOUNTER — Ambulatory Visit: Admit: 2024-05-01 | Admitting: Ophthalmology

## 2024-05-01 SURGERY — PHACOEMULSIFICATION, CATARACT, WITH IOL INSERTION
Anesthesia: Topical | Laterality: Left

## 2024-05-21 ENCOUNTER — Ambulatory Visit (INDEPENDENT_AMBULATORY_CARE_PROVIDER_SITE_OTHER): Admitting: Nurse Practitioner

## 2024-05-21 ENCOUNTER — Other Ambulatory Visit: Payer: Self-pay | Admitting: Cardiovascular Disease

## 2024-05-21 VITALS — BP 139/75 | HR 64 | Temp 98.2°F | Resp 15 | Ht 64.02 in | Wt 244.0 lb

## 2024-05-21 DIAGNOSIS — E118 Type 2 diabetes mellitus with unspecified complications: Secondary | ICD-10-CM

## 2024-05-21 DIAGNOSIS — E1169 Type 2 diabetes mellitus with other specified complication: Secondary | ICD-10-CM

## 2024-05-21 DIAGNOSIS — J449 Chronic obstructive pulmonary disease, unspecified: Secondary | ICD-10-CM | POA: Diagnosis not present

## 2024-05-21 DIAGNOSIS — N1832 Chronic kidney disease, stage 3b: Secondary | ICD-10-CM | POA: Diagnosis not present

## 2024-05-21 DIAGNOSIS — D692 Other nonthrombocytopenic purpura: Secondary | ICD-10-CM | POA: Diagnosis not present

## 2024-05-21 DIAGNOSIS — E785 Hyperlipidemia, unspecified: Secondary | ICD-10-CM

## 2024-05-21 DIAGNOSIS — I10 Essential (primary) hypertension: Secondary | ICD-10-CM | POA: Diagnosis not present

## 2024-05-21 DIAGNOSIS — F339 Major depressive disorder, recurrent, unspecified: Secondary | ICD-10-CM | POA: Diagnosis not present

## 2024-05-21 MED ORDER — METHOCARBAMOL 750 MG PO TABS: 750.0000 mg | ORAL_TABLET | Freq: Three times a day (TID) | ORAL | 0 refills | Status: AC | PRN

## 2024-05-21 MED ORDER — ALBUTEROL SULFATE HFA 108 (90 BASE) MCG/ACT IN AERS: 1.0000 | INHALATION_SPRAY | Freq: Four times a day (QID) | RESPIRATORY_TRACT | 3 refills | Status: AC | PRN

## 2024-05-21 NOTE — Assessment & Plan Note (Signed)
 Chronic.  Last A1c was 5.1%.  Continue with Ozempic  to 1mg  to help combat the weight gain with Abilify .  Continue with weight loss.  On ACE and Statin.  Eye exam up to date.  Follow up in 6 months.  Call sooner if concerns arise.

## 2024-05-21 NOTE — Assessment & Plan Note (Signed)
 Chronic.  Stable.  Reviewed recent note from Nephrology.  Encouraged to continue with weight loss.

## 2024-05-21 NOTE — Assessment & Plan Note (Signed)
 Reassured patient.

## 2024-05-21 NOTE — Assessment & Plan Note (Signed)
 Chronic, controlled.  Appears well-controlled on current regimen comprised of lovastatin  40 mg p.o. daily Recheck lipid panel today, results to dictate further management Continue current regimen for now Follow-up in 6 months or sooner if concerns arise

## 2024-05-21 NOTE — Assessment & Plan Note (Signed)
 Chronic, Elevated at visit today but patient is worked up.  Will recheck at next visit and adjust medications at that time if needed. Appears well-managed today with current regimen comprised of lisinopril  20 mg p.o. daily, amlodipine  5 mg p.o. daily, atenolol  50 mg p.o. daily Continue current regimen Follow-up in 6 months or sooner if concerns arise

## 2024-05-21 NOTE — Assessment & Plan Note (Signed)
 Recommended eating smaller high protein, low fat meals more frequently and exercising 30 mins a day 5 times a week with a goal of 10-15lb weight loss in the next 3 months.  Patient has lost 9lbs since last visit.  Continue with Ozempic  1mg .

## 2024-05-21 NOTE — Assessment & Plan Note (Signed)
 Chronic.  Controlled.  Continue with current medication regimen of Abilify , duloxetine , and Wellbutrin .  Labs ordered today.  Return to clinic in 6 months for reevaluation.  Call sooner if concerns arise.

## 2024-05-21 NOTE — Progress Notes (Signed)
 BP 139/75 (BP Location: Left Arm, Patient Position: Sitting, Cuff Size: Large)   Pulse 64   Temp 98.2 F (36.8 C) (Oral)   Resp 15   Ht 5' 4.02 (1.626 m)   Wt 244 lb (110.7 kg)   SpO2 98%   BMI 41.86 kg/m    Subjective:    Patient ID: Angela Mullen, female    DOB: 10-28-1955, 68 y.o.   MRN: [id number]  HPI: Angela Mullen is a 68 y.o. female  Chief Complaint  Patient presents with   Follow-up    Feeling better overall.    ANXIETY/DEPRESSION Patient states she feels like her medication is working well for her.  States he mood has been a lot better.  She is taking Abilify , Wellbutrin , duloxetine .  She feels like these are good doses.  She is sleeping well.  Not having any grogginess. She does still struggle with her energy at times.  Denies SI.   Flowsheet Row Office Visit from 05/21/2024 in [facility]  PHQ-9 Total Score 8      05/21/2024    9:25 AM 02/04/2024    8:44 AM 01/02/2024   10:23 AM 10/29/2023    2:11 PM  GAD 7 : Generalized Anxiety Score  Nervous, Anxious, on Edge 1 1 1 3   Control/stop worrying 0 1 1 2   Worry too much - different things 1 1 1 3   Trouble relaxing 1 1 1 3   Restless 1 1 1 1   Easily annoyed or irritable 1 1 1 1   Afraid - awful might happen 0 1 1 1   Total GAD 7 Score 5 7 7 14   Anxiety Difficulty Somewhat difficult Very difficult Somewhat difficult     CHRONIC KIDNEY DISEASE Patient has been seeing the nephrologist for CKD. They discussed weight loss options to help control diabetes and CKD. Patient has changed her diet and lost some weight. She has a follow up coming up with Dr. Marcelino.  She has lost 9lbs since November.  CKD status: controlled Medications renally dose: yes Previous renal evaluation: yes Pneumovax:  Up to Date Influenza Vaccine:  Up to Date   HYPERTENSION Hypertension status: controlled  Satisfied with current treatment? no Duration of hypertension: years BP monitoring frequency:  not  checking BP range:  BP medication side effects:  no Medication compliance: excellent compliance Previous BP meds:atenolol , amlodipine , and lisinopril  Aspirin: no Recurrent headaches: no Visual changes: no Palpitations: no Dyspnea: no Chest pain: no Lower extremity edema: no Dizzy/lightheaded: no  DIABETES She is on Ozempic  and doing well with it. She has lost 9lbs since last visit.  Hypoglycemic episodes:no Polydipsia/polyuria: no Visual disturbance: no Chest pain: no Paresthesias: no Glucose Monitoring: no  Accucheck frequency: Not Checking  Fasting glucose:  Post prandial:  Evening:  Before meals: Taking Insulin ?: no  Long acting insulin :  Short acting insulin : Blood Pressure Monitoring: not checking Retinal Examination: Up to date Foot Exam: Up to Date Diabetic Education: Not Completed Pneumovax: Up to Date Influenza: Up to Date Aspirin: no   Denies HA, CP, SOB, dizziness, palpitations, visual changes, and lower extremity swelling. Denies polyuria and polydipsia.  She does need a refill on the methocarbamol  she uses it as needed for back pain.  Relevant past medical, surgical, family and social history reviewed and updated as indicated. Interim medical history since our last visit reviewed. Allergies and medications reviewed and updated.  Review of Systems  Eyes:  Negative for visual disturbance.  Respiratory:  Negative for cough, chest tightness and shortness of breath.   Cardiovascular:  Negative for chest pain, palpitations and leg swelling.  Endocrine: Negative for polydipsia and polyuria.  Musculoskeletal:  Positive for back pain.  Neurological:  Negative for dizziness and headaches.  Psychiatric/Behavioral:  Positive for dysphoric mood. Negative for suicidal ideas. The patient is nervous/anxious.     Per HPI unless specifically indicated above     Objective:    BP 139/75 (BP Location: Left Arm, Patient Position: Sitting, Cuff Size: Large)   Pulse  64   Temp 98.2 F (36.8 C) (Oral)   Resp 15   Ht 5' 4.02 (1.626 m)   Wt 244 lb (110.7 kg)   SpO2 98%   BMI 41.86 kg/m   Wt Readings from Last 3 Encounters:  05/21/24 244 lb (110.7 kg)  02/04/24 244 lb 9.6 oz (110.9 kg)  01/02/24 239 lb 6.4 oz (108.6 kg)    Physical Exam Vitals and nursing note reviewed.  Constitutional:      General: She is not in acute distress.    Appearance: Normal appearance. She is obese. She is not ill-appearing, toxic-appearing or diaphoretic.  HENT:     Head: Normocephalic.     Right Ear: External ear normal.     Left Ear: External ear normal.     Nose: Nose normal.     Mouth/Throat:     Mouth: Mucous membranes are moist.     Pharynx: Oropharynx is clear.  Eyes:     General:        Right eye: No discharge.        Left eye: No discharge.     Extraocular Movements: Extraocular movements intact.     Conjunctiva/sclera: Conjunctivae normal.     Pupils: Pupils are equal, round, and reactive to light.  Cardiovascular:     Rate and Rhythm: Normal rate and regular rhythm.     Heart sounds: No murmur heard. Pulmonary:     Effort: Pulmonary effort is normal. No respiratory distress.     Breath sounds: Normal breath sounds. No wheezing or rales.  Musculoskeletal:     Cervical back: Normal range of motion and neck supple.  Skin:    General: Skin is warm and dry.     Capillary Refill: Capillary refill takes less than 2 seconds.  Neurological:     General: No focal deficit present.     Mental Status: She is alert and oriented to person, place, and time. Mental status is at baseline.  Psychiatric:        Mood and Affect: Mood normal.        Behavior: Behavior normal.        Thought Content: Thought content normal.        Judgment: Judgment normal.     Results for orders placed or performed in visit on 01/28/24  HM DIABETES EYE EXAM   Collection Time: 01/28/24  2:41 PM  Result Value Ref Range   HM Diabetic Eye Exam No Retinopathy No Retinopathy       Assessment & Plan:   Problem List Items Addressed This Visit       Cardiovascular and Mediastinum   Essential (primary) hypertension   Chronic, Elevated at visit today but patient is worked up.  Will recheck at next visit and adjust medications at that time if needed. Appears well-managed today with current regimen comprised of lisinopril  20 mg p.o. daily, amlodipine  5 mg p.o. daily, atenolol  50 mg p.o. daily Continue  current regimen Follow-up in 6 months or sooner if concerns arise      Senile purpura (HCC) - Primary   Reassured patient.         Respiratory   Chronic obstructive pulmonary disease (HCC)   Chronic.  Controlled.  Continue with current medication regimen.  Labs ordered today.  Return to clinic in 6 months for reevaluation.  Call sooner if concerns arise.       Relevant Medications   albuterol  (VENTOLIN  HFA) 108 (90 Base) MCG/ACT inhaler     Endocrine   Hyperlipidemia associated with type 2 diabetes mellitus (HCC)   Chronic, controlled.  Appears well-controlled on current regimen comprised of lovastatin  40 mg p.o. daily Recheck lipid panel today, results to dictate further management Continue current regimen for now Follow-up in 6 months or sooner if concerns arise      Relevant Medications   Semaglutide ,0.25 or 0.5MG /DOS, (OZEMPIC , 0.25 OR 0.5 MG/DOSE,) 2 MG/3ML SOPN   Other Relevant Orders   Lipid panel   DM (diabetes mellitus), type 2 with complications (HCC)   Chronic.  Last A1c was 5.1%.  Continue with Ozempic  to 1mg  to help combat the weight gain with Abilify .  Continue with weight loss.  On ACE and Statin.  Eye exam up to date.  Follow up in 6 months.  Call sooner if concerns arise.       Relevant Medications   Semaglutide ,0.25 or 0.5MG /DOS, (OZEMPIC , 0.25 OR 0.5 MG/DOSE,) 2 MG/3ML SOPN   Other Relevant Orders   Comprehensive metabolic panel with GFR   Hemoglobin A1c     Genitourinary   Chronic kidney disease (CKD), stage III (moderate)  (HCC)   Chronic.  Stable.  Reviewed recent note from Nephrology.  Encouraged to continue with weight loss.         Other   Morbid obesity with body mass index of 40.0-49.9 (HCC)   Recommended eating smaller high protein, low fat meals more frequently and exercising 30 mins a day 5 times a week with a goal of 10-15lb weight loss in the next 3 months.  Patient has lost 9lbs since last visit.  Continue with Ozempic  1mg .      Relevant Medications   Semaglutide ,0.25 or 0.5MG /DOS, (OZEMPIC , 0.25 OR 0.5 MG/DOSE,) 2 MG/3ML SOPN   Depression, recurrent (HCC)   Chronic.  Controlled.  Continue with current medication regimen of Abilify , duloxetine , and Wellbutrin .  Labs ordered today.  Return to clinic in 6 months for reevaluation.  Call sooner if concerns arise.          Follow up plan: Return in about 6 months (around 11/21/2024) for Physical and Fasting labs.

## 2024-05-21 NOTE — Assessment & Plan Note (Signed)
 Chronic.  Controlled.  Continue with current medication regimen.  Labs ordered today.  Return to clinic in 6 months for reevaluation.  Call sooner if concerns arise.  ? ?

## 2024-05-22 ENCOUNTER — Other Ambulatory Visit: Payer: Self-pay | Admitting: Cardiovascular Disease

## 2024-05-22 ENCOUNTER — Ambulatory Visit: Payer: Self-pay | Admitting: Nurse Practitioner

## 2024-05-22 DIAGNOSIS — E782 Mixed hyperlipidemia: Secondary | ICD-10-CM

## 2024-05-22 LAB — LIPID PANEL
Chol/HDL Ratio: 2.4 ratio (ref 0.0–4.4)
Cholesterol, Total: 129 mg/dL (ref 100–199)
HDL: 53 mg/dL (ref >39)
LDL Chol Calc (NIH): 62 mg/dL (ref 0–99)
Triglycerides: 65 mg/dL (ref 0–149)
VLDL Cholesterol Cal: 14 mg/dL (ref 5–40)

## 2024-05-22 LAB — HEMOGLOBIN A1C
Est. average glucose Bld gHb Est-mCnc: 108 mg/dL
Hgb A1c MFr Bld: 5.4 % (ref 4.8–5.6)

## 2024-05-22 LAB — COMPREHENSIVE METABOLIC PANEL WITH GFR
ALT: 17 IU/L (ref 0–32)
AST: 20 IU/L (ref 0–40)
Albumin: 3.9 g/dL (ref 3.9–4.9)
Alkaline Phosphatase: 110 IU/L (ref 44–121)
BUN/Creatinine Ratio: 13 (ref 12–28)
BUN: 22 mg/dL (ref 8–27)
Bilirubin Total: 0.3 mg/dL (ref 0.0–1.2)
CO2: 21 mmol/L (ref 20–29)
Calcium: 8.7 mg/dL (ref 8.7–10.3)
Chloride: 108 mmol/L — ABNORMAL HIGH (ref 96–106)
Creatinine, Ser: 1.69 mg/dL — ABNORMAL HIGH (ref 0.57–1.00)
Globulin, Total: 1.7 g/dL (ref 1.5–4.5)
Glucose: 66 mg/dL — ABNORMAL LOW (ref 70–99)
Potassium: 3.7 mmol/L (ref 3.5–5.2)
Sodium: 142 mmol/L (ref 134–144)
Total Protein: 5.6 g/dL — ABNORMAL LOW (ref 6.0–8.5)
eGFR: 33 mL/min/1.73 — ABNORMAL LOW (ref >59)

## 2024-05-26 ENCOUNTER — Other Ambulatory Visit: Payer: Self-pay | Admitting: Nurse Practitioner

## 2024-05-26 ENCOUNTER — Other Ambulatory Visit: Payer: Self-pay | Admitting: Physician Assistant

## 2024-05-27 ENCOUNTER — Telehealth: Payer: Self-pay

## 2024-05-27 ENCOUNTER — Other Ambulatory Visit (HOSPITAL_COMMUNITY): Payer: Self-pay

## 2024-05-27 NOTE — Telephone Encounter (Signed)
 Pharmacy Patient Advocate Encounter   Received notification from Onbase that prior authorization for [employer]  is required/requested.   Insurance verification completed.   The patient is insured through [facility] [employer]ANT[employer]/RX ADVANCE .   Per test claim: The current 30 day co-pay is, $479.66.  No PA needed at this time. This test claim was processed through [facility]- copay amounts may vary at other pharmacies due to pharmacy/plan contracts, or as the patient moves through the different stages of their insurance plan.

## 2024-05-27 NOTE — Telephone Encounter (Signed)
 Requested medications are due for refill today.  yes  Requested medications are on the active medications list.  yes  Last refill. 11/21/2023 #180 1 rf  Future visit scheduled.   yes  Notes to clinic.  Refill not delegated.    Requested Prescriptions  Pending Prescriptions Disp Refills   pregabalin  (LYRICA ) 150 MG capsule [Pharmacy Med Name: Pregabalin  150 MG Oral Capsule] 180 capsule 0    Sig: Take 2 capsules by mouth once daily     Not Delegated - Neurology:  Anticonvulsants - Controlled - pregabalin  Failed - 05/27/2024  2:59 PM      Failed - This refill cannot be delegated      Failed - Cr in normal range and within 360 days    Creatinine  Date Value Ref Range Status  05/11/2013 0.97 0.60 - 1.30 mg/dL Final   Creatinine, Ser  Date Value Ref Range Status  05/21/2024 1.69 (H) 0.57 - 1.00 mg/dL Final         Passed - Completed PHQ-2 or PHQ-9 in the last 360 days      Passed - Valid encounter within last 12 months    Recent Outpatient Visits           6 days ago Senile purpura [facility])   [facility] Melvin Pao, NP   3 months ago DM (diabetes mellitus), type 2 with complications [facility])   [facility] Melvin Pao, NP   4 months ago Encounter for annual wellness exam in Medicare patient    [facility] Melvin Pa[facility], NP   7 months ago Depression, recurrent [facility])    [facility] Melvin Pao, NP

## 2024-05-27 NOTE — Telephone Encounter (Signed)
 Requested Prescriptions  Pending Prescriptions Disp Refills   DULoxetine  (CYMBALTA ) 30 MG capsule [Pharmacy Med Name: DULoxetine  HCl 30 MG Oral Capsule Delayed Release Particles] 90 capsule 0    Sig: Take 1 capsule by mouth at bedtime     Psychiatry: Antidepressants - SNRI - duloxetine  Failed - 05/27/2024  2:59 PM      Failed - Cr in normal range and within 360 days    Creatinine  Date Value Ref Range Status  05/11/2013 0.97 0.60 - 1.30 mg/dL Final   Creatinine, Ser  Date Value Ref Range Status  05/21/2024 1.69 (H) 0.57 - 1.00 mg/dL Final         Passed - eGFR is 30 or above and within 360 days    EGFR (African American)  Date Value Ref Range Status  05/11/2013 >60  Final   GFR calc Af Amer  Date Value Ref Range Status  04/29/2020 46 (L) >60 mL/min Final   EGFR (Non-African Amer.)  Date Value Ref Range Status  05/11/2013 >60  Final    Comment:    eGFR values <58mL/min/1.73 m2 may be an indication of chronic kidney disease (CKD). Calculated eGFR is useful in patients with stable renal function. The eGFR calculation will not be reliable in acutely ill patients when serum creatinine is changing rapidly. It is not useful in  patients on dialysis. The eGFR calculation may not be applicable to patients at the low and high extremes of body sizes, pregnant women, and vegetarians.    GFR calc non Af Amer  Date Value Ref Range Status  04/29/2020 40 (L) >60 mL/min Final   eGFR  Date Value Ref Range Status  05/21/2024 33 (L) >59 mL/min/1.73 Final         Passed - Completed PHQ-2 or PHQ-9 in the last 360 days      Passed - Last BP in normal range    BP Readings from Last 1 Encounters:  05/21/24 139/75         Passed - Valid encounter within last 6 months    Recent Outpatient Visits           6 days ago Senile purpura [facility])   [place] [facility] Melvin Pao, NP   3 months ago DM (diabetes mellitus), type 2 with complications [facility])   [place]  [facility] Melvin Pao, NP   4 months ago Encounter for annual wellness exam in Medicare patient   [place] [facility] Melvin Pao, NP   7 months ago Depression, recurrent [facility])    [facility] Melvin Pao, NP

## 2024-06-02 DIAGNOSIS — H18523 Epithelial (juvenile) corneal dystrophy, bilateral: Secondary | ICD-10-CM | POA: Diagnosis not present

## 2024-06-02 DIAGNOSIS — H2513 Age-related nuclear cataract, bilateral: Secondary | ICD-10-CM | POA: Diagnosis not present

## 2024-06-12 DIAGNOSIS — G4733 Obstructive sleep apnea (adult) (pediatric): Secondary | ICD-10-CM | POA: Diagnosis not present

## 2024-06-18 DIAGNOSIS — H2511 Age-related nuclear cataract, right eye: Secondary | ICD-10-CM | POA: Diagnosis not present

## 2024-06-18 DIAGNOSIS — H2513 Age-related nuclear cataract, bilateral: Secondary | ICD-10-CM | POA: Diagnosis not present

## 2024-06-27 ENCOUNTER — Encounter: Payer: Self-pay | Admitting: Ophthalmology

## 2024-06-27 NOTE — Anesthesia Preprocedure Evaluation (Addendum)
 Anesthesia Evaluation  Patient identified by MRN, date of birth, ID band Patient awake    Reviewed: Allergy & Precautions, H&P , NPO status , Patient's Chart, lab work & pertinent test results  Airway Mallampati: IV  TM Distance: <3 FB Neck ROM: Full  Mouth opening: Limited Mouth Opening  Dental no notable dental hx.    Pulmonary neg pulmonary ROS, shortness of breath, sleep apnea , COPD   Pulmonary exam normal breath sounds clear to auscultation       Cardiovascular hypertension, + Peripheral Vascular Disease  Normal cardiovascular exam Rhythm:Regular Rate:Normal  10-18-20 echo    1. Left ventricular ejection fraction, by estimation, is 55 to 60%. The  left ventricle has normal function. The left ventricle has no regional  wall motion abnormalities. Left ventricular diastolic parameters are  consistent with Grade I diastolic  dysfunction (impaired relaxation). The average left ventricular global  longitudinal strain is -19.7 %. The global longitudinal strain is normal.   2. Right ventricular systolic function is normal. The right ventricular  size is normal. There is normal pulmonary artery systolic pressure.   3. Left atrial size was mildly dilated.   4. The mitral valve is normal in structure. No evidence of mitral valve  regurgitation. No evidence of mitral stenosis.   5. The aortic valve has an indeterminant number of cusps but likely  tricuspid. Aortic valve regurgitation is mild. Mild to moderate aortic  valve sclerosis/calcification is present, without any evidence of aortic  stenosis.   6. The inferior vena cava is normal in size with greater than 50%  respiratory variability, suggesting right atrial pressure of 3 mmHg.   Comparison(s): Prior Echo (Care Everywhere) showed LV EF 55%, normal LV  and RV function, mild AI, mild AS (mean gradient 13 mmHg).     Neuro/Psych  Headaches PSYCHIATRIC DISORDERS Anxiety  Depression     Neuromuscular disease negative neurological ROS  negative psych ROS   GI/Hepatic negative GI ROS, Neg liver ROS,GERD  ,,(+) Hepatitis -  Endo/Other  diabetes    Renal/GU Renal diseasenegative Renal ROS  negative genitourinary   Musculoskeletal negative musculoskeletal ROS (+) Arthritis ,    Abdominal   Peds negative pediatric ROS (+)  Hematology negative hematology ROS (+) Blood dyscrasia, anemia   Anesthesia Other Findings Very anxious, received versed  2 mg IV preop  High blood pressure  Fatty liver High cholesterol  Acid reflux COPD (chronic obstructive pulmonary disease) (HCC)  Elevation of level of transaminase or lactic acid dehydrogenase (LDH) Abnormal liver enzymes  Allergy Anxiety  Arthritis Cataract  Depression Sleep apnea on CPAP  Dyspnea Diabetes mellitus without complication Headache Anemia  Morbid obesity with body mass index (BMI) of 40.0 to 49.9 (HCC) Stage 3b chronic kidney disease (CKD) Grade I diastolic dysfunction    Reproductive/Obstetrics negative OB ROS                              Anesthesia Physical Anesthesia Plan  ASA: 3  Anesthesia Plan:    Post-op Pain Management:    Induction:   PONV Risk Score and Plan:   Airway Management Planned:   Additional Equipment:   Intra-op Plan:   Post-operative Plan:   Informed Consent:   Plan Discussed with:   Anesthesia Plan Comments:          Anesthesia Quick Evaluation

## 2024-07-01 ENCOUNTER — Other Ambulatory Visit: Payer: Self-pay | Admitting: Nurse Practitioner

## 2024-07-01 ENCOUNTER — Encounter: Payer: Self-pay | Admitting: Ophthalmology

## 2024-07-01 NOTE — Discharge Instructions (Signed)

## 2024-07-03 ENCOUNTER — Encounter
Admission: RE | Disposition: A | Discharge status: Home / Self Care | Payer: Self-pay | Source: Home / Self Care | Attending: Ophthalmology

## 2024-07-03 ENCOUNTER — Encounter: Payer: Self-pay | Admitting: Ophthalmology

## 2024-07-03 ENCOUNTER — Ambulatory Visit: Payer: Self-pay | Admitting: Anesthesiology

## 2024-07-03 ENCOUNTER — Other Ambulatory Visit: Payer: Self-pay

## 2024-07-03 ENCOUNTER — Ambulatory Visit
Admission: RE | Admit: 2024-07-03 | Discharge: 2024-07-03 | Disposition: A | Discharge status: Home / Self Care | Attending: Ophthalmology | Admitting: Ophthalmology

## 2024-07-03 DIAGNOSIS — H2512 Age-related nuclear cataract, left eye: Secondary | ICD-10-CM | POA: Diagnosis not present

## 2024-07-03 DIAGNOSIS — F32A Depression, unspecified: Secondary | ICD-10-CM | POA: Insufficient documentation

## 2024-07-03 DIAGNOSIS — I5032 Chronic diastolic (congestive) heart failure: Secondary | ICD-10-CM | POA: Insufficient documentation

## 2024-07-03 DIAGNOSIS — G4730 Sleep apnea, unspecified: Secondary | ICD-10-CM | POA: Diagnosis not present

## 2024-07-03 DIAGNOSIS — H2511 Age-related nuclear cataract, right eye: Secondary | ICD-10-CM | POA: Diagnosis not present

## 2024-07-03 DIAGNOSIS — I129 Hypertensive chronic kidney disease with stage 1 through stage 4 chronic kidney disease, or unspecified chronic kidney disease: Secondary | ICD-10-CM | POA: Diagnosis not present

## 2024-07-03 DIAGNOSIS — E1151 Type 2 diabetes mellitus with diabetic peripheral angiopathy without gangrene: Secondary | ICD-10-CM | POA: Diagnosis not present

## 2024-07-03 DIAGNOSIS — I130 Hypertensive heart and chronic kidney disease with heart failure and stage 1 through stage 4 chronic kidney disease, or unspecified chronic kidney disease: Secondary | ICD-10-CM | POA: Diagnosis not present

## 2024-07-03 DIAGNOSIS — E1122 Type 2 diabetes mellitus with diabetic chronic kidney disease: Secondary | ICD-10-CM | POA: Diagnosis not present

## 2024-07-03 DIAGNOSIS — E1136 Type 2 diabetes mellitus with diabetic cataract: Secondary | ICD-10-CM | POA: Diagnosis not present

## 2024-07-03 DIAGNOSIS — E785 Hyperlipidemia, unspecified: Secondary | ICD-10-CM | POA: Diagnosis not present

## 2024-07-03 DIAGNOSIS — F419 Anxiety disorder, unspecified: Secondary | ICD-10-CM | POA: Insufficient documentation

## 2024-07-03 DIAGNOSIS — D631 Anemia in chronic kidney disease: Secondary | ICD-10-CM | POA: Diagnosis not present

## 2024-07-03 DIAGNOSIS — J449 Chronic obstructive pulmonary disease, unspecified: Secondary | ICD-10-CM | POA: Insufficient documentation

## 2024-07-03 DIAGNOSIS — Z7985 Long-term (current) use of injectable non-insulin antidiabetic drugs: Secondary | ICD-10-CM | POA: Diagnosis not present

## 2024-07-03 DIAGNOSIS — N1832 Chronic kidney disease, stage 3b: Secondary | ICD-10-CM | POA: Insufficient documentation

## 2024-07-03 DIAGNOSIS — N183 Chronic kidney disease, stage 3 unspecified: Secondary | ICD-10-CM | POA: Diagnosis not present

## 2024-07-03 HISTORY — DX: Chronic kidney disease, stage 3b: N18.32

## 2024-07-03 HISTORY — DX: Other ill-defined heart diseases: I51.89

## 2024-07-03 HISTORY — DX: Morbid (severe) obesity due to excess calories: E66.01

## 2024-07-03 HISTORY — DX: Obstructive sleep apnea (adult) (pediatric): G47.33

## 2024-07-03 HISTORY — PX: CATARACT EXTRACTION W/PHACO: SHX586

## 2024-07-03 LAB — GLUCOSE, CAPILLARY
Glucose-Capillary: 79 mg/dL (ref 70–99)
Glucose-Capillary: 78 mg/dL (ref 70–99)

## 2024-07-03 SURGERY — PHACOEMULSIFICATION, CATARACT, WITH IOL INSERTION
Anesthesia: Topical | Site: Eye | Laterality: Right

## 2024-07-03 MED ORDER — LIDOCAINE HCL (PF) 2 % IJ SOLN
INTRAOCULAR | Status: DC | PRN
  Administered 2024-07-03: 4 mL via INTRAOCULAR

## 2024-07-03 MED ORDER — MIDAZOLAM HCL 2 MG/2ML IJ SOLN
INTRAMUSCULAR | Status: AC
  Filled 2024-07-03: qty 2

## 2024-07-03 MED ORDER — MOXIFLOXACIN HCL 0.5 % OP SOLN
OPHTHALMIC | Status: DC | PRN
Start: 2024-07-03 — End: 2024-07-03
  Administered 2024-07-03: .2 mL via OPHTHALMIC

## 2024-07-03 MED ORDER — FENTANYL CITRATE (PF) 100 MCG/2ML IJ SOLN
INTRAMUSCULAR | Status: AC
  Filled 2024-07-03: qty 2

## 2024-07-03 MED ORDER — ARMC OPHTHALMIC DILATING DROPS
1.0000 | OPHTHALMIC | Status: DC | PRN
  Administered 2024-07-03 (×3): 1 via OPHTHALMIC

## 2024-07-03 MED ORDER — TETRACAINE HCL 0.5 % OP SOLN
OPHTHALMIC | Status: AC
  Filled 2024-07-03: qty 4

## 2024-07-03 MED ORDER — FENTANYL CITRATE (PF) 100 MCG/2ML IJ SOLN
INTRAMUSCULAR | Status: DC | PRN
  Administered 2024-07-03 (×2): 50 ug via INTRAVENOUS

## 2024-07-03 MED ORDER — SIGHTPATH DOSE#1 NA HYALUR & NA CHOND-NA HYALUR IO KIT
PACK | INTRAOCULAR | Status: DC | PRN
  Administered 2024-07-03: 1 via OPHTHALMIC

## 2024-07-03 MED ORDER — DEXTROSE 50 % IV SOLN
12.0000 mL | Freq: Once | INTRAVENOUS | Status: AC
  Administered 2024-07-03: 12 mL via INTRAVENOUS

## 2024-07-03 MED ORDER — SIGHTPATH DOSE#1 BSS IO SOLN
INTRAOCULAR | Status: DC | PRN
  Administered 2024-07-03: 15 mL via INTRAOCULAR

## 2024-07-03 MED ORDER — MIDAZOLAM HCL 2 MG/2ML IJ SOLN
2.0000 mg | Freq: Once | INTRAMUSCULAR | Status: AC
  Administered 2024-07-03: 2 mg via INTRAVENOUS

## 2024-07-03 MED ORDER — BRIMONIDINE TARTRATE-TIMOLOL 0.2-0.5 % OP SOLN
OPHTHALMIC | Status: DC | PRN
  Administered 2024-07-03: 1 [drp] via OPHTHALMIC

## 2024-07-03 MED ORDER — DEXTROSE 50 % IV SOLN
INTRAVENOUS | Status: AC
  Filled 2024-07-03: qty 50

## 2024-07-03 MED ORDER — SIGHTPATH DOSE#1 BSS IO SOLN
INTRAOCULAR | Status: DC | PRN
  Administered 2024-07-03: 91 mL via OPHTHALMIC

## 2024-07-03 MED ORDER — ARMC OPHTHALMIC DILATING DROPS
OPHTHALMIC | Status: AC
  Filled 2024-07-03: qty 0.5

## 2024-07-03 MED ORDER — BUPIVACAINE LIPOSOME 1.3 % IJ SUSP
INTRAMUSCULAR | Status: AC
  Filled 2024-07-03: qty 20

## 2024-07-03 MED ORDER — TETRACAINE HCL 0.5 % OP SOLN
1.0000 [drp] | OPHTHALMIC | Status: DC | PRN
  Administered 2024-07-03 (×3): 1 [drp] via OPHTHALMIC

## 2024-07-03 SURGICAL SUPPLY — 10 items
DISSECTOR HYDRO NUCLEUS 50X22 (MISCELLANEOUS) ×1 IMPLANT
DRSG TEGADERM 2-3/8X2-3/4 SM (GAUZE/BANDAGES/DRESSINGS) ×1 IMPLANT
FEE CATARACT SUITE SIGHTPATH (MISCELLANEOUS) ×1 IMPLANT
GLOVE BIOGEL PI IND STRL 8 (GLOVE) ×1 IMPLANT
GLOVE SURG LX STRL 7.5 STRW (GLOVE) ×1 IMPLANT
GLOVE SURG SYN 6.5 PF PI BL (GLOVE) ×1 IMPLANT
LENS IOL TECNIS MONO 14.0 (Intraocular Lens) IMPLANT
NDL FILTER BLUNT 18X1 1/2 (NEEDLE) ×1 IMPLANT
NEEDLE FILTER BLUNT 18X1 1/2 (NEEDLE) ×1 IMPLANT
SYR 3ML LL SCALE MARK (SYRINGE) ×1 IMPLANT
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
## 2024-07-03 NOTE — Anesthesia Postprocedure Evaluation (Signed)
 Anesthesia Post Note  Patient: Angela Mullen  Procedure(s) Performed: PHACOEMULSIFICATION, CATARACT, WITH IOL INSERTION 6.66 00:56.3 (Right: Eye)  Patient location during evaluation: PACU Anesthesia Type: MAC Level of consciousness: awake and alert Pain management: pain level controlled Vital Signs Assessment: post-procedure vital signs reviewed and stable Respiratory status: spontaneous breathing, nonlabored ventilation, respiratory function stable and patient connected to nasal cannula oxygen  Cardiovascular status: stable and blood pressure returned to baseline Postop Assessment: no apparent nausea or vomiting Anesthetic complications: no   No notable events documented.   Last Vitals:  Vitals:   07/03/24 1018 07/03/24 1024  BP: 114/70 122/70  Pulse: (!) 58 (!) 58  Resp: 17 17  Temp: (!) 36.3 C (!) 36.3 C  SpO2: 96% 97%    Last Pain:  Vitals:   07/03/24 1024  TempSrc:   PainSc: 0-No pain                 Meztli Llanas C Kristen Fromm

## 2024-07-03 NOTE — Op Note (Signed)
 OPERATIVE NOTE  Angela Mullen [id number] 07/03/2024   PREOPERATIVE DIAGNOSIS: Nuclear sclerotic cataract right eye. H25.11   POSTOPERATIVE DIAGNOSIS: Nuclear sclerotic cataract right eye. H25.11   PROCEDURE:  Phacoemulsification with posterior chamber intraocular lens placement of the right eye  Ultrasound time: Procedure(s): PHACOEMULSIFICATION, CATARACT, WITH IOL INSERTION 6.66 00:56.3 (Right)  LENS:   Implant Name Type Inv. Item Serial No. Manufacturer Lot No. LRB No. Used Action  LENS IOL TECNIS MONO 14.0 - [id number] Intraocular Lens LENS IOL TECNIS MONO 14.0 [id number] S[employer]  Right 1 Implanted      SURGEON:  Feliciano HERO. Enola, MD   ANESTHESIA:  Topical with tetracaine drops, augmented with 1% preservative-free intracameral lidocaine .   COMPLICATIONS:  None.   DESCRIPTION OF PROCEDURE:  The patient was identified in the holding room and transported to the operating room and placed in the supine position under the operating microscope.  The right eye was identified as the operative eye, which was prepped and draped in the usual sterile ophthalmic fashion.   A 1 millimeter clear-corneal paracentesis was made superotemporally. Preservative-free 1% lidocaine  mixed with 1:1,000 bisulfite-free aqueous solution of epinephrine  was injected into the anterior chamber. The anterior chamber was then filled with Viscoat viscoelastic. A 2.4 millimeter keratome was used to make a clear-corneal incision inferotemporally. A curvilinear capsulorrhexis was made with a cystotome and capsulorrhexis forceps. Balanced salt solution was used to hydrodissect and hydrodelineate the nucleus. Phacoemulsification was then used to remove the lens nucleus and epinucleus. The remaining cortex was then removed using the irrigation and aspiration handpiece. Provisc was then placed into the capsular bag to distend it for lens placement. A +14.00 D DCB00 intraocular lens was then injected into the capsular bag.  The remaining viscoelastic was aspirated.   Wounds were hydrated with balanced salt solution.  The anterior chamber was inflated to a physiologic pressure with balanced salt solution.  No wound leaks were noted. Moxifloxacin was injected intracamerally.  Timolol and Brimonidine drops were applied to the eye.  The patient was taken to the recovery room in stable condition without complications of anesthesia or surgery.  [employer] 07/03/2024, 10:16 AM

## 2024-07-03 NOTE — Telephone Encounter (Signed)
 Requested medication (s) are due for refill today: yes  Requested medication (s) are on the active medication list: yes  Last refill:  01/02/24  Future visit scheduled: yes  Notes to clinic:  Unable to refill per protocol, cannot delegate.      Requested Prescriptions  Pending Prescriptions Disp Refills   ARIPiprazole  (ABILIFY ) 5 MG tablet [Pharmacy Med Name: ARIPiprazole  5 MG Oral Tablet] 90 tablet 0    Sig: Take 1 tablet by mouth once daily     Not Delegated - Psychiatry:  Antipsychotics - Second Generation (Atypical) - aripiprazole  Failed - 07/03/2024  9:22 AM      Failed - This refill cannot be delegated      Failed - Lipid Panel in normal range within the last 12 months    Cholesterol, Total  Date Value Ref Range Status  05/21/2024 129 100 - 199 mg/dL Final   LDL Chol Calc (NIH)  Date Value Ref Range Status  05/21/2024 62 0 - 99 mg/dL Final   HDL  Date Value Ref Range Status  05/21/2024 53 >39 mg/dL Final   Triglycerides  Date Value Ref Range Status  05/21/2024 65 0 - 149 mg/dL Final         Passed - TSH in normal range and within 360 days    TSH  Date Value Ref Range Status  01/02/2024 2.000 0.450 - 4.500 uIU/mL Final         Passed - Completed PHQ-2 or PHQ-9 in the last 360 days      Passed - Last BP in normal range    BP Readings from Last 1 Encounters:  07/03/24 128/69         Passed - Last Heart Rate in normal range    Pulse Readings from Last 1 Encounters:  07/03/24 63         Passed - Valid encounter within last 6 months    Recent Outpatient Visits           1 month ago Senile purpura   [place] [facility] Melvin Pao, NP   5 months ago DM (diabetes mellitus), type 2 with complications [facility])   [place] [facility] Melvin Pao, NP   6 months ago Encounter for annual wellness exam in Medicare patient   [place] [facility] Melvin Pao, NP   8 months ago Depression, recurrent    [place] [facility] Melvin Pao, NP              Passed - CBC within normal limits and completed in the last 12 months    WBC  Date Value Ref Range Status  01/02/2024 5.0 3.4 - 10.8 x10E3/uL Final  04/29/2020 10.7 (H) 4.0 - 10.5 K/uL Final   RBC  Date Value Ref Range Status  01/02/2024 4.03 3.77 - 5.28 x10E6/uL Final  04/29/2020 4.09 3.87 - 5.11 MIL/uL Final   Hemoglobin  Date Value Ref Range Status  01/02/2024 12.3 11.1 - 15.9 g/dL Final   Hematocrit  Date Value Ref Range Status  01/02/2024 38.1 34.0 - 46.6 % Final   MCHC  Date Value Ref Range Status  01/02/2024 32.3 31.5 - 35.7 g/dL Final  91/94/7978 66.8 30.0 - 36.0 g/dL Final   [facility]  Date Value Ref Range Status  01/02/2024 30.5 26.6 - 33.0 pg Final  04/29/2020 30.1 26.0 - 34.0 pg Final   MCV  Date Value Ref Range Status  01/02/2024 95 79 - 97  fL Final  05/11/2013 89 80 - 100 fL Final   No results found for: PLTCOUNTKUC, LABPLAT, POCPLA RDW  Date Value Ref Range Status  01/02/2024 12.5 11.7 - 15.4 % Final  05/11/2013 14.1 11.5 - 14.5 % Final         Passed - CMP within normal limits and completed in the last 12 months    Albumin  Date Value Ref Range Status  05/21/2024 3.9 3.9 - 4.9 g/dL Final   Alkaline Phosphatase  Date Value Ref Range Status  05/21/2024 110 44 - 121 IU/L Final   ALT  Date Value Ref Range Status  05/21/2024 17 0 - 32 IU/L Final   AST  Date Value Ref Range Status  05/21/2024 20 0 - 40 IU/L Final   BUN  Date Value Ref Range Status  05/21/2024 22 8 - 27 mg/dL Final  91/82/7985 20 (H) 7 - 18 mg/dL Final   Calcium  Date Value Ref Range Status  05/21/2024 8.7 8.7 - 10.3 mg/dL Final   Calcium, Total  Date Value Ref Range Status  05/11/2013 9.4 8.5 - 10.1 mg/dL Final   CO2  Date Value Ref Range Status  05/21/2024 21 20 - 29 mmol/L Final   Co2  Date Value Ref Range Status  05/11/2013 29 21 - 32 mmol/L Final   Creatinine  Date Value Ref  Range Status  05/11/2013 0.97 0.60 - 1.30 mg/dL Final   Creatinine, Ser  Date Value Ref Range Status  05/21/2024 1.69 (H) 0.57 - 1.00 mg/dL Final   Glucose  Date Value Ref Range Status  05/21/2024 66 (L) 70 - 99 mg/dL Final  91/82/7985 888 (H) 65 - 99 mg/dL Final   Glucose, Bld  Date Value Ref Range Status  04/29/2020 136 (H) 70 - 99 mg/dL Final    Comment:    Glucose reference range applies only to samples taken after fasting for at least 8 hours.   Glucose-Capillary  Date Value Ref Range Status  07/03/2024 79 70 - 99 mg/dL Final    Comment:    Glucose reference range applies only to samples taken after fasting for at least 8 hours.   Potassium  Date Value Ref Range Status  05/21/2024 3.7 3.5 - 5.2 mmol/L Final  05/11/2013 4.6 3.5 - 5.1 mmol/L Final   Sodium  Date Value Ref Range Status  05/21/2024 142 134 - 144 mmol/L Final  05/11/2013 139 136 - 145 mmol/L Final   Bilirubin Total  Date Value Ref Range Status  05/21/2024 0.3 0.0 - 1.2 mg/dL Final   Bilirubin, Direct  Date Value Ref Range Status  06/04/2018 0.12 0.00 - 0.40 mg/dL Final   Protein, ur  Date Value Ref Range Status  04/16/2020 NEGATIVE NEGATIVE mg/dL Final   Protein,UA  Date Value Ref Range Status  01/02/2024 1+ (A) Negative/Trace Final   Total Protein  Date Value Ref Range Status  05/21/2024 5.6 (L) 6.0 - 8.5 g/dL Final   EGFR (African American)  Date Value Ref Range Status  05/11/2013 >60  Final   GFR calc Af Amer  Date Value Ref Range Status  04/29/2020 46 (L) >60 mL/min Final   eGFR  Date Value Ref Range Status  05/21/2024 33 (L) >59 mL/min/1.73 Final   EGFR (Non-African Amer.)  Date Value Ref Range Status  05/11/2013 >60  Final    Comment:    eGFR values <35mL/min/1.73 m2 may be an indication of chronic kidney disease (CKD). Calculated eGFR is useful in  patients with stable renal function. The eGFR calculation will not be reliable in acutely ill patients when serum creatinine  is changing rapidly. It is not useful in  patients on dialysis. The eGFR calculation may not be applicable to patients at the low and high extremes of body sizes, pregnant women, and vegetarians.    GFR calc non Af Amer  Date Value Ref Range Status  04/29/2020 40 (L) >60 mL/min Final

## 2024-07-03 NOTE — Transfer of Care (Signed)
 Immediate Anesthesia Transfer of Care Note  Patient: Angela Mullen  Procedure(s) Performed: PHACOEMULSIFICATION, CATARACT, WITH IOL INSERTION 6.66 00:56.3 (Right: Eye)  Patient Location: PACU  Anesthesia Type: No value filed.  Level of Consciousness: awake, alert  and patient cooperative  Airway and Oxygen  Therapy: Patient Spontanous Breathing   Post-op Assessment: Post-op Vital signs reviewed, Patient's Cardiovascular Status Stable, Respiratory Function Stable, Patent Airway and No signs of Nausea or vomiting  Post-op Vital Signs: Reviewed and stable  Complications: No notable events documented.

## 2024-07-10 NOTE — Anesthesia Preprocedure Evaluation (Addendum)
 Anesthesia Evaluation    Airway Mallampati: IV  TM Distance: <3 FB     Dental no notable dental hx.    Pulmonary           Cardiovascular hypertension,   10-18-20 echo      1. Left ventricular ejection fraction, by estimation, is 55 to 60%. The  left ventricle has normal function. The left ventricle has no regional  wall motion abnormalities. Left ventricular diastolic parameters are  consistent with Grade I diastolic  dysfunction (impaired relaxation). The average left ventricular global  longitudinal strain is -19.7 %. The global longitudinal strain is normal.   2. Right ventricular systolic function is normal. The right ventricular  size is normal. There is normal pulmonary artery systolic pressure.   3. Left atrial size was mildly dilated.   4. The mitral valve is normal in structure. No evidence of mitral valve  regurgitation. No evidence of mitral stenosis.   5. The aortic valve has an indeterminant number of cusps but likely  tricuspid. Aortic valve regurgitation is mild. Mild to moderate aortic  valve sclerosis/calcification is present, without any evidence of aortic  stenosis.   6. The inferior vena cava is normal in size with greater than 50%  respiratory variability, suggesting right atrial pressure of 3 mmHg.    Comparison(s): Prior Echo (Care Everywhere) showed LV EF 55%, normal LV  and RV function, mild AI, mild AS (mean gradient 13 mmHg).       Neuro/Psych    GI/Hepatic   Endo/Other  diabetes    Renal/GU      Musculoskeletal   Abdominal   Peds  Hematology   Anesthesia Other Findings Previous cataract 07-03-24 Dr. Ola  High blood pressure  Fatty liver High cholesterol  Acid reflux COPD (chronic obstructive pulmonary disease) (HCC)  Elevation of level of transaminase or lactic acid dehydrogenase (LDH) Abnormal liver enzymes  Allergy Anxiety  Arthritis Cataract  Depression Sleep  apnea  Dyspnea Diabetes mellitus without complication Headache Anemia  Morbid obesity with body mass index (BMI) of 40.0 to 49.9 (HCC) Stage 3b chronic kidney disease  Grade I diastolic dysfunction OSA on CPAP     Reproductive/Obstetrics                              Anesthesia Physical Anesthesia Plan  ASA: 3  Anesthesia Plan: MAC   Post-op Pain Management:    Induction: Intravenous  PONV Risk Score and Plan:   Airway Management Planned: Natural Airway and Nasal Cannula  Additional Equipment:   Intra-op Plan:   Post-operative Plan:   Informed Consent: I have reviewed the patients History and Physical, chart, labs and discussed the procedure including the risks, benefits and alternatives for the proposed anesthesia with the patient or authorized representative who has indicated his/her understanding and acceptance.     Dental Advisory Given  Plan Discussed with: Anesthesiologist, CRNA and Surgeon  Anesthesia Plan Comments: (Patient consented for risks of anesthesia including but not limited to:  - adverse reactions to medications - damage to eyes, teeth, lips or other oral mucosa - nerve damage due to positioning  - sore throat or hoarseness - Damage to heart, brain, nerves, lungs, other parts of body or loss of life  Patient voiced understanding and assent.)         Anesthesia Quick Evaluation

## 2024-07-11 NOTE — Progress Notes (Unsigned)
   07/11/2024  Patient ID: Angela Mullen, female   DOB: 12-14-1955, 68 y.o.   MRN: [id number]  This patient is appearing on a report for being at risk of failing the adherence measure for hypertension (ACEi/ARB) medications this calendar year.   Medication: Lisinopril  20 mg tablets Last fill date: 02/21/24 for 90 day supply  MyChart message sent to patient.   [name] [facility] PharmD Candidate Class of [phone]

## 2024-07-14 ENCOUNTER — Encounter: Payer: Self-pay | Admitting: Ophthalmology

## 2024-07-15 ENCOUNTER — Other Ambulatory Visit: Payer: Self-pay

## 2024-07-15 NOTE — Progress Notes (Signed)
 Pharmacy Quality Measure Review  This patient is appearing on a report for being at risk of failing the adherence measure for hypertension (ACEi/ARB) medications this calendar year.   Medication: lisinopril  20 mg Last fill date: 02/21/24 for 90 day supply; refill requested 07/15/24 for 90 day supply  Spoke to patient and she noted that she was out of refills. She provided me with consent to call [facility] on her behalf to order refills. She also requested that her medications be put on auto-refill to avoid future delays in medications.  Jenkins Graces, PharmD PGY1 Pharmacy Resident [phone]

## 2024-07-17 ENCOUNTER — Encounter: Payer: Self-pay | Admitting: Anesthesiology

## 2024-07-17 ENCOUNTER — Ambulatory Visit: Admit: 2024-07-17 | Admitting: Ophthalmology

## 2024-07-17 SURGERY — PHACOEMULSIFICATION, CATARACT, WITH IOL INSERTION
Anesthesia: Topical | Laterality: Left

## 2024-07-22 NOTE — Progress Notes (Unsigned)
   07/22/2024  Patient ID: Angela Mullen, female   DOB: June 07, 1956, 68 y.o.   MRN: [id number]  This patient is appearing on a report for being at risk of failing the adherence measure for hypertension (ACEi/ARB) medications this calendar year.   Medication: lisinopril  20 mg tablets Last fill date: 07/15/24 for 90 day supply  Insurance report was not up to date. No action needed at this time.   Caide Campi C. Yarisa L[facility] PharmD Candidate Class of [phone]

## 2024-07-28 ENCOUNTER — Ambulatory Visit: Payer: Self-pay

## 2024-07-28 DIAGNOSIS — I10 Essential (primary) hypertension: Secondary | ICD-10-CM | POA: Diagnosis not present

## 2024-07-28 DIAGNOSIS — E663 Overweight: Secondary | ICD-10-CM | POA: Diagnosis not present

## 2024-07-28 DIAGNOSIS — E1122 Type 2 diabetes mellitus with diabetic chronic kidney disease: Secondary | ICD-10-CM | POA: Diagnosis not present

## 2024-07-28 DIAGNOSIS — N1832 Chronic kidney disease, stage 3b: Secondary | ICD-10-CM | POA: Diagnosis not present

## 2024-07-28 DIAGNOSIS — G629 Polyneuropathy, unspecified: Secondary | ICD-10-CM | POA: Diagnosis not present

## 2024-07-28 DIAGNOSIS — Z9884 Bariatric surgery status: Secondary | ICD-10-CM | POA: Diagnosis not present

## 2024-07-28 DIAGNOSIS — R809 Proteinuria, unspecified: Secondary | ICD-10-CM | POA: Diagnosis not present

## 2024-07-28 DIAGNOSIS — M1000 Idiopathic gout, unspecified site: Secondary | ICD-10-CM | POA: Diagnosis not present

## 2024-07-28 DIAGNOSIS — E785 Hyperlipidemia, unspecified: Secondary | ICD-10-CM | POA: Diagnosis not present

## 2024-07-28 NOTE — Telephone Encounter (Signed)
 This RN made second attempt to contact patient with no answer. This RN received the message, The mailbox is full and cannot receive any new messages at this time.

## 2024-07-28 NOTE — Telephone Encounter (Signed)
 This RN made second attempt to contact patient with no answer.
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
## 2024-07-30 ENCOUNTER — Ambulatory Visit: Payer: Self-pay

## 2024-07-30 NOTE — Telephone Encounter (Signed)
 Scheduled for 11/10. Patient is was not able to get a ride to come sooner.

## 2024-07-30 NOTE — Telephone Encounter (Signed)
 Called [facility] to advise them of ER Refusal
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
## 2024-07-30 NOTE — Telephone Encounter (Signed)
 Patient needs an appointment. Please call and schedule ASAP.

## 2024-08-04 ENCOUNTER — Encounter: Payer: Self-pay | Admitting: Nurse Practitioner

## 2024-08-04 ENCOUNTER — Ambulatory Visit: Admitting: Nurse Practitioner

## 2024-08-04 ENCOUNTER — Ambulatory Visit
Admission: RE | Admit: 2024-08-04 | Discharge: 2024-08-04 | Disposition: A | Discharge status: Home / Self Care | Source: Ambulatory Visit | Attending: Nurse Practitioner | Admitting: Nurse Practitioner

## 2024-08-04 VITALS — BP 109/70 | HR 65 | Temp 97.4°F | Ht 64.0 in | Wt 226.4 lb

## 2024-08-04 DIAGNOSIS — R0789 Other chest pain: Secondary | ICD-10-CM | POA: Diagnosis not present

## 2024-08-04 DIAGNOSIS — D229 Melanocytic nevi, unspecified: Secondary | ICD-10-CM | POA: Diagnosis not present

## 2024-08-04 DIAGNOSIS — Z23 Encounter for immunization: Secondary | ICD-10-CM | POA: Diagnosis not present

## 2024-08-04 DIAGNOSIS — R0781 Pleurodynia: Secondary | ICD-10-CM | POA: Diagnosis not present

## 2024-08-04 MED ORDER — TRAMADOL HCL 50 MG PO TABS: 50.0000 mg | ORAL_TABLET | Freq: Three times a day (TID) | ORAL | 0 refills | Status: AC | PRN
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
## 2024-08-05 MED ORDER — CLOTRIMAZOLE-BETAMETHASONE 1-0.05 % EX CREA: TOPICAL_CREAM | Freq: Two times a day (BID) | CUTANEOUS | 1 refills | Status: DC

## 2024-08-05 NOTE — Addendum Note (Signed)
 Addended by: MELVIN PAO on: 08/05/2024 08:15 AM   Modules accepted: Orders

## 2024-08-05 NOTE — Telephone Encounter (Signed)
 Prescription sent to the pharmacy.

## 2024-08-07 ENCOUNTER — Ambulatory Visit: Payer: Self-pay | Admitting: Nurse Practitioner

## 2024-08-08 ENCOUNTER — Other Ambulatory Visit: Payer: Self-pay | Admitting: Nurse Practitioner

## 2024-08-08 ENCOUNTER — Telehealth: Payer: Self-pay | Admitting: Nurse Practitioner

## 2024-08-08 NOTE — Telephone Encounter (Signed)
 Copied from CRM #[id number]. Topic: Clinical - Prescription Issue >> Aug 08, 2024  9:59 AM Angela Mullen wrote: Reason for CRM: Patient is stating [facility] does not have her prescription clotrimazole -betamethasone  (LOTRISONE ) cream

## 2024-08-10 NOTE — Telephone Encounter (Signed)
 Requested medications are due for refill today.  Rx was printed  Requested medications are on the active medications list.  08/05/2024 30g 0 rf - yes  Last refill. 08/05/2024  Future visit scheduled.   yes  Notes to clinic.  Rx was printed.    Requested Prescriptions  Pending Prescriptions Disp Refills   clotrimazole -betamethasone  (LOTRISONE ) cream [Pharmacy Med Name: Clotrimazole -Betamethasone  1-0.05 % External Cream] 30 g 0    Sig: APPLY  CREAM TOPICALLY TWICE DAILY AS NEEDED     Off-Protocol Failed - 08/10/2024  2:09 PM      Failed - Medication not assigned to a protocol, review manually.      Passed - Valid encounter within last 12 months    Recent Outpatient Visits           6 days ago Rib pain   [place] [facility] Melvin Pao, NP   2 months ago Senile purpura   [place] [facility] Melvin Pao, NP   6 months ago DM (diabetes mellitus), type 2 with complications [facility])   [place] [facility] Melvin Pao, NP   7 months ago Encounter for annual wellness exam in Medicare patient   [place] [facility] Melvin Pao, NP   9 months ago Depression, recurrent    [facility] Melvin Pao, NP       Future Appointments             In 1 week Raymund, Lauraine BROCKS, MD [facility]

## 2024-08-11 NOTE — Telephone Encounter (Signed)
 Patient has been called and a message left for them to return the call to the office. Ok for E2C2 to review if/when they return the call.  Please let patient know since this medication is not controlled she can call around to different pharmacies to find out who has it in stock and then ask they trnasfer it from the [facility].

## 2024-08-11 NOTE — Telephone Encounter (Signed)
 Please find out an alternative pharmacy this could be sent to.

## 2024-08-20 ENCOUNTER — Other Ambulatory Visit: Payer: Self-pay

## 2024-08-20 ENCOUNTER — Ambulatory Visit

## 2024-08-20 DIAGNOSIS — L820 Inflamed seborrheic keratosis: Secondary | ICD-10-CM | POA: Diagnosis not present

## 2024-08-20 DIAGNOSIS — D489 Neoplasm of uncertain behavior, unspecified: Secondary | ICD-10-CM | POA: Diagnosis not present

## 2024-08-20 DIAGNOSIS — L821 Other seborrheic keratosis: Secondary | ICD-10-CM

## 2024-08-20 DIAGNOSIS — C4492 Squamous cell carcinoma of skin, unspecified: Secondary | ICD-10-CM

## 2024-08-20 DIAGNOSIS — D0472 Carcinoma in situ of skin of left lower limb, including hip: Secondary | ICD-10-CM | POA: Diagnosis not present

## 2024-08-20 DIAGNOSIS — W908XXA Exposure to other nonionizing radiation, initial encounter: Secondary | ICD-10-CM | POA: Diagnosis not present

## 2024-08-20 DIAGNOSIS — L814 Other melanin hyperpigmentation: Secondary | ICD-10-CM | POA: Diagnosis not present

## 2024-08-20 DIAGNOSIS — Z1283 Encounter for screening for malignant neoplasm of skin: Secondary | ICD-10-CM

## 2024-08-20 DIAGNOSIS — L578 Other skin changes due to chronic exposure to nonionizing radiation: Secondary | ICD-10-CM | POA: Diagnosis not present

## 2024-08-20 HISTORY — DX: Squamous cell carcinoma of skin, unspecified: C44.92

## 2024-08-20 NOTE — Progress Notes (Signed)
 Subjective   Angela Mullen is a 68 y.o. female who presents for the following: Lesion(s) of concern . Patient is new patient.  Today patient reports: Patient states a dry patch on left cheek. She has been using alcohol to area has been there for about 9 months. She says she could have the same issue on left back of calf that has been there for a year.    Review of Systems:    No other skin or systemic complaints except as noted in HPI or Assessment and Plan.  The following portions of the chart were reviewed this encounter and updated as appropriate: medications, allergies, medical history  Relevant Medical History:  n/a   Objective  (SKPE) Well appearing patient in no apparent distress; mood and affect are within normal limits. Examination was performed of the: Focused Exam of: Left face, left lower extremity . Examination notable for: SKIN EXAM, Lentigo/lentigines: Scattered pigmented macules that are tan to brown in color and are somewhat non-uniform in shape and concentrated in the sun-exposed areas, Seborrheic Keratosis(es): Stuck-on appearing keratotic papule(s) on the trunk, some  irritated with redness, crusting, edema, and/or partial avulsion, Actinic Damage/Elastosis: chronic sun damage: dyspigmentation, telangiectasia, and wrinkling. - 5 mm firm pink papule at left posterior lower extremity. Examination limited by: Undergarments, Shoes or socks , and Clothing   Left posterior lower extremity 5 mm firm pink papule   Assessment & Plan  (SKAP)   SKIN CANCER SCREENING PERFORMED TODAY.  BENIGN SKIN FINDINGS  - Lentigines  - Seborrheic keratoses - Reassurance provided regarding the benign appearance of lesions noted on exam today; no treatment is indicated in the absence of symptoms/changes. - Reinforced importance of photoprotective strategies including liberal and frequent sunscreen use of a broad-spectrum SPF 30 or greater, use of protective clothing, and sun  avoidance for prevention of cutaneous malignancy and photoaging.  Counseled patient on the importance of regular self-skin monitoring as well as routine clinical skin examinations as scheduled.   ACTINIC DAMAGE - Chronic condition, secondary to cumulative UV/sun exposure - Recommend daily broad spectrum sunscreen SPF 30+ to sun-exposed areas, reapply every 2 hours as needed.  - Staying in the shade or wearing long sleeves, sun glasses (UVA+UVB protection) and wide brim hats (4-inch brim around the entire circumference of the hat) are also recommended for sun protection.  - Call for new or changing lesions.  Level of service outlined above   Patient instructions (SKPI)   Procedures, orders, diagnosis for this visit:  INFLAMED SEBORRHEIC KERATOSIS left cheek x1 Destruction of lesion - left cheek x1 Complexity: simple   Destruction method: cryotherapy   Informed consent: discussed and consent obtained   Timeout:  patient name, date of birth, surgical site, and procedure verified Lesion destroyed using liquid nitrogen: Yes   Region frozen until ice ball extended beyond lesion: Yes   Cryo cycles: 1 or 2. Outcome: patient tolerated procedure well with no complications   Post-procedure details: wound care instructions given   Additional details:  Prior to procedure, discussed risks of blister formation, small wound, skin dyspigmentation, or rare scar following cryotherapy. Recommend Vaseline ointment to treated areas while healing.   NEOPLASM OF UNCERTAIN BEHAVIOR Left posterior lower extremity Skin / nail biopsy Type of biopsy: tangential   Informed consent: discussed and consent obtained   Timeout: patient name, date of birth, surgical site, and procedure verified   Procedure prep:  Patient was prepped and draped in usual sterile fashion Prep type:  Isopropyl alcohol Anesthesia: the lesion was anesthetized in a standard fashion   Anesthetic:  1% lidocaine  w/ epinephrine  1-100,000  buffered w/ 8.4% NaHCO3 Instrument used: DermaBlade   Hemostasis achieved with: pressure and aluminum chloride   Outcome: patient tolerated procedure well   Post-procedure details: sterile dressing applied and wound care instructions given   Dressing type: bandage and petrolatum    Specimen 1 - Surgical pathology Differential Diagnosis: BCC vs SCC vs KA  Check Margins: No 5 mm firm pink papule  Inflamed seborrheic keratosis -     Destruction of lesion  Neoplasm of uncertain behavior -     Skin / nail biopsy -     Surgical pathology; Standing    Return to clinic: Return for 6 months - 1 year TBSE w/ Dr. Raymund.  I, Almetta Nora, RMA, am acting as scribe for Lauraine JAYSON Raymund, MD .  Documentation: I have reviewed the above documentation for accuracy and completeness, and I agree with the above.  Lauraine JAYSON Raymund, MD

## 2024-08-20 NOTE — Patient Instructions (Addendum)
 Cryosurgery  Cryosurgery ("freezing") uses liquid nitrogen to destroy certain types of skin lesions. Lowering the temperature of the lesion in a small area surrounding skin destroys the lesion. Immediately following cryosurgery, you will notice redness and swelling of the treatment area. Blistering or weeping may occur, lasting approximately one week which will then be followed by crusting. Most areas will heal completely in 10 to 14 days.  Wash the treated areas daily. Allow soap and water to run over the areas, but do not scrub. Should a scab or crust form, allow it to fall off on its own. Do not remove or pick at it. Application of an ointment  and a bandage may make you feel more comfortable, but it is not necessary. Some people develop an allergy to Neosporin, so we recommend that Vaseline or  Aquaphor be used.  The cryotherapy site will be more sensitive than your surrounding skin. Keep it covered, and remember to apply sunscreen every day to all your sun exposed skin. A scar may remain which is lighter or pinker than your normal skin. Your body will continue to improve your scar for up to one year; however a light-colored scar may remain.  Infection following cryotherapy is rare. However if you are worried about the appearance of the treated area, contact your doctor. We have a physician on call at all times. If you have any concerns about the site, please call our clinic at [phone]    The bandage should remain in place until tomorrow. Remove the bandages and clean the wound once daily as follows:  - Wash your hands. Clean the wound gently with soap and water, and then pat dry. Do not rub. Apply a small amount of Petroleum Jelly or Vaseline. Cover the area with a Band-Aid.  A small amount of bleeding is normal. If bleeding persists, apply firm pressure over the bandage for 5 to 10 minutes without interruption. If bleeding continues, call our office. Continue to clean the area as directed  above until the wound is healed. Shave biopsies may take several weeks to heal. It is normal if the edges are pink/red and the center is slight yellowish or white in color. However if the site becomes hot, swollen, has a thick drainage or redness that expands away from the site please call us . We will contact you with results once available.    Due to recent changes in healthcare laws, you may see results of your pathology and/or laboratory studies on MyChart before the doctors have had a chance to review them. We understand that in some cases there may be results that are confusing or concerning to you. Please understand that not all results are received at the same time and often the doctors may need to interpret multiple results in order to provide you with the best plan of care or course of treatment. Therefore, we ask that you please give us  2 business days to thoroughly review all your results before contacting the office for clarification. Should we see a critical lab result, you will be contacted sooner.   If You Need Anything After Your Visit  If you have any questions or concerns for your doctor, please call our main line at [phone] and press option 4 to reach your doctor's medical assistant. If no one answers, please leave a voicemail as directed and we will return your call as soon as possible. Messages left after 4 pm will be answered the following business day.   You may also  send us  a message via MyChart. We typically respond to MyChart messages within 1-2 business days.  For prescription refills, please ask your pharmacy to contact our office. Our fax number is [phone].  If you have an urgent issue when the clinic is closed that cannot wait until the next business day, you can page your doctor at the number below.    Please note that while we do our best to be available for urgent issues outside of office hours, we are not available 24/7.   If you have an urgent issue and  are unable to reach us , you may choose to seek medical care at your doctor's office, retail clinic, urgent care center, or emergency room.  If you have a medical emergency, please immediately call 911 or go to the emergency department.  Pager Numbers  - Dr. Hester: [phone]  - Dr. Jackquline: [phone]  - Dr. Claudene: [phone]   - Dr. Raymund: [phone]  In the event of inclement weather, please call our main line at [phone] for an update on the status of any delays or closures.  Dermatology Medication Tips: Please keep the boxes that topical medications come in in order to help keep track of the instructions about where and how to use these. Pharmacies typically print the medication instructions only on the boxes and not directly on the medication tubes.   If your medication is too expensive, please contact our office at [phone] option 4 or send us  a message through MyChart.   We are unable to tell what your co-pay for medications will be in advance as this is different depending on your insurance coverage. However, we may be able to find a substitute medication at lower cost or fill out paperwork to get insurance to cover a needed medication.   If a prior authorization is required to get your medication covered by your insurance company, please allow us  1-2 business days to complete this process.  Drug prices often vary depending on where the prescription is filled and some pharmacies may offer cheaper prices.  The website www.goodrx.com contains coupons for medications through different pharmacies. The prices here do not account for what the cost may be with help from insurance (it may be cheaper with your insurance), but the website can give you the price if you did not use any insurance.  - You can print the associated coupon and take it with your prescription to the pharmacy.  - You may also stop by our office during regular business hours and pick up a GoodRx  coupon card.  - If you need your prescription sent electronically to a different pharmacy, notify our office through [facility] or by phone at [phone] option 4.     Si Usted Necesita Algo Despus de Su Visita  Tambin puede enviarnos un mensaje a travs de Clinical Cytogeneticist. Por lo general respondemos a los mensajes de MyChart en el transcurso de 1 a 2 das hbiles.  Para renovar recetas, por favor pida a su farmacia que se ponga en contacto con nuestra oficina. Randi lakes de fax es [place] [phone].  Si tiene un asunto urgente cuando la clnica est cerrada y que no puede esperar hasta el siguiente da hbil, puede llamar/localizar a su doctor(a) al nmero que aparece a continuacin.   Por favor, tenga en cuenta que aunque hacemos todo lo posible para estar disponibles para asuntos urgentes fuera del horario de [place], no estamos disponibles las 24 horas del da, los [address] de  la semana.   Si tiene un problema urgente y no puede comunicarse con nosotros, puede optar por buscar atencin mdica  en el consultorio de su doctor(a), en una clnica privada, en un centro de atencin urgente o en una sala de emergencias.  Si tiene engineer, drilling, por favor llame inmediatamente al 911 o vaya a la sala de emergencias.  Nmeros de bper  - Dr. Hester: [phone]  - Dra. Jackquline: [phone]  - Dr. Claudene: [phone]  - Dra. Kitts: [phone]  En caso de inclemencias del [place], por favor llame a nuestra lnea principal al [phone] para una actualizacin sobre el estado de cualquier retraso o cierre.  Consejos para la medicacin en dermatologa: Por favor, guarde las cajas en las que vienen los medicamentos de uso tpico para ayudarle a seguir las instrucciones sobre dnde y cmo usarlos. Las farmacias generalmente imprimen las instrucciones del medicamento slo en las cajas y no directamente en los tubos del [place].   Si su medicamento es muy caro, por favor,  pngase en contacto con landry rieger llamando al [phone] y presione la opcin 4 o envenos un mensaje a travs de Clinical Cytogeneticist.   No podemos decirle cul ser su copago por los medicamentos por adelantado ya que esto es diferente dependiendo de la cobertura de su seguro. Sin embargo, es posible que podamos encontrar un medicamento sustituto a audiological scientist un formulario para que el seguro cubra el medicamento que se considera necesario.   Si se requiere una autorizacin previa para que su compaa de seguros cubra su medicamento, por favor permtanos de 1 a 2 das hbiles para completar este proceso.  Los precios de los medicamentos varan con frecuencia dependiendo del environmental consultant de dnde se surte la receta y alguna farmacias pueden ofrecer precios ms baratos.  El sitio web www.goodrx.com tiene cupones para medicamentos de health and safety inspector. Los precios aqu no tienen en cuenta lo que podra costar con la ayuda del seguro (puede ser ms barato con su seguro), pero el sitio web puede darle el precio si no utiliz tourist information centre manager.  - Puede imprimir el cupn correspondiente y llevarlo con su receta a la farmacia.  - Tambin puede pasar por nuestra oficina durante el horario de atencin regular y education officer, museum una tarjeta de cupones de GoodRx.  - Si necesita que su receta se enve electrnicamente a una farmacia diferente, informe a nuestra oficina a travs de MyChart de [place] o por telfono llamando al [phone] y presione la opcin 4.

## 2024-08-25 LAB — DERMATOLOGY PATHOLOGY

## 2024-08-26 ENCOUNTER — Other Ambulatory Visit: Payer: Self-pay | Admitting: Nurse Practitioner

## 2024-08-26 ENCOUNTER — Ambulatory Visit: Payer: Self-pay

## 2024-08-26 NOTE — Progress Notes (Signed)
 LMTRC

## 2024-08-26 NOTE — Telephone Encounter (Signed)
Patient advised of BX results and scheduled for EDC. aw 

## 2024-08-28 NOTE — Telephone Encounter (Signed)
 Requested Prescriptions  Pending Prescriptions Disp Refills   traZODone  (DESYREL ) 100 MG tablet [Pharmacy Med Name: traZODone  HCl 100 MG Oral Tablet] 180 tablet 0    Sig: TAKE 2 TABLETS BY MOUTH AT BEDTIME AS NEEDED FOR SLEEP     Psychiatry: Antidepressants - Serotonin Modulator Passed - 08/28/2024  4:24 PM      Passed - Completed PHQ-2 or PHQ-9 in the last 360 days      Passed - Valid encounter within last 6 months    Recent Outpatient Visits           3 weeks ago Rib pain   [place] [facility] Melvin Pao, NP   3 months ago Senile purpura   [place] [facility]vin [facility], NP   6 months ago DM (diabetes mellitus), type 2 with complications [facility])   [place] [facility] Melvin Pao, NP   7 months ago Encounter for annual wellness exam in Medicare patient   [place] [facility] Melvin Pao, NP   10 months ago Depression, recurrent   [place] [facility] Melvin Pao, NP       Future Appointments             In 1 week Raymund, Lauraine BROCKS, MD [facility]   In 5 months Raymund, Lauraine BROCKS, MD [facility]

## 2024-09-04 ENCOUNTER — Ambulatory Visit

## 2024-09-30 ENCOUNTER — Telehealth: Payer: Self-pay | Admitting: Pharmacy Technician

## 2024-09-30 ENCOUNTER — Other Ambulatory Visit (HOSPITAL_COMMUNITY): Payer: Self-pay

## 2024-09-30 MED ORDER — ACCU-CHEK GUIDE TEST VI STRP: 1.0000 | ORAL_STRIP | Freq: Every day | 3 refills | Status: AC

## 2024-09-30 MED ORDER — ACCU-CHEK SOFTCLIX LANCETS MISC: 1.0000 | Freq: Every day | 12 refills | Status: AC

## 2024-09-30 MED ORDER — ACCU-CHEK GUIDE W/DEVICE KIT: 1.0000 | PACK | Freq: Every day | 0 refills | Status: AC

## 2024-09-30 NOTE — Telephone Encounter (Signed)
 Prescriptions t'd up for a preferred brand.

## 2024-09-30 NOTE — Telephone Encounter (Signed)
 Prescriptions sent to the pharmacy.

## 2024-09-30 NOTE — Telephone Encounter (Signed)
 Pharmacy Patient Advocate Encounter   Received notification from [facility] KEY that prior authorization for OneTouch Ultra strips is required/requested.   Insurance verification completed.   The patient is insured through [facility] ADVANTAGE/RX ADVANCE.   Per test claim:  Accu-check and Contour is preferred by the insurance.  If suggested medication is appropriate, Please send in a new RX and discontinue this one. If not, please advise as to why it's not appropriate so that we may request a Prior Authorization. Please note, some preferred medications may still require a PA.  If the suggested medications have not been trialed and there are no contraindications to their use, the PA will not be submitted, as it will not be approved.  This is a formulary change for 2026.   Cmm Key# [id number]

## 2024-10-21 ENCOUNTER — Other Ambulatory Visit: Payer: Self-pay | Admitting: Cardiology

## 2024-10-27 ENCOUNTER — Other Ambulatory Visit: Payer: Self-pay | Admitting: Nurse Practitioner

## 2024-10-28 NOTE — Telephone Encounter (Signed)
 Requested medication (s) are due for refill today: yes  Requested medication (s) are on the active medication list: yes  Last refill:  08/11/24  Future visit scheduled: yes  Notes to clinic:  Medication not assigned to a protocol, review manually.      Requested Prescriptions  Pending Prescriptions Disp Refills   clotrimazole -betamethasone  (LOTRISONE ) cream [Pharmacy Med Name: Clotrimazole -Betamethasone  1-0.05 % External Cream] 30 g 0    Sig: APPLY CREAM TOPICALLY TWICE DAILY AS NEEDED     Off-Protocol Failed - 10/28/2024  3:27 PM      Failed - Medication not assigned to a protocol, review manually.      Passed - Valid encounter within last 12 months    Recent Outpatient Visits           2 months ago Rib pain   [place] [facility] Melvin Pao, NP   5 months ago Senile purpura   [facility] Melvin Pao, NP   8 months ago DM (diabetes mellitus), type 2 with complications [facility])   [place] [facility] Melvin Pao, NP   10 months ago Encounter for annual wellness exam in Medicare patient   [place] [facility], Pao, NP   1 year ago Depression, recurrent   [place] [facility] Melvin Pao, NP       Future Appointments             In 3 months Raymund, Lauraine BROCKS, MD [facility] [place] [facility]            Signed Prescriptions Disp Refills   DULoxetine  (CYMBALTA ) 60 MG capsule 90 capsule 0    Sig: Take 1 capsule by mouth at bedtime     Psychiatry: Antidepressants - SNRI - duloxetine  Failed - 10/28/2024  3:27 PM      Failed - Cr in normal range and within 360 days    Creatinine  Date Value Ref Range Status  05/11/2013 0.97 0.60 - 1.30 mg/dL Final   Creatinine, Ser  Date Value Ref Range Status  05/21/2024 1.69 (H) 0.57 - 1.00 mg/dL Final         Passed - eGFR is 30 or above and within 360 days    EGFR (African American)  Date Value Ref Range  Status  05/11/2013 >60  Final   GFR calc Af Amer  Date Value Ref Range Status  04/29/2020 46 (L) >60 mL/min Final   EGFR (Non-African Amer.)  Date Value Ref Range Status  05/11/2013 >60  Final    Comment:    eGFR values <66mL/min/1.73 m2 may be an indication of chronic kidney disease (CKD). Calculated eGFR is useful in patients with stable renal function. The eGFR calculation will not be reliable in acutely ill patients when serum creatinine is changing rapidly. It is not useful in  patients on dialysis. The eGFR calculation may not be applicable to patients at the low and high extremes of body sizes, pregnant women, and vegetarians.    GFR calc non Af Amer  Date Value Ref Range Status  04/29/2020 40 (L) >60 mL/min Final   eGFR  Date Value Ref Range Status  05/21/2024 33 (L) >59 mL/min/1.73 Final         Passed - Completed PHQ-2 or PHQ-9 in the last 360 days      Passed - Last BP in normal range    BP Readings from Last 1 Encounters:  08/04/24 109/70  Passed - Valid encounter within last 6 months    Recent Outpatient Visits           2 months ago Rib pain   [place] [facility] Mel[facility] Pao, NP   5 months ago Senile purpura   [facility] Mel[facility] Pao, NP   8 months ago DM (diabetes mellitus), type 2 with complications [facility])   [place] [facility] Melvin Pao, NP   10 months ago Encounter for annual wellness exam in Medicare patient   [place] [facility], Pao, NP   1 year ago Depression, recurrent   [place] [facility] Melvin Pao, NP       Future Appointments             In 3 months Raymund, Lauraine BROCKS, MD [place] [facility]             DULoxetine  (CYMBALTA ) 30 MG capsule 90 capsule 0    Sig: Take 1 capsule by mouth at bedtime     Psychiatry: Antidepressants - SNRI - duloxetine  Failed - 10/28/2024  3:27 PM       Failed - Cr in normal range and within 360 days    Creatinine  Date Value Ref Range Status  05/11/2013 0.97 0.60 - 1.30 mg/dL Final   Creatinine, Ser  Date Value Ref Range Status  05/21/2024 1.69 (H) 0.57 - 1.00 mg/dL Final         Passed - eGFR is 30 or above and within 360 days    EGFR (African American)  Date Value Ref Range Status  05/11/2013 >60  Final   GFR calc Af Amer  Date Value Ref Range Status  04/29/2020 46 (L) >60 mL/min Final   EGFR (Non-African Amer.)  Date Value Ref Range Status  05/11/2013 >60  Final    Comment:    eGFR values <4mL/min/1.73 m2 may be an indication of chronic kidney disease (CKD). Calculated eGFR is useful in patients with stable renal function. The eGFR calculation will not be reliable in acutely ill patients when serum creatinine is changing rapidly. It is not useful in  patients on dialysis. The eGFR calculation may not be applicable to patients at the low and high extremes of body sizes, pregnant women, and vegetarians.    GFR calc non Af Amer  Date Value Ref Range Status  04/29/2020 40 (L) >60 mL/min Final   eGFR  Date Value Ref Range Status  05/21/2024 33 (L) >59 mL/min/1.73 Final         Passed - Completed PHQ-2 or PHQ-9 in the last 360 days      Passed - Last BP in normal range    BP Readings from Last 1 Encounters:  08/04/24 109/70         Passed - Valid encounter within last 6 months    Recent Outpatient Visits           2 months ago Rib pain   [place] [facility] Pao, NP   5 months ago Senile purpura   [place] [facility] Pao, NP   8 months ago DM (diabetes mellitus), type 2 with complications [facility])    [facility]   10 months ago Encounter for annual wellness exam in Medicare patient    [facility] Mel[facility] Pa[facility], NP   1 year ago Depression, recurrent   [facility] Mel[facility] Pao, NP       Future Appointments             In 3 months Raymund, Lauraine BROCKS, MD [facility] [place]

## 2024-11-05 ENCOUNTER — Ambulatory Visit

## 2024-11-25 ENCOUNTER — Encounter: Admitting: Nurse Practitioner

## 2025-01-13 ENCOUNTER — Ambulatory Visit: Admitting: Cardiovascular Disease

## 2025-01-27 ENCOUNTER — Ambulatory Visit

## 2025-02-19 ENCOUNTER — Ambulatory Visit
